# Patient Record
Sex: Female | Born: 1950
Health system: Southern US, Community
[De-identification: ages and names within clinical notes are randomized; demographics above are authoritative.]

## PROBLEM LIST (undated history)

## (undated) DIAGNOSIS — M1990 Unspecified osteoarthritis, unspecified site: Secondary | ICD-10-CM

## (undated) DIAGNOSIS — L97921 Non-pressure chronic ulcer of unspecified part of left lower leg limited to breakdown of skin: Secondary | ICD-10-CM

## (undated) DIAGNOSIS — C679 Malignant neoplasm of bladder, unspecified: Secondary | ICD-10-CM

## (undated) DIAGNOSIS — L089 Local infection of the skin and subcutaneous tissue, unspecified: Secondary | ICD-10-CM

## (undated) DIAGNOSIS — Z86718 Personal history of other venous thrombosis and embolism: Secondary | ICD-10-CM

## (undated) HISTORY — PX: ABDOMINAL HYSTERECTOMY: SHX81

## (undated) HISTORY — DX: Non-pressure chronic ulcer of unspecified part of left lower leg limited to breakdown of skin: L97.921

## (undated) HISTORY — DX: Local infection of the skin and subcutaneous tissue, unspecified: L08.9

## (undated) HISTORY — DX: Personal history of other venous thrombosis and embolism: Z86.718

---

## 1986-05-14 DIAGNOSIS — Z9079 Acquired absence of other genital organ(s): Secondary | ICD-10-CM | POA: Insufficient documentation

## 1987-05-15 DIAGNOSIS — Z86718 Personal history of other venous thrombosis and embolism: Secondary | ICD-10-CM

## 1999-03-03 ENCOUNTER — Encounter: Payer: Self-pay | Admitting: Emergency Medicine

## 1999-03-03 ENCOUNTER — Emergency Department (HOSPITAL_COMMUNITY): Admission: EM | Admit: 1999-03-03 | Discharge: 1999-03-03 | Payer: Self-pay | Admitting: Emergency Medicine

## 1999-09-17 ENCOUNTER — Emergency Department (HOSPITAL_COMMUNITY): Admission: EM | Admit: 1999-09-17 | Discharge: 1999-09-17 | Payer: Self-pay | Admitting: Emergency Medicine

## 2001-05-20 DIAGNOSIS — S62109A Fracture of unspecified carpal bone, unspecified wrist, initial encounter for closed fracture: Secondary | ICD-10-CM | POA: Insufficient documentation

## 2001-11-23 ENCOUNTER — Emergency Department (HOSPITAL_COMMUNITY): Admission: EM | Admit: 2001-11-23 | Discharge: 2001-11-23 | Payer: Self-pay | Admitting: Emergency Medicine

## 2001-11-23 ENCOUNTER — Encounter: Payer: Self-pay | Admitting: Emergency Medicine

## 2003-10-25 ENCOUNTER — Ambulatory Visit (HOSPITAL_COMMUNITY): Admission: RE | Admit: 2003-10-25 | Discharge: 2003-10-25 | Payer: Self-pay | Admitting: Family Medicine

## 2003-11-08 ENCOUNTER — Emergency Department (HOSPITAL_COMMUNITY): Admission: EM | Admit: 2003-11-08 | Discharge: 2003-11-08 | Payer: Self-pay | Admitting: Family Medicine

## 2004-03-06 ENCOUNTER — Ambulatory Visit: Payer: Self-pay | Admitting: Family Medicine

## 2004-03-06 ENCOUNTER — Ambulatory Visit: Payer: Self-pay | Admitting: *Deleted

## 2004-04-03 ENCOUNTER — Ambulatory Visit: Payer: Self-pay | Admitting: Family Medicine

## 2004-05-09 ENCOUNTER — Ambulatory Visit: Payer: Self-pay | Admitting: Family Medicine

## 2004-06-05 ENCOUNTER — Ambulatory Visit: Payer: Self-pay | Admitting: Family Medicine

## 2004-07-17 ENCOUNTER — Ambulatory Visit: Payer: Self-pay | Admitting: Family Medicine

## 2004-07-24 ENCOUNTER — Ambulatory Visit (HOSPITAL_COMMUNITY): Admission: RE | Admit: 2004-07-24 | Discharge: 2004-07-24 | Payer: Self-pay | Admitting: Family Medicine

## 2004-07-24 ENCOUNTER — Emergency Department (HOSPITAL_COMMUNITY): Admission: EM | Admit: 2004-07-24 | Discharge: 2004-07-24 | Payer: Self-pay | Admitting: Family Medicine

## 2004-07-25 ENCOUNTER — Ambulatory Visit (HOSPITAL_COMMUNITY): Admission: RE | Admit: 2004-07-25 | Discharge: 2004-07-25 | Payer: Self-pay | Admitting: Family Medicine

## 2004-08-07 ENCOUNTER — Emergency Department (HOSPITAL_COMMUNITY): Admission: EM | Admit: 2004-08-07 | Discharge: 2004-08-07 | Payer: Self-pay | Admitting: Family Medicine

## 2004-08-14 ENCOUNTER — Ambulatory Visit: Payer: Self-pay | Admitting: Family Medicine

## 2004-08-17 ENCOUNTER — Ambulatory Visit (HOSPITAL_COMMUNITY): Admission: RE | Admit: 2004-08-17 | Discharge: 2004-08-17 | Payer: Self-pay | Admitting: Family Medicine

## 2004-08-31 ENCOUNTER — Ambulatory Visit: Payer: Self-pay | Admitting: Nurse Practitioner

## 2004-09-11 ENCOUNTER — Ambulatory Visit: Payer: Self-pay | Admitting: Family Medicine

## 2004-10-16 ENCOUNTER — Ambulatory Visit: Payer: Self-pay | Admitting: Family Medicine

## 2004-11-13 ENCOUNTER — Ambulatory Visit: Payer: Self-pay | Admitting: Family Medicine

## 2004-12-18 ENCOUNTER — Ambulatory Visit: Payer: Self-pay | Admitting: Family Medicine

## 2005-01-22 ENCOUNTER — Ambulatory Visit: Payer: Self-pay | Admitting: Family Medicine

## 2005-02-12 ENCOUNTER — Ambulatory Visit: Payer: Self-pay | Admitting: Nurse Practitioner

## 2005-02-26 ENCOUNTER — Ambulatory Visit: Payer: Self-pay | Admitting: Family Medicine

## 2005-03-12 ENCOUNTER — Ambulatory Visit: Payer: Self-pay | Admitting: Family Medicine

## 2005-04-03 ENCOUNTER — Ambulatory Visit: Payer: Self-pay | Admitting: Family Medicine

## 2005-04-09 ENCOUNTER — Ambulatory Visit: Payer: Self-pay | Admitting: Family Medicine

## 2005-04-23 ENCOUNTER — Ambulatory Visit: Payer: Self-pay | Admitting: Internal Medicine

## 2005-05-21 ENCOUNTER — Ambulatory Visit: Payer: Self-pay | Admitting: Family Medicine

## 2005-05-28 ENCOUNTER — Ambulatory Visit: Payer: Self-pay | Admitting: Family Medicine

## 2005-06-11 ENCOUNTER — Ambulatory Visit: Payer: Self-pay | Admitting: Family Medicine

## 2005-07-17 ENCOUNTER — Ambulatory Visit: Payer: Self-pay | Admitting: Family Medicine

## 2005-08-20 ENCOUNTER — Ambulatory Visit: Payer: Self-pay | Admitting: Family Medicine

## 2005-08-20 ENCOUNTER — Encounter (INDEPENDENT_AMBULATORY_CARE_PROVIDER_SITE_OTHER): Payer: Self-pay | Admitting: Family Medicine

## 2005-08-27 ENCOUNTER — Ambulatory Visit: Payer: Self-pay | Admitting: Family Medicine

## 2005-09-10 ENCOUNTER — Ambulatory Visit (HOSPITAL_COMMUNITY): Admission: RE | Admit: 2005-09-10 | Discharge: 2005-09-10 | Payer: Self-pay | Admitting: Family Medicine

## 2005-09-10 ENCOUNTER — Ambulatory Visit: Payer: Self-pay | Admitting: Internal Medicine

## 2005-10-01 ENCOUNTER — Ambulatory Visit: Payer: Self-pay | Admitting: Internal Medicine

## 2005-10-29 ENCOUNTER — Ambulatory Visit: Payer: Self-pay | Admitting: Internal Medicine

## 2005-11-11 ENCOUNTER — Encounter (INDEPENDENT_AMBULATORY_CARE_PROVIDER_SITE_OTHER): Payer: Self-pay | Admitting: Family Medicine

## 2005-11-11 LAB — CONVERTED CEMR LAB: Pap Smear: NORMAL

## 2005-11-26 ENCOUNTER — Ambulatory Visit: Payer: Self-pay | Admitting: Family Medicine

## 2005-12-24 ENCOUNTER — Ambulatory Visit: Payer: Self-pay | Admitting: Internal Medicine

## 2006-02-18 ENCOUNTER — Ambulatory Visit: Payer: Self-pay | Admitting: Family Medicine

## 2006-03-04 ENCOUNTER — Ambulatory Visit: Payer: Self-pay | Admitting: Family Medicine

## 2006-03-18 ENCOUNTER — Ambulatory Visit: Payer: Self-pay | Admitting: Family Medicine

## 2006-04-15 ENCOUNTER — Ambulatory Visit: Payer: Self-pay | Admitting: Family Medicine

## 2006-05-27 ENCOUNTER — Ambulatory Visit: Payer: Self-pay | Admitting: Family Medicine

## 2006-06-24 ENCOUNTER — Ambulatory Visit: Payer: Self-pay | Admitting: Family Medicine

## 2006-07-22 ENCOUNTER — Ambulatory Visit: Payer: Self-pay | Admitting: Family Medicine

## 2006-08-19 ENCOUNTER — Ambulatory Visit: Payer: Self-pay | Admitting: Family Medicine

## 2006-09-16 ENCOUNTER — Ambulatory Visit: Payer: Self-pay | Admitting: Family Medicine

## 2006-10-03 ENCOUNTER — Encounter (INDEPENDENT_AMBULATORY_CARE_PROVIDER_SITE_OTHER): Payer: Self-pay | Admitting: Family Medicine

## 2006-10-03 DIAGNOSIS — M1990 Unspecified osteoarthritis, unspecified site: Secondary | ICD-10-CM

## 2007-01-29 ENCOUNTER — Encounter (INDEPENDENT_AMBULATORY_CARE_PROVIDER_SITE_OTHER): Payer: Self-pay | Admitting: *Deleted

## 2007-02-06 ENCOUNTER — Emergency Department (HOSPITAL_COMMUNITY): Admission: EM | Admit: 2007-02-06 | Discharge: 2007-02-06 | Payer: Self-pay | Admitting: Emergency Medicine

## 2007-12-30 ENCOUNTER — Ambulatory Visit (HOSPITAL_COMMUNITY): Admission: RE | Admit: 2007-12-30 | Discharge: 2007-12-30 | Payer: Self-pay | Admitting: Urology

## 2008-02-21 ENCOUNTER — Emergency Department (HOSPITAL_COMMUNITY): Admission: EM | Admit: 2008-02-21 | Discharge: 2008-02-21 | Payer: Self-pay | Admitting: Emergency Medicine

## 2008-04-27 ENCOUNTER — Ambulatory Visit (HOSPITAL_COMMUNITY): Admission: RE | Admit: 2008-04-27 | Discharge: 2008-04-27 | Payer: Self-pay | Admitting: Sports Medicine

## 2008-05-05 ENCOUNTER — Ambulatory Visit (HOSPITAL_COMMUNITY): Admission: RE | Admit: 2008-05-05 | Discharge: 2008-05-05 | Payer: Self-pay | Admitting: Sports Medicine

## 2008-05-28 ENCOUNTER — Inpatient Hospital Stay (HOSPITAL_COMMUNITY): Admission: AD | Admit: 2008-05-28 | Discharge: 2008-05-28 | Payer: Self-pay | Admitting: Obstetrics & Gynecology

## 2008-05-28 ENCOUNTER — Ambulatory Visit: Payer: Self-pay | Admitting: Physician Assistant

## 2008-05-30 ENCOUNTER — Emergency Department (HOSPITAL_COMMUNITY): Admission: EM | Admit: 2008-05-30 | Discharge: 2008-05-30 | Payer: Self-pay | Admitting: Emergency Medicine

## 2008-06-28 ENCOUNTER — Encounter (INDEPENDENT_AMBULATORY_CARE_PROVIDER_SITE_OTHER): Payer: Self-pay | Admitting: Urology

## 2008-06-28 ENCOUNTER — Ambulatory Visit (HOSPITAL_COMMUNITY): Admission: RE | Admit: 2008-06-28 | Discharge: 2008-06-28 | Payer: Self-pay | Admitting: Urology

## 2009-05-21 ENCOUNTER — Emergency Department (HOSPITAL_COMMUNITY): Admission: EM | Admit: 2009-05-21 | Discharge: 2009-05-21 | Payer: Self-pay | Admitting: Family Medicine

## 2009-06-06 ENCOUNTER — Emergency Department (HOSPITAL_COMMUNITY): Admission: EM | Admit: 2009-06-06 | Discharge: 2009-06-06 | Payer: Self-pay | Admitting: Emergency Medicine

## 2009-06-11 ENCOUNTER — Emergency Department (HOSPITAL_COMMUNITY): Admission: EM | Admit: 2009-06-11 | Discharge: 2009-06-11 | Payer: Self-pay | Admitting: Family Medicine

## 2010-06-04 ENCOUNTER — Encounter: Payer: Self-pay | Admitting: Family Medicine

## 2010-06-04 ENCOUNTER — Encounter: Payer: Self-pay | Admitting: Oncology

## 2010-06-30 IMAGING — NM NM BONE 3 PHASE
2 series · 2 of 2 positions shown · non-contrast
Comparison: None

CLINICAL DATA: mid systemic arthropathy.  Bilateral foot pain.
Assess for stress fracture.

NUCLEAR MEDICINE THREE PHASE BONE SCAN
TECHNIQUE: After intravenous administration of radiopharmeceutical,
immediate flow images were performed by immediate / early static
images and approximate three hour delayed static images.
Radiopharmaceutical: 25.3 mCi technetium MDP IV

[3 phase bone · 2.33mm/px · 1 of 1 slices shown (1 of 2)]
[im 1/1]
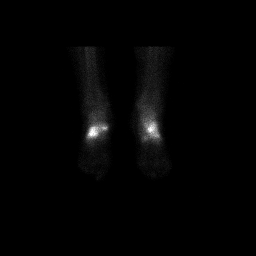

[3 phase bone · 2.33mm/px · 1 of 1 slices shown (2 of 2)]
[im 1/1]
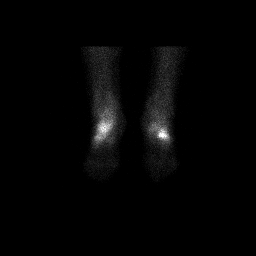

[2 of 2 positions shown; findings below may reference images not displayed]

FINDINGS: Increased tracer activity involving the mid feet on the
dynamic flow, early blood pool, and delayed static images.  The
tracer activity involves the tarsal regions and may involve the
anterior aspect of the left calcaneus as well.  No significant
asymmetrical tracer activity to suggest a stress fracture.  I do
not have plain radiographs for correlation at this time.
IMPRESSION: Bilateral rather symmetrical increased tracer accumulation
involving the tarsal regions of the feet.  No findings to strongly
suggest presence of a stress fracture.

## 2010-08-28 LAB — URINALYSIS, ROUTINE W REFLEX MICROSCOPIC
Ketones, ur: NEGATIVE mg/dL
Nitrite: NEGATIVE
Nitrite: POSITIVE — AB
Protein, ur: 100 mg/dL — AB
Specific Gravity, Urine: <1.005 — ABNORMAL LOW (ref 1.005–1.030)
Specific Gravity, Urine: 1.017 (ref 1.005–1.030)
Urobilinogen, UA: 1 mg/dL (ref 0.0–1.0)
pH: 8 (ref 5.0–8.0)

## 2010-08-28 LAB — POCT I-STAT, CHEM 8
BUN: 37 mg/dL — ABNORMAL HIGH (ref 6–23)
Chloride: 106 mEq/L (ref 96–112)
Glucose, Bld: 98 mg/dL (ref 70–99)
HCT: 39 % (ref 36.0–46.0)
Potassium: 4 mEq/L (ref 3.5–5.1)

## 2010-08-28 LAB — PROTIME-INR: INR: 1.1 (ref 0.00–1.49)

## 2010-08-28 LAB — CBC
Hemoglobin: 12.6 g/dL (ref 12.0–15.0)
MCHC: 32.5 g/dL (ref 30.0–36.0)
MCV: 93.6 fL (ref 78.0–100.0)
RBC: 4.15 MIL/uL (ref 3.87–5.11)
WBC: 8.2 10*3/uL (ref 4.0–10.5)

## 2010-08-28 LAB — URINE MICROSCOPIC-ADD ON

## 2010-08-28 LAB — DIFFERENTIAL
Eosinophils Absolute: 0.1 10*3/uL (ref 0.0–0.7)
Lymphs Abs: 2.3 10*3/uL (ref 0.7–4.0)
Monocytes Absolute: 0.6 10*3/uL (ref 0.1–1.0)
Monocytes Relative: 7 % (ref 3–12)
Neutrophils Relative %: 63 % (ref 43–77)

## 2010-08-28 LAB — URINE CULTURE: Colony Count: >=100000

## 2010-08-28 LAB — GC/CHLAMYDIA PROBE AMP, GENITAL
Chlamydia, DNA Probe: NEGATIVE
GC Probe Amp, Genital: NEGATIVE

## 2010-08-28 LAB — APTT: aPTT: 30 seconds (ref 24–37)

## 2010-08-28 LAB — WET PREP, GENITAL: Yeast Wet Prep HPF POC: NONE SEEN

## 2010-08-29 LAB — HEMOGLOBIN AND HEMATOCRIT, BLOOD: Hemoglobin: 13.1 g/dL (ref 12.0–15.0)

## 2010-09-26 NOTE — Op Note (Signed)
NAMESUETTA, Shelia Berg NO.:  [id number]   MEDICAL RECORD NO.:  [id number]          PATIENT TYPE:  AMB   LOCATION:  DAY                          FACILITY:  [facility]   PHYSICIAN:  Heloise Purpura, MD      DATE OF BIRTH:  02/19/1951   DATE OF PROCEDURE:  06/28/2008  DATE OF DISCHARGE:                               OPERATIVE REPORT   PREOPERATIVE DIAGNOSES:  1. Bladder tumor.  2. Hematuria.   POSTOPERATIVE DIAGNOSES:  1. Bladder tumor.  2. Hematuria.   PROCEDURES:  1. Cystoscopy.  2. Pelvic exam under anesthesia.  3. Saline bladder washing for cytology.  4. Bilateral retrograde pyelography.  5. Transurethral resection of bladder tumor (3 cm), bladder biopsies      from left lateral bladder wall and posterior bladder wall.   SURGEON:  Dr. Heloise Purpura.   ANESTHESIA:  General.   COMPLICATIONS:  None.   ESTIMATED BLOOD LOSS:  25 mL.   SPECIMENS:  1. Right lateral bladder tumor.  2. Biopsies deep margin of right lateral bladder tumor.  3. Left lateral bladder biopsy.  4. Posterior bladder biopsy.  5. Bladder washing for cytology.   DISPOSITION OF SPECIMENS:  To pathology.   DRAIN:  16-French Foley catheter.   INDICATION:  Shelia Berg is a 60 year old female who presented with gross  hematuria.  She underwent cystoscopy by Dr. Alfredo Martinez and was  noted to have a right lateral bladder tumor.  She was not able to go and  undergo a contrasted upper tract imaging study due to her history of an  IV contrast allergy.  After discussion regarding the potential risks,  complications, and alternative treatment options associated with the  above procedures, she consented to proceed for therapeutic and further  staging procedures as discussed above.  Informed consent was obtained.   DESCRIPTION OF PROCEDURE:  The patient was taken to the operating room  and a general anesthetic was administered.  She was paralyzed.  She was  given preoperative antibiotics, placed  in the dorsal lithotomy position,  prepped and draped in the usual sterile fashion.  Next preoperative time-  out was performed.  Cystourethroscopy was then performed with both the  12 and 70 degrees lenses.  This demonstrated a partially necrotic  papillary bladder tumor off the right lateral wall of the bladder.  In  addition, there was noted to be an area of the flat and slightly raised  erythematous tissue off the left lateral bladder wall as well as  posterior bladder wall.  No other obvious tumors were identified.  The  ureteral orifices were in normal anatomic position and the effluxing  clear urine.  A saline bladder washing was then obtained and sent for  cytology.  The right ureteral orifice was then intubated with a 6-French  ureteral catheter and Omnipaque contrast was injected with care not to  use excessive pressure considering her IV contrast allergy.  This  demonstrated a mildly dilated ureter without evidence of any filling  defects within the ureter or renal collecting system.  Attention then  turned to the  left ureteral orifice which was also intubated with a 6-  French ureteral catheter.  Again Omnipaque was injected and this  demonstrated a normal caliber ureter without evidence of hydronephrosis.  There were no filling defects within the left renal collecting system or  ureter.  Attention then returned to the bladder and the cystoscope was  removed.  A 26-French resectoscope was then placed into the bladder with  ease.  With water irrigation was utilized and transurethral resection of  the patient's right lateral bladder tumor was performed after it was  confirmed that she was indeed paralyzed.  The bladder tumor was removed  and sent for analysis.  A cold cup biopsy forceps was then used for  biopsies of the tumor base.  This area was then fulgurated with the  electrocautery loop.  The previously mentioned abnormal areas on the  left lateral bladder wall as well as  posterior bladder wall were also  biopsied multiple times with the cold cup biopsy forceps and these areas  were then again fulgurated with electrocautery loop.  The bladder was  emptied and reinspected ensuring hemostasis.  At this point the bladder  was emptied and a separate gloved finger was placed into the vagina and  pelvic exam under anesthesia was performed.  There was noted be some  thickening of the right lateral bladder wall though no definite mass was  noted and the bladder was freely mobile.  A 16-French Foley catheter was  then placed into the bladder.  40 mg of mitomycin in 40 mL of sterile  water was then instilled into the patient's bladder and a catheter plug  was placed.  She tolerated the procedure well without complications.  She was able to be awakened and transferred to recovery unit in  satisfactory condition.      Heloise Purpura, MD  Electronically Signed     LB/MEDQ  D:  06/28/2008  T:  06/28/2008  Job:  [id number]

## 2011-02-12 LAB — URINALYSIS, ROUTINE W REFLEX MICROSCOPIC
Leukocytes, UA: NEGATIVE
Protein, ur: 30 — AB
Specific Gravity, Urine: 1.023
Urobilinogen, UA: 0.2

## 2011-02-12 LAB — PROTIME-INR
INR: 2.1 — ABNORMAL HIGH
Prothrombin Time: 24.6 — ABNORMAL HIGH

## 2011-02-12 LAB — DIFFERENTIAL
Lymphs Abs: 1.9
Monocytes Relative: 6
Neutro Abs: 3
Neutrophils Relative %: 56

## 2011-02-12 LAB — URINE MICROSCOPIC-ADD ON

## 2011-02-12 LAB — COMPREHENSIVE METABOLIC PANEL
Albumin: 3.3 — ABNORMAL LOW
BUN: 20
Calcium: 8.6
Creatinine, Ser: 0.82
Total Bilirubin: 0.6
Total Protein: 6.2

## 2011-02-12 LAB — CBC
HCT: 36.7
MCHC: 33.3
MCV: 93.1
Platelets: 215
RDW: 13.3

## 2011-02-22 LAB — PROTIME-INR
INR: 2.2 — ABNORMAL HIGH
Prothrombin Time: 25.2 — ABNORMAL HIGH

## 2011-02-22 LAB — APTT: aPTT: 46 — ABNORMAL HIGH

## 2011-06-07 ENCOUNTER — Other Ambulatory Visit (HOSPITAL_COMMUNITY): Payer: Self-pay | Admitting: Urology

## 2011-06-07 DIAGNOSIS — C689 Malignant neoplasm of urinary organ, unspecified: Secondary | ICD-10-CM

## 2011-06-11 ENCOUNTER — Inpatient Hospital Stay (HOSPITAL_COMMUNITY): Admission: RE | Admit: 2011-06-11 | Payer: Self-pay | Source: Ambulatory Visit

## 2011-06-12 ENCOUNTER — Inpatient Hospital Stay (HOSPITAL_COMMUNITY): Admission: RE | Admit: 2011-06-12 | Payer: Self-pay | Source: Ambulatory Visit

## 2011-06-13 ENCOUNTER — Ambulatory Visit (HOSPITAL_COMMUNITY)
Admission: RE | Admit: 2011-06-13 | Discharge: 2011-06-13 | Disposition: A | Discharge status: Home / Self Care | Payer: Self-pay | Source: Ambulatory Visit | Attending: Urology | Admitting: Urology

## 2011-06-13 ENCOUNTER — Other Ambulatory Visit (HOSPITAL_COMMUNITY): Payer: Self-pay | Admitting: Urology

## 2011-06-13 DIAGNOSIS — C689 Malignant neoplasm of urinary organ, unspecified: Secondary | ICD-10-CM

## 2011-06-13 DIAGNOSIS — C679 Malignant neoplasm of bladder, unspecified: Secondary | ICD-10-CM | POA: Insufficient documentation

## 2011-06-13 DIAGNOSIS — Z90710 Acquired absence of both cervix and uterus: Secondary | ICD-10-CM | POA: Insufficient documentation

## 2011-06-13 DIAGNOSIS — N323 Diverticulum of bladder: Secondary | ICD-10-CM | POA: Insufficient documentation

## 2011-06-13 MED ORDER — FUROSEMIDE 10 MG/ML IJ SOLN
5.0000 mg | Freq: Once | INTRAMUSCULAR | Status: DC
  Filled 2011-06-13: qty 0.5

## 2013-03-25 ENCOUNTER — Ambulatory Visit (INDEPENDENT_AMBULATORY_CARE_PROVIDER_SITE_OTHER): Payer: BC Managed Care – PPO | Admitting: Physician Assistant

## 2013-03-25 VITALS — BP 124/72 | HR 78 | Temp 97.9°F | Resp 18 | Ht 62.25 in | Wt 143.2 lb

## 2013-03-25 DIAGNOSIS — J329 Chronic sinusitis, unspecified: Secondary | ICD-10-CM

## 2013-03-25 MED ORDER — GUAIFENESIN ER 1200 MG PO TB12: 1.0000 | ORAL_TABLET | Freq: Two times a day (BID) | ORAL | Status: AC

## 2013-03-25 MED ORDER — AMOXICILLIN 875 MG PO TABS: 1750.0000 mg | ORAL_TABLET | Freq: Two times a day (BID) | ORAL | Status: DC

## 2013-03-25 NOTE — Progress Notes (Signed)
    Subjective:    Patient ID: Shelia Berg, female    DOB: 10/19/1950, 62 y.o.   MRN: [id number]  HPI Pt presents to clinic with face pain, teeth pain and facial swelling that started 2 days ago.  2 weeks ago she had a cold that she used Mucinex for and her symptoms almost completely went away and then she started to develop facial pain and pressure - she wears dentures and is unable to wear them because of pain.    Review of Systems  Constitutional: Negative for fever and chills.  HENT: Positive for facial swelling and sinus pressure. Negative for congestion and rhinorrhea.   Neurological: Positive for headaches.       Objective:   Physical Exam  Vitals reviewed. Constitutional: She is oriented to person, place, and time. She appears well-developed and well-nourished.  HENT:  Head: Normocephalic and atraumatic.  Right Ear: Hearing, tympanic membrane, external ear and ear canal normal.  Left Ear: Hearing, tympanic membrane, external ear and ear canal normal.  Nose: Right sinus exhibits maxillary sinus tenderness and frontal sinus tenderness. Left sinus exhibits maxillary sinus tenderness and frontal sinus tenderness.  Eyes: Conjunctivae are normal.  Neck: Normal range of motion.  Cardiovascular: Normal rate, regular rhythm and normal heart sounds.   No murmur heard. Pulmonary/Chest: Effort normal and breath sounds normal. She has no wheezes.  Lymphadenopathy:    She has cervical adenopathy (Ac enalgred and TTP).  Neurological: She is alert and oriented to person, place, and time.  Skin: Skin is warm and dry.    Assessment & Plan:  Sinusitis - Plan: amoxicillin (AMOXIL) 875 MG tablet, Guaifenesin (MUCINEX MAXIMUM STRENGTH) 1200 MG TB12  Push fluids.  Benny Lennert PA-C 03/25/2013 9:20 PM

## 2013-08-01 ENCOUNTER — Ambulatory Visit (INDEPENDENT_AMBULATORY_CARE_PROVIDER_SITE_OTHER): Payer: BC Managed Care – PPO | Admitting: Family Medicine

## 2013-08-01 ENCOUNTER — Ambulatory Visit: Payer: BC Managed Care – PPO

## 2013-08-01 VITALS — BP 126/68 | HR 88 | Temp 98.2°F | Resp 16 | Wt 147.0 lb

## 2013-08-01 DIAGNOSIS — M79671 Pain in right foot: Secondary | ICD-10-CM

## 2013-08-01 DIAGNOSIS — M779 Enthesopathy, unspecified: Secondary | ICD-10-CM

## 2013-08-01 DIAGNOSIS — M79609 Pain in unspecified limb: Secondary | ICD-10-CM

## 2013-08-01 MED ORDER — PREDNISONE 20 MG PO TABS: 40.0000 mg | ORAL_TABLET | Freq: Every day | ORAL | Status: DC

## 2013-08-01 NOTE — Patient Instructions (Signed)
Tendinitis °Tendinitis is swelling and inflammation of the tendons. Tendons are band-like tissues that connect muscle to bone. Tendinitis commonly occurs in the:  °· Shoulders (rotator cuff). °· Heels (Achilles tendon). °· Elbows (triceps tendon). °CAUSES °Tendinitis is usually caused by overusing the tendon, muscles, and joints involved. When the tissue surrounding a tendon (synovium) becomes inflamed, it is called tenosynovitis. Tendinitis commonly develops in people whose jobs require repetitive motions. °SYMPTOMS °· Pain. °· Tenderness. °· Mild swelling. °DIAGNOSIS °Tendinitis is usually diagnosed by physical exam. Your caregiver may also order X-rays or other imaging tests. °TREATMENT °Your caregiver may recommend certain medicines or exercises for your treatment. °HOME CARE INSTRUCTIONS  °· Use a sling or splint for as long as directed by your caregiver until the pain decreases. °· Put ice on the injured area. °· Put ice in a plastic bag. °· Place a towel between your skin and the bag. °· Leave the ice on for 15-20 minutes, 03-04 times a day. °· Avoid using the limb while the tendon is painful. Perform gentle range of motion exercises only as directed by your caregiver. Stop exercises if pain or discomfort increase, unless directed otherwise by your caregiver. °· Only take over-the-counter or prescription medicines for pain, discomfort, or fever as directed by your caregiver. °SEEK MEDICAL CARE IF:  °· Your pain and swelling increase. °· You develop new, unexplained symptoms, especially increased numbness in the hands. °MAKE SURE YOU:  °· Understand these instructions. °· Will watch your condition. °· Will get help right away if you are not doing well or get worse. °Document Released: 04/27/2000 Document Revised: 07/23/2011 Document Reviewed: 07/17/2010 °Exit[employer]® Patient Information ©2014 [employer]. ° °

## 2013-08-01 NOTE — Progress Notes (Signed)
This chart was scribed for Robyn Haber, MD by Vernell Barrier, Medical Scribe. This patient's care was started at 5:56 PM.  Patient ID: Shelia Berg MRN: [id number], DOB: 21-Oct-1950, 63 y.o. Date of Encounter: 08/01/2013, 5:56 PM  Primary Physician: [name] Patient  Chief Complaint: right foot pain  HPI: 63 y.o. year old female with history below presents with right foot pain and associated swelling, onset 5 days ago. Pain worse with walking. No pain with palpation. States this pain has occurred before and went away with ibuprofen. this time pain did not subside even with ibuprofen but reports swelling did go down slightly. Pt also reports she had dental surgery 2 weeks ago. Pt works as a Librarian, academic in a hotel. Pt is from [place].   No past medical history on file.   Home Meds: Prior to Admission medications   Medication Sig Start Date End Date Taking? Authorizing Provider  Ascorbic Acid (VITAMIN C) 100 MG tablet Take 100 mg by mouth daily.   Yes Historical Provider, MD  aspirin 81 MG tablet Take 81 mg by mouth daily.   Yes Historical Provider, MD    Allergies: No Known Allergies  History   Social History   Marital Status: Married    Spouse Name: N/A    Number of Children: N/A   Years of Education: N/A   Occupational History   Not on file.   Social History Main Topics   Smoking status: Current Every Day Smoker   Smokeless tobacco: Not on file   Alcohol Use: No   Drug Use: No   Sexual Activity: Yes   Other Topics Concern   Not on file   Social History Narrative   No narrative on file     Review of Systems: Constitutional: negative for chills, fever, night sweats, weight changes, or fatigue  HEENT: negative for vision changes, hearing loss, congestion, rhinorrhea, ST, epistaxis, or sinus pressure Cardiovascular: negative for chest pain or palpitations Respiratory: negative for hemoptysis, wheezing, shortness of breath, or cough Abdominal: negative  for abdominal pain, nausea, vomiting, diarrhea, or constipation Dermatological: negative for rash Neurologic: negative for headache, dizziness, or syncope Musc: right foot pain and swelling All other systems reviewed and are otherwise negative with the exception to those above and in the HPI.   Physical Exam: Blood pressure 126/68, pulse 88, temperature 98.2 F (36.8 C), temperature source Oral, resp. rate 16, weight 147 lb (66.679 kg), SpO2 97.00%., Body mass index is 26.68 kg/(m^2). General: Well developed, well nourished, in no acute distress. Head: Normocephalic, atraumatic, eyes without discharge, sclera non-icteric, nares are without discharge. Bilateral auditory canals clear, TM's are without perforation, pearly grey and translucent with reflective cone of light bilaterally. Oral cavity moist, posterior pharynx without exudate, erythema, peritonsillar abscess, or post nasal drip.  Neck: Supple. No thyromegaly. Full ROM. No lymphadenopathy. Lungs: Clear bilaterally to auscultation without wheezes, rales, or rhonchi. Breathing is unlabored. Heart: RRR with S1 S2. No murmurs, rubs, or gallops appreciated. Abdomen: Soft, non-tender, non-distended with normoactive bowel sounds. No hepatomegaly. No rebound/guarding. No obvious abdominal masses. Msk:  Strength and tone normal for age. Extremities/Skin: Warm and dry. No clubbing or cyanosis. No edema. No rashes or suspicious lesions. Neuro: Alert and oriented X 3. Moves all extremities spontaneously. Gait is normal. CNII-XII grossly in tact. Psych:  Responds to questions appropriately with a normal affect.  [facility] reading (PRIMARY) by  Dr. Joseph Art:  Negative right foot.    ASSESSMENT AND PLAN:  63 y.o. year  old female with Foot pain, right - Plan: DG Foot Complete Right, predniSONE (DELTASONE) 20 MG tablet  Tendinitis - Plan: predniSONE (DELTASONE) 20 MG tablet     Signed, Robyn Haber, MD 08/01/2013 5:56 PM

## 2013-08-18 ENCOUNTER — Telehealth: Payer: Self-pay | Admitting: *Deleted

## 2013-08-18 ENCOUNTER — Other Ambulatory Visit: Payer: Self-pay | Admitting: Family Medicine

## 2013-08-18 DIAGNOSIS — M79671 Pain in right foot: Secondary | ICD-10-CM

## 2013-08-18 DIAGNOSIS — M779 Enthesopathy, unspecified: Secondary | ICD-10-CM

## 2013-08-18 MED ORDER — PREDNISONE 20 MG PO TABS: 40.0000 mg | ORAL_TABLET | Freq: Every day | ORAL | Status: DC

## 2013-08-18 NOTE — Telephone Encounter (Signed)
Pt stated she would like a refill on prednisone.  She is still in pain.  Would you like for pt to follow up or can we refill

## 2013-08-18 NOTE — Telephone Encounter (Signed)
Phone number out of service, but RF was sent to pharm

## 2013-08-18 NOTE — Telephone Encounter (Signed)
I ordered a refill on the prednisone

## 2014-06-06 ENCOUNTER — Ambulatory Visit: Payer: 59

## 2014-06-07 ENCOUNTER — Encounter (HOSPITAL_COMMUNITY): Payer: Self-pay | Admitting: Emergency Medicine

## 2014-06-07 ENCOUNTER — Inpatient Hospital Stay (HOSPITAL_COMMUNITY)
Admission: EM | Admit: 2014-06-07 | Discharge: 2014-06-10 | DRG: 190 | Disposition: A | Discharge status: Home / Self Care | Payer: 59 | Attending: Internal Medicine | Admitting: Internal Medicine

## 2014-06-07 ENCOUNTER — Emergency Department (HOSPITAL_COMMUNITY): Payer: Self-pay

## 2014-06-07 DIAGNOSIS — J189 Pneumonia, unspecified organism: Secondary | ICD-10-CM

## 2014-06-07 DIAGNOSIS — J154 Pneumonia due to other streptococci: Secondary | ICD-10-CM | POA: Diagnosis present

## 2014-06-07 DIAGNOSIS — F17210 Nicotine dependence, cigarettes, uncomplicated: Secondary | ICD-10-CM | POA: Diagnosis present

## 2014-06-07 DIAGNOSIS — E876 Hypokalemia: Secondary | ICD-10-CM

## 2014-06-07 DIAGNOSIS — R079 Chest pain, unspecified: Secondary | ICD-10-CM

## 2014-06-07 DIAGNOSIS — Z8551 Personal history of malignant neoplasm of bladder: Secondary | ICD-10-CM

## 2014-06-07 DIAGNOSIS — J441 Chronic obstructive pulmonary disease with (acute) exacerbation: Principal | ICD-10-CM | POA: Diagnosis present

## 2014-06-07 DIAGNOSIS — J9801 Acute bronchospasm: Secondary | ICD-10-CM | POA: Diagnosis present

## 2014-06-07 DIAGNOSIS — Z7982 Long term (current) use of aspirin: Secondary | ICD-10-CM

## 2014-06-07 HISTORY — DX: Malignant neoplasm of bladder, unspecified: C67.9

## 2014-06-07 LAB — CBC
HCT: 35.5 % — ABNORMAL LOW (ref 36.0–46.0)
HEMOGLOBIN: 11.6 g/dL — AB (ref 12.0–15.0)
MCH: 30.2 pg (ref 26.0–34.0)
MCHC: 32.7 g/dL (ref 30.0–36.0)
MCV: 92.4 fL (ref 78.0–100.0)
Platelets: 184 10*3/uL (ref 150–400)
RBC: 3.84 MIL/uL — ABNORMAL LOW (ref 3.87–5.11)
RDW: 12.2 % (ref 11.5–15.5)
WBC: 8.3 10*3/uL (ref 4.0–10.5)

## 2014-06-07 LAB — BRAIN NATRIURETIC PEPTIDE: B Natriuretic Peptide: 65.5 pg/mL (ref 0.0–100.0)

## 2014-06-07 LAB — BASIC METABOLIC PANEL
Anion gap: 10 (ref 5–15)
BUN: 10 mg/dL (ref 6–23)
CO2: 25 mmol/L (ref 19–32)
CREATININE: 1.04 mg/dL (ref 0.50–1.10)
Calcium: 8.3 mg/dL — ABNORMAL LOW (ref 8.4–10.5)
Chloride: 102 mmol/L (ref 96–112)
GFR calc Af Amer: 65 mL/min — ABNORMAL LOW (ref >90)
GFR calc non Af Amer: 56 mL/min — ABNORMAL LOW (ref >90)
GLUCOSE: 171 mg/dL — AB (ref 70–99)
POTASSIUM: 3.3 mmol/L — AB (ref 3.5–5.1)
Sodium: 137 mmol/L (ref 135–145)

## 2014-06-07 MED ORDER — POTASSIUM CHLORIDE CRYS ER 20 MEQ PO TBCR
40.0000 meq | EXTENDED_RELEASE_TABLET | Freq: Once | ORAL | Status: AC
  Administered 2014-06-07: 40 meq via ORAL
  Filled 2014-06-07: qty 2

## 2014-06-07 MED ORDER — IPRATROPIUM-ALBUTEROL 0.5-2.5 (3) MG/3ML IN SOLN
3.0000 mL | Freq: Four times a day (QID) | RESPIRATORY_TRACT | Status: DC
  Administered 2014-06-08: 3 mL via RESPIRATORY_TRACT
  Filled 2014-06-07: qty 3

## 2014-06-07 MED ORDER — IPRATROPIUM-ALBUTEROL 0.5-2.5 (3) MG/3ML IN SOLN
3.0000 mL | Freq: Once | RESPIRATORY_TRACT | Status: AC
  Administered 2014-06-07: 3 mL via RESPIRATORY_TRACT
  Filled 2014-06-07: qty 3

## 2014-06-07 MED ORDER — METHYLPREDNISOLONE SODIUM SUCC 125 MG IJ SOLR
125.0000 mg | Freq: Once | INTRAMUSCULAR | Status: AC
  Administered 2014-06-07: 125 mg via INTRAVENOUS
  Filled 2014-06-07: qty 2

## 2014-06-07 MED ORDER — CEFTRIAXONE SODIUM IN DEXTROSE 20 MG/ML IV SOLN
1.0000 g | Freq: Once | INTRAVENOUS | Status: AC
  Administered 2014-06-07: 1 g via INTRAVENOUS
  Filled 2014-06-07: qty 50

## 2014-06-07 MED ORDER — ASPIRIN EC 81 MG PO TBEC
81.0000 mg | DELAYED_RELEASE_TABLET | Freq: Every day | ORAL | Status: DC
  Administered 2014-06-08 – 2014-06-10 (×3): 81 mg via ORAL
  Filled 2014-06-07 (×3): qty 1

## 2014-06-07 MED ORDER — SODIUM CHLORIDE 0.9 % IV SOLN
INTRAVENOUS | Status: DC
  Administered 2014-06-07 – 2014-06-09 (×3): via INTRAVENOUS
  Administered 2014-06-10: 1000 mL via INTRAVENOUS

## 2014-06-07 MED ORDER — IPRATROPIUM-ALBUTEROL 0.5-2.5 (3) MG/3ML IN SOLN: 3.0000 mL | Freq: Four times a day (QID) | RESPIRATORY_TRACT | Status: DC | PRN

## 2014-06-07 MED ORDER — AZITHROMYCIN 500 MG PO TABS
500.0000 mg | ORAL_TABLET | ORAL | Status: DC
  Administered 2014-06-08 – 2014-06-09 (×2): 500 mg via ORAL
  Filled 2014-06-07 (×4): qty 1

## 2014-06-07 MED ORDER — ENOXAPARIN SODIUM 40 MG/0.4ML ~~LOC~~ SOLN
40.0000 mg | Freq: Every day | SUBCUTANEOUS | Status: DC
  Administered 2014-06-07 – 2014-06-09 (×3): 40 mg via SUBCUTANEOUS
  Filled 2014-06-07 (×4): qty 0.4

## 2014-06-07 MED ORDER — ALBUTEROL SULFATE (2.5 MG/3ML) 0.083% IN NEBU
2.5000 mg | INHALATION_SOLUTION | Freq: Once | RESPIRATORY_TRACT | Status: AC
  Administered 2014-06-07: 2.5 mg via RESPIRATORY_TRACT
  Filled 2014-06-07: qty 3

## 2014-06-07 MED ORDER — AZITHROMYCIN 250 MG PO TABS
500.0000 mg | ORAL_TABLET | Freq: Once | ORAL | Status: AC
  Administered 2014-06-07: 500 mg via ORAL
  Filled 2014-06-07: qty 2

## 2014-06-07 MED ORDER — DEXTROSE 5 % IV SOLN
1.0000 g | INTRAVENOUS | Status: DC
  Administered 2014-06-08 – 2014-06-09 (×2): 1 g via INTRAVENOUS
  Filled 2014-06-07 (×3): qty 10

## 2014-06-07 MED ORDER — SODIUM CHLORIDE 0.9 % IV BOLUS (SEPSIS)
500.0000 mL | Freq: Once | INTRAVENOUS | Status: AC
  Administered 2014-06-07: 500 mL via INTRAVENOUS

## 2014-06-07 NOTE — ED Notes (Signed)
Pt was sent from urgent care for PNA in Right middle lobe and for further evaluation due to poor response to 2 neb treatments.  Pt has been feeling bad x 3 days.

## 2014-06-07 NOTE — ED Provider Notes (Signed)
CSN: [id number]     Arrival date & time 06/07/14  1713 History   First MD Initiated Contact with Patient 06/07/14 1731     Chief Complaint  Patient presents with  . PNA sent by urgent care      (Consider location/radiation/quality/duration/timing/severity/associated sxs/prior Treatment) HPI  Pt presenting with co shortness of breath, cough, and low grade fever over the past 2 days.  She was seen at urgent care and found to have wheezing and diagnosed with RML pneumonia.  She was given rocephin IM just prior to arrival.  She was also given 2 neb treatments and had continued wheezing which prompted referral to the ED.  Pt has no hx of wheezing in the past.  No leg swelling.  No chest pain.  No sick contacts.  There are no other associated systemic symptoms, there are no other alleviating or modifying factors.   Past Medical History  Diagnosis Date  . Bladder cancer 6 years ago   Past Surgical History  Procedure Laterality Date  . Abdominal hysterectomy     No family history on file. History  Substance Use Topics  . Smoking status: Current Every Day Smoker -- 0.50 packs/day    Types: Cigarettes  . Smokeless tobacco: Not on file     Comment: Reports she stopped 1 week ago.   . Alcohol Use: No   OB History    No data available     Review of Systems  ROS reviewed and all otherwise negative except for mentioned in HPI    Allergies  Review of patient's allergies indicates no known allergies.  Home Medications   Prior to Admission medications   Medication Sig Start Date End Date Taking? Authorizing Provider  Ascorbic Acid (VITAMIN C) 100 MG tablet Take 100 mg by mouth daily.   Yes Historical Provider, MD  aspirin 81 MG tablet Take 81 mg by mouth daily.   Yes Historical Provider, MD  Omega-3 Fatty Acids (FISH OIL) 1000 MG CAPS Take 1 capsule by mouth daily.   Yes Historical Provider, MD  predniSONE (DELTASONE) 20 MG tablet Take 2 tablets (40 mg total) by mouth daily. Patient  not taking: Reported on 06/07/2014 08/18/13   Robyn Haber, MD   BP 111/50 mmHg  Pulse 81  Temp(Src) 98.1 F (36.7 C) (Oral)  Resp 20  Ht 5\' 4"  (1.626 m)  Wt 148 lb (67.132 kg)  BMI 25.39 kg/m2  SpO2 95%  Vitals reviewed Physical Exam  Physical Examination: General appearance - alert, well appearing, and in no distress Mental status - alert, oriented to person, place, and time Eyes - no conjunctival injection, no scleral icterus Mouth - mucous membranes moist, pharynx normal without lesions Chest - BSS, bilateral wheezing, coarse breath sounds Heart - normal rate, regular rhythm, normal S1, S2, no murmurs, rubs, clicks or gallops Abdomen - soft, nontender, nondistended, no masses or organomegaly Extremities - peripheral pulses normal, no pedal edema, no clubbing or cyanosis Skin - normal coloration and turgor, no rashes  ED Course  Procedures (including critical care time)  10:12 PM d/w Dr. Daryll Drown, triad for admission, she will see patient in the ED.  Labs Review Labs Reviewed  BASIC METABOLIC PANEL - Abnormal; Notable for the following:    Potassium 3.3 (*)    Glucose, Bld 171 (*)    Calcium 8.3 (*)    GFR calc non Af Amer 56 (*)    GFR calc Af Amer 65 (*)    All other components  within normal limits  CBC - Abnormal; Notable for the following:    RBC 3.84 (*)    Hemoglobin 11.6 (*)    HCT 35.5 (*)    All other components within normal limits  BASIC METABOLIC PANEL - Abnormal; Notable for the following:    Glucose, Bld 180 (*)    All other components within normal limits  CBC - Abnormal; Notable for the following:    RBC 3.81 (*)    Hemoglobin 11.7 (*)    HCT 35.3 (*)    All other components within normal limits  CG4 I-STAT (LACTIC ACID) - Abnormal; Notable for the following:    Lactic Acid, Venous 2.46 (*)    All other components within normal limits  CULTURE, BLOOD (ROUTINE X 2)  CULTURE, BLOOD (ROUTINE X 2)  CULTURE, EXPECTORATED SPUTUM-ASSESSMENT  GRAM STAIN   INFLUENZA ANTIGEN  BRAIN NATRIURETIC PEPTIDE  INFLUENZA PANEL BY PCR (TYPE A & B, H1N1)  STREP PNEUMONIAE URINARY ANTIGEN  HIV ANTIBODY (ROUTINE TESTING)  LEGIONELLA ANTIGEN, URINE  I-STAT CG4 LACTIC ACID, ED    Imaging Review Dg Chest 2 View  06/07/2014   CLINICAL DATA:  64 year old female with a history of chest pain and chest tightness. Shortness of breath. Coughing.  EXAM: CHEST - 2 VIEW  COMPARISON:  06/11/2009, 06/06/2009  FINDINGS: Cardiomediastinal silhouette unchanged in size and contour. No evidence of pulmonary vascular congestion.  Atherosclerotic calcifications of the aortic arch.  No visualized pneumothorax or pleural effusion.  New airspace and interstitial disease in the right middle lobe, most prominent at the costophrenic angle on the anterior view, and within the retrosternal region on the lateral view.  Coarse interstitial markings throughout the bilateral lungs.  No displaced fracture.  Unchanged appearance of the visualized spine.  IMPRESSION: Evidence of right middle lobe lobar pneumonia. Recommend repeat plain film once the patient has cleared this acute process to assure resolution.  Atherosclerosis.  Signed,  Dulcy Fanny. Earleen Newport, DO  [facility]  [facility]   Electronically Signed   By: Corrie Mckusick D.O.   On: 06/07/2014 19:05     EKG Interpretation   Date/Time:  Monday June 07 2014 17:26:39 EST Ventricular Rate:  140 PR Interval:  121 QRS Duration: 81 QT Interval:  301 QTC Calculation: 459 R Axis:   85 Text Interpretation:  Sinus tachycardia Ventricular tachycardia,  unsustained Aberrant conduction of SV complex(es) Borderline right axis  deviation Borderline repolarization abnormality since prior tracing rate  has increased Confirmed by Canary Brim  MD, [place] [phone]) on 06/07/2014 5:44:22  PM      MDM   Final diagnoses:  None  community acquired pneumonia Bronchospasm hypokalemia  Pt with RML pneumonia on  CXR with associated wheezing- no hx of same.  Pt treated with albuterol x 2 prior to arrival with minimal improvement.  2 duonebs and solumedrol given in the ED with continued wheezing and shrotness of breath.  Pt was given rocephin IM in the urgent care- zithromax added here for coverage of CAP.  Pt admitted to hospitalist service for further management.     Threasa Beards, MD 06/08/14 [phone]

## 2014-06-07 NOTE — H&P (Signed)
[facility] History and Physical  Shelia Berg MCN:[id number] DOB: 11/05/1950 DOA: 06/07/2014  Referring physician: ED physician PCP: [name] Patient   Chief Complaint: SOB  HPI:  Shelia Berg is a 64yo woman with PMH of bladder cancer 6 years ago who presented to UC today for SOB, cough, fever at home.  CXR done at that time showed RML pneumonia and she was sent to the ED.  Shelia Berg notes that she has been feeling poorly for about 3 days.  She thought she had the flu with muscle aches and low grade fever. She took OTC medications without any relief.  She continued to have dry cough progressing to productive cough of white sputum and sweating.  She was noted to be wheezing significantly in the UC and she was given 2 nebulizer treatments without improvement.  She has a sick contact in her daughter.  She is a smoker, but she stated that she quit 1 week ago.  She has smoked 1/2 ppd for "a while."  She received 2L boluses in the ED and IM rocephin at [facility].  She also received PO azithromycin in the ED.    Assessment and Plan: Community acquired pneumonia - Rocephin/Zithromycin - Urinary antigens for strep pneumonia and legionella sent - Sputum culture, blood cultures are pending - Flu PCR ordered - IVF with NS at 75cc/hr overnight - Duonebs q 6 hours given bronchospasm, also prn - She may have underlying COPD, will need outpatient PFTs - She received a dose of steroids in the ED and her wheezing is much improved on my exam.    Hypokalemia - Repleted with PO potassium - Recheck BMET in the AM.   Sinus tach with aberrant condution - No chest pain or signs of ACS on exam - Repeat EKG in the AM  Diet: Regular  DVT PPx: Lovenox  Radiological Exams on Admission: Dg Chest 2 View  06/07/2014   CLINICAL DATA:  64 year old female with a history of chest pain and chest tightness. Shortness of breath. Coughing.  EXAM: CHEST - 2 VIEW  COMPARISON:  06/11/2009, 06/06/2009  FINDINGS:  Cardiomediastinal silhouette unchanged in size and contour. No evidence of pulmonary vascular congestion.  Atherosclerotic calcifications of the aortic arch.  No visualized pneumothorax or pleural effusion.  New airspace and interstitial disease in the right middle lobe, most prominent at the costophrenic angle on the anterior view, and within the retrosternal region on the lateral view.  Coarse interstitial markings throughout the bilateral lungs.  No displaced fracture.  Unchanged appearance of the visualized spine.  IMPRESSION: Evidence of right middle lobe lobar pneumonia. Recommend repeat plain film once the patient has cleared this acute process to assure resolution.  Atherosclerosis.  Signed,  Dulcy Fanny. Earleen Newport, DO  [facility]  [facility]   Electronically Signed   By: Corrie Mckusick D.O.   On: 06/07/2014 19:05   Code Status: Full Family Communication: Pt at bedside Disposition Plan: Admit for further evaluation, she will need to be set up with a PCP as she does not appear to have one.  She only follows with someone for yearly follow up s/p bladder cancer 6 years ago.     Review of Systems:  Constitutional: + for low grade fever, diaphoresis. Negative for chills and malaise/fatigue.   HENT: Negative for hearing loss, ear pain, nosebleeds, congestion, sore throat, neck pain, tinnitus and ear discharge.   Eyes: Negative for blurred vision, double vision Respiratory: + for dry cough,  white sputum, SOB, wheezing. Negative for hemoptysis and stridor.   Cardiovascular: Negative for chest pain, palpitations, orthopnea, claudication and leg swelling.  Gastrointestinal: Negative for nausea, vomiting and abdominal pain.  Genitourinary: Negative for dysuria, urgency, frequency, hematuria and flank pain.  Musculoskeletal: + for myalgias Negative for back pain, joint pain and falls.  Skin: Negative for itching and rash.  Neurological: Negative for dizziness and  weakness. Negative for tingling, tremors, sensory change, speech change, focal weakness, loss of consciousness and headaches.  Psychiatric/Behavioral: Negative for suicidal ideas. The patient is not nervous/anxious.      Past Medical History  Diagnosis Date  . Bladder cancer 6 years ago    Past Surgical History  Procedure Laterality Date  . Abdominal hysterectomy      Social History:  reports that she has been smoking Cigarettes.  She has been smoking about 0.50 packs per day. She does not have any smokeless tobacco history on file. She reports that she does not drink alcohol or use illicit drugs.  No Known Allergies  No family history on file.  Prior to Admission medications   Medication Sig Start Date End Date Taking? Authorizing Provider  Ascorbic Acid (VITAMIN C) 100 MG tablet Take 100 mg by mouth daily.   Yes Historical Provider, MD  aspirin 81 MG tablet Take 81 mg by mouth daily.   Yes Historical Provider, MD  Omega-3 Fatty Acids (FISH OIL) 1000 MG CAPS Take 1 capsule by mouth daily.   Yes Historical Provider, MD           Physical Exam: Filed Vitals:   06/07/14 2030 06/07/14 2118 06/07/14 2200 06/07/14 2230  BP: 106/56 98/56 111/55 106/63  Pulse: 117 112 113 110  Temp:      TempSrc:      Resp: 24 22 27 26   SpO2: 92% 91% 91% 94%    Physical Exam  Constitutional: Appears well-developed and well-nourished. No distress. She is somewhat diaphoretic on exam.   HENT: Normocephalic. Oropharynx is clear and moist.  Eyes: Conjunctivae and EOM are normal.  no scleral icterus. + crusting discharge from right eye, but eye is without injection.   CVS: tachycardia, NR, S1/S2 +, no murmurs, no gallops Pulmonary: + crackles at bases, rales in RLL and RML. Effort  normal, no stridor Abdominal: Soft. BS +,  no distension, tenderness Musculoskeletal:  No edema and no tenderness.  Neuro: Alert. No acute deficit Skin: Skin is warm and diaphoretic, + SK's on back.. No rash noted. No  erythema. No pallor.  Psychiatric: Normal mood and affect. Behavior, judgment, thought content normal.   Labs on Admission:  Basic Metabolic Panel:  Recent Labs Lab 06/07/14 1742  NA 137  K 3.3*  CL 102  CO2 25  GLUCOSE 171*  BUN 10  CREATININE 1.04  CALCIUM 8.3*   CBC:  Recent Labs Lab 06/07/14 1918  WBC 8.3  HGB 11.6*  HCT 35.5*  MCV 92.4  PLT 184    EKG: Sinus tachycardia with aberrant conduction  Gilles Chiquito, MD [phone]  If 7PM-7AM, please contact night-coverage [email or web address] Password [email or web address] 06/07/2014, 11:13 PM

## 2014-06-08 DIAGNOSIS — J189 Pneumonia, unspecified organism: Secondary | ICD-10-CM | POA: Diagnosis not present

## 2014-06-08 LAB — CBC
HCT: 35.3 % — ABNORMAL LOW (ref 36.0–46.0)
Hemoglobin: 11.7 g/dL — ABNORMAL LOW (ref 12.0–15.0)
MCH: 30.7 pg (ref 26.0–34.0)
MCHC: 33.1 g/dL (ref 30.0–36.0)
MCV: 92.7 fL (ref 78.0–100.0)
Platelets: 200 10*3/uL (ref 150–400)
RBC: 3.81 MIL/uL — ABNORMAL LOW (ref 3.87–5.11)
RDW: 12.4 % (ref 11.5–15.5)
WBC: 8.2 10*3/uL (ref 4.0–10.5)

## 2014-06-08 LAB — BASIC METABOLIC PANEL
Anion gap: 7 (ref 5–15)
BUN: 13 mg/dL (ref 6–23)
CALCIUM: 8.4 mg/dL (ref 8.4–10.5)
CO2: 26 mmol/L (ref 19–32)
CREATININE: 0.61 mg/dL (ref 0.50–1.10)
Chloride: 109 mmol/L (ref 96–112)
GFR calc non Af Amer: >90 mL/min (ref >90)
GLUCOSE: 180 mg/dL — AB (ref 70–99)
POTASSIUM: 3.8 mmol/L (ref 3.5–5.1)
Sodium: 142 mmol/L (ref 135–145)

## 2014-06-08 LAB — CG4 I-STAT (LACTIC ACID): Lactic Acid, Venous: 2.46 mmol/L (ref 0.5–2.0)

## 2014-06-08 LAB — INFLUENZA PANEL BY PCR (TYPE A & B)
H1N1 flu by pcr: NOT DETECTED
Influenza A By PCR: NEGATIVE
Influenza B By PCR: NEGATIVE

## 2014-06-08 LAB — STREP PNEUMONIAE URINARY ANTIGEN: Strep Pneumo Urinary Antigen: NEGATIVE

## 2014-06-08 MED ORDER — ACETAMINOPHEN 325 MG PO TABS
650.0000 mg | ORAL_TABLET | Freq: Four times a day (QID) | ORAL | Status: DC | PRN
  Administered 2014-06-08 – 2014-06-09 (×4): 650 mg via ORAL
  Filled 2014-06-08 (×4): qty 2

## 2014-06-08 MED ORDER — IPRATROPIUM-ALBUTEROL 0.5-2.5 (3) MG/3ML IN SOLN
3.0000 mL | Freq: Three times a day (TID) | RESPIRATORY_TRACT | Status: DC
  Administered 2014-06-08 (×3): 3 mL via RESPIRATORY_TRACT
  Filled 2014-06-08 (×3): qty 3

## 2014-06-08 MED ORDER — GUAIFENESIN-DM 100-10 MG/5ML PO SYRP
5.0000 mL | ORAL_SOLUTION | ORAL | Status: DC | PRN
  Administered 2014-06-08 – 2014-06-10 (×2): 5 mL via ORAL
  Filled 2014-06-08 (×2): qty 10

## 2014-06-08 NOTE — Progress Notes (Signed)
[facility] PROGRESS NOTE  Shelia Berg [id number]:[id number] DOB: 09-10-1950 DOA: 06/07/2014 PCP: [name] Patient  Assessment/Plan: Principal Problem:   Community acquired pneumonia - Continue with supportive therapy - Continue azithromycin and Rocephin  Active Problems:   Hypokalemia  - resolved  Code Status: full Family Communication: None at bedside Disposition Plan: Most likely discharge within the next one to 2 days with continued improvement  Consultants:  None  Procedures:  None  Antibiotics:  As listed above  HPI/Subjective: Patient has no new complaints. Breathing is slightly better she reports  Objective: Filed Vitals:   06/08/14 1313  BP: 111/55  Pulse: 79  Temp: 97.5 F (36.4 C)  Resp: 20    Intake/Output Summary (Last 24 hours) at 06/08/14 1555 Last data filed at 06/08/14 1313  Gross per 24 hour  Intake 2027.5 ml  Output    250 ml  Net 1777.5 ml   Filed Weights   06/08/14 0003  Weight: 67.132 kg (148 lb)    Exam:   General:  Patient in no acute distress, alert and awake  Cardiovascular: Regular rate and rhythm, no murmurs or rubs  Respiratory: Clear to auscultation bilaterally, no wheezes  Abdomen: Soft, nondistended, nontender  Musculoskeletal: No cyanosis or clubbing   Data Reviewed: Basic Metabolic Panel:  Recent Labs Lab 06/07/14 1742 06/08/14 0532  NA 137 142  K 3.3* 3.8  CL 102 109  CO2 25 26  GLUCOSE 171* 180*  BUN 10 13  CREATININE 1.04 0.61  CALCIUM 8.3* 8.4   Liver Function Tests: No results for input(s): AST, ALT, ALKPHOS, BILITOT, PROT, ALBUMIN in the last 168 hours. No results for input(s): LIPASE, AMYLASE in the last 168 hours. No results for input(s): AMMONIA in the last 168 hours. CBC:  Recent Labs Lab 06/07/14 1918 06/08/14 0532  WBC 8.3 8.2  HGB 11.6* 11.7*  HCT 35.5* 35.3*  MCV 92.4 92.7  PLT 184 200   Cardiac Enzymes: No results for input(s): CKTOTAL, CKMB, CKMBINDEX, TROPONINI in  the last 168 hours. BNP (last 3 results) No results for input(s): PROBNP in the last 8760 hours. CBG: No results for input(s): GLUCAP in the last 168 hours.  Recent Results (from the past 240 hour(s))  Blood culture (routine x 2)     Status: None (Preliminary result)   Collection Time: 06/07/14  5:47 PM  Result Value Ref Range Status   Specimen Description BLOOD LEFT ARM  Final   Special Requests BOTTLES DRAWN AEROBIC AND ANAEROBIC 5CC  Final   Culture   Final           BLOOD CULTURE RECEIVED NO GROWTH TO DATE CULTURE WILL BE HELD FOR 5 DAYS BEFORE ISSUING A FINAL NEGATIVE REPORT Performed at [facility]    Report Status PENDING  Incomplete  Blood culture (routine x 2)     Status: None (Preliminary result)   Collection Time: 06/07/14  6:11 PM  Result Value Ref Range Status   Specimen Description BLOOD RIGHT ANTECUBITAL  Final   Special Requests BOTTLES DRAWN AEROBIC AND ANAEROBIC 5CC EACH  Final   Culture   Final           BLOOD CULTURE RECEIVED NO GROWTH TO DATE CULTURE WILL BE HELD FOR 5 DAYS BEFORE ISSUING A FINAL NEGATIVE REPORT Performed at [facility]    Report Status PENDING  Incomplete     Studies: Dg Chest 2 View  06/07/2014   CLINICAL DATA:  64 year old female with a  history of chest pain and chest tightness. Shortness of breath. Coughing.  EXAM: CHEST - 2 VIEW  COMPARISON:  06/11/2009, 06/06/2009  FINDINGS: Cardiomediastinal silhouette unchanged in size and contour. No evidence of pulmonary vascular congestion.  Atherosclerotic calcifications of the aortic arch.  No visualized pneumothorax or pleural effusion.  New airspace and interstitial disease in the right middle lobe, most prominent at the costophrenic angle on the anterior view, and within the retrosternal region on the lateral view.  Coarse interstitial markings throughout the bilateral lungs.  No displaced fracture.  Unchanged appearance of the visualized spine.  IMPRESSION: Evidence of right  middle lobe lobar pneumonia. Recommend repeat plain film once the patient has cleared this acute process to assure resolution.  Atherosclerosis.  Signed,  Dulcy Fanny. Earleen Newport, DO  [facility]  [facility]   Electronically Signed   By: Corrie Mckusick D.O.   On: 06/07/2014 19:05    Scheduled Meds: . aspirin EC  81 mg Oral Daily  . azithromycin  500 mg Oral Q24H  . cefTRIAXone (ROCEPHIN)  IV  1 g Intravenous Q24H  . enoxaparin (LOVENOX) injection  40 mg Subcutaneous QHS  . ipratropium-albuterol  3 mL Nebulization TID   Continuous Infusions: . sodium chloride 75 mL/hr at 06/07/14 2354     Time spent: > 15 minutes    Velvet Bathe  [facility] Pager [phone]. If 7PM-7AM, please contact night-coverage at [email or web address], password [facility] 06/08/2014, 3:55 PM  LOS: 1 day

## 2014-06-08 NOTE — Progress Notes (Signed)
UR completed 

## 2014-06-08 NOTE — Care Management Note (Signed)
    Page 1 of 1   06/08/2014     1:44:09 PM CARE MANAGEMENT NOTE 06/08/2014  Patient:  Shelia Berg, Shelia Berg   Account Number:  [id number]  Date Initiated:  06/08/2014  Documentation initiated by:  Sunday Spillers  Subjective/Objective Assessment:   64 yo female admitted with PNA. PTA lived at home with family.     Action/Plan:   Home when stable   Anticipated DC Date:  06/08/2014   Anticipated DC Plan:  [place]  CM consult  PCP issues      Choice offered to / List presented to:             Status of service:  Completed, signed off Medicare Important Message given?   (If response is "NO", the following Medicare IM given date fields will be blank) Date Medicare IM given:   Medicare IM given by:   Date Additional Medicare IM given:   Additional Medicare IM given by:    Discharge Disposition:  HOME/SELF CARE  Per UR Regulation:  Reviewed for med. necessity/level of care/duration of stay  If discussed at [place] of Stay Meetings, dates discussed:    Comments:  06-08-14 Sunday Spillers RN CM 4 Spoke with patient at bedside regarding PCP needs. Patient states she has gone to Urgent Care at [facility] for over 10 yrs and wishes to continue to use them for primary care. She states she likes no having to wait for an appt. She currently lives and home with family, is anxious to d/c so she can care for her grandson. No further d/c needs identified.

## 2014-06-09 ENCOUNTER — Inpatient Hospital Stay (HOSPITAL_COMMUNITY): Payer: Self-pay

## 2014-06-09 DIAGNOSIS — R911 Solitary pulmonary nodule: Secondary | ICD-10-CM | POA: Diagnosis not present

## 2014-06-09 DIAGNOSIS — J189 Pneumonia, unspecified organism: Secondary | ICD-10-CM | POA: Diagnosis not present

## 2014-06-09 DIAGNOSIS — R0602 Shortness of breath: Secondary | ICD-10-CM | POA: Diagnosis not present

## 2014-06-09 DIAGNOSIS — J441 Chronic obstructive pulmonary disease with (acute) exacerbation: Secondary | ICD-10-CM | POA: Diagnosis not present

## 2014-06-09 DIAGNOSIS — E876 Hypokalemia: Secondary | ICD-10-CM | POA: Diagnosis not present

## 2014-06-09 LAB — HIV ANTIBODY (ROUTINE TESTING W REFLEX): HIV-1/HIV-2 Ab: NONREACTIVE

## 2014-06-09 LAB — LEGIONELLA ANTIGEN, URINE

## 2014-06-09 MED ORDER — METHYLPREDNISOLONE SODIUM SUCC 40 MG IJ SOLR
40.0000 mg | Freq: Two times a day (BID) | INTRAMUSCULAR | Status: DC
  Administered 2014-06-09 – 2014-06-10 (×3): 40 mg via INTRAVENOUS
  Filled 2014-06-09 (×4): qty 1

## 2014-06-09 MED ORDER — IPRATROPIUM-ALBUTEROL 0.5-2.5 (3) MG/3ML IN SOLN
3.0000 mL | RESPIRATORY_TRACT | Status: DC
  Administered 2014-06-09 – 2014-06-10 (×6): 3 mL via RESPIRATORY_TRACT
  Filled 2014-06-09 (×6): qty 3

## 2014-06-09 MED ORDER — OXYCODONE-ACETAMINOPHEN 5-325 MG PO TABS
1.0000 | ORAL_TABLET | ORAL | Status: DC | PRN
  Administered 2014-06-09 – 2014-06-10 (×4): 1 via ORAL
  Filled 2014-06-09 (×4): qty 1

## 2014-06-09 MED ORDER — MORPHINE SULFATE 2 MG/ML IJ SOLN: 1.0000 mg | INTRAMUSCULAR | Status: DC | PRN

## 2014-06-09 NOTE — Progress Notes (Signed)
Patient ID: Shelia Berg, female   DOB: 08/04/1950, 64 y.o.   MRN: [id number] [facility] PROGRESS NOTE  Shelia Berg GXQ:[id number] DOB: 1951/04/20 DOA: 06/07/2014 PCP: [name] Patient  Brief narrative:    64 year old female with past medical history of bladder cancer about 6 years ago, smoker who presented to urgent care for shortness of breath, cough, fevers and chills at home. Chest x-ray showed right middle lobe pneumonia and patient was sent to ED for further evaluation. Patient was given nebulizer treatment but continued to have wheezing.patient was started on azithromycin and Rocephin on admission for treatment of pneumonia.   Assessment/Plan:    Principal Problem:   Community acquired pneumonia - patient with shortness of breath, cough, fevers and evidence of pneumonia on chest x-ray. Chest x-ray on admission showed right middle lobe pneumonia. - patient was started on azithromycin and Rocephin on admission - Blood cultures to date are negative - Continue nebulizer treatments with duoneb every 4 hours scheduled and every 6 hours as needed for shortness of breath or wheezing - patient has some chest pain with coughing. Analgesia with oxycodone every 4 hours as needed for moderate pain and morphine 1 mg IV every 2 hours as needed for severe pain. Obtain CT scan for further evaluation because patient complained of pain with breathing.  Active Problems: Acute COPD exacerbation - Adjusted nebulizer treatments so now she is on DuoNeb every 4 hours scheduled and every 6 hours as needed for shortness of breath or wheezing - start Solu-Medrol 40 mg IV every 12 hours - Oxygen support via nasal cannula to keep oxygen saturation above 90% - Patient is on antibiotics for treatment of pneumonia - COPD called alert order set in place  Hypokalemia - Unclear etiology but possibly from nebulizer treatments - Supplemented   DVT Prophylaxis  - Lovenox subcutaneous ordered   Code  Status: Full.  Family Communication:  plan of care discussed with the patient Disposition Plan: Home when stable.   IV access:  Peripheral IV  Procedures and diagnostic studies:    Dg Chest 2 View 06/07/2014   Evidence of right middle lobe lobar pneumonia. Recommend repeat plain film once the patient has cleared this acute process to assure resolution.  Atherosclerosis.   Medical Consultants:  None  Other Consultants:  None  IAnti-Infectives:   Azithromycin 06/08/2014 --> Rocephin 06/08/2014 -->   Leisa Lenz, MD  [facility] Pager [phone]  If 7PM-7AM, please contact night-coverage [email or web address] Password [facility] 06/09/2014, 10:59 AM   LOS: 2 days    HPI/Subjective: No acute overnight events.  Objective: Filed Vitals:   06/08/14 1536 06/08/14 2127 06/08/14 2218 06/09/14 0614  BP:   111/50 125/60  Pulse:   81 77  Temp:   98.1 F (36.7 C) 97.7 F (36.5 C)  TempSrc:   Oral Oral  Resp:   20 20  Height:      Weight:      SpO2: 96% 94% 95% 97%    Intake/Output Summary (Last 24 hours) at 06/09/14 1059 Last data filed at 06/09/14 6314  Gross per 24 hour  Intake 3496.55 ml  Output   1050 ml  Net 2446.55 ml    Exam:   General:  Pt is alert, follows commands appropriately, not in acute distress  Cardiovascular: Regular rate and rhythm, S1/S2 (+)  Respiratory: diminished breath sounds bilaterally   Abdomen: Soft, non tender, non distended, bowel sounds present  Extremities: No edema, pulses DP and PT palpable bilaterally  Neuro:  Grossly nonfocal  Data Reviewed: Basic Metabolic Panel:  Recent Labs Lab 06/07/14 1742 06/08/14 0532  NA 137 142  K 3.3* 3.8  CL 102 109  CO2 25 26  GLUCOSE 171* 180*  BUN 10 13  CREATININE 1.04 0.61  CALCIUM 8.3* 8.4   Liver Function Tests: No results for input(s): AST, ALT, ALKPHOS, BILITOT, PROT, ALBUMIN in the last 168 hours. No results for input(s): LIPASE, AMYLASE in the last 168 hours. No results for  input(s): AMMONIA in the last 168 hours. CBC:  Recent Labs Lab 06/07/14 1918 06/08/14 0532  WBC 8.3 8.2  HGB 11.6* 11.7*  HCT 35.5* 35.3*  MCV 92.4 92.7  PLT 184 200   Cardiac Enzymes: No results for input(s): CKTOTAL, CKMB, CKMBINDEX, TROPONINI in the last 168 hours. BNP: Invalid input(s): POCBNP CBG: No results for input(s): GLUCAP in the last 168 hours.  Recent Results (from the past 240 hour(s))  Blood culture (routine x 2)     Status: None (Preliminary result)   Collection Time: 06/07/14  5:47 PM  Result Value Ref Range Status   Specimen Description BLOOD LEFT ARM  Final   Special Requests BOTTLES DRAWN AEROBIC AND ANAEROBIC 5CC  Final   Culture   Final           BLOOD CULTURE RECEIVED NO GROWTH TO DATE CULTURE WILL BE HELD FOR 5 DAYS BEFORE ISSUING A FINAL NEGATIVE REPORT Performed at [facility]    Report Status PENDING  Incomplete  Blood culture (routine x 2)     Status: None (Preliminary result)   Collection Time: 06/07/14  6:11 PM  Result Value Ref Range Status   Specimen Description BLOOD RIGHT ANTECUBITAL  Final   Special Requests BOTTLES DRAWN AEROBIC AND ANAEROBIC 5CC EACH  Final   Culture   Final           BLOOD CULTURE RECEIVED NO GROWTH TO DATE CULTURE WILL BE HELD FOR 5 DAYS BEFORE ISSUING A FINAL NEGATIVE REPORT Performed at [facility]    Report Status PENDING  Incomplete     Scheduled Meds: . aspirin EC  81 mg Oral Daily  . azithromycin  500 mg Oral Q24H  . cefTRIAXone (ROCEPHIN)  IV  1 g Intravenous Q24H  . enoxaparin (LOVENOX) injection  40 mg Subcutaneous QHS  . ipratropium-albuterol  3 mL Nebulization TID   Continuous Infusions: . sodium chloride 75 mL/hr at 06/09/14 [phone]

## 2014-06-10 DIAGNOSIS — J189 Pneumonia, unspecified organism: Secondary | ICD-10-CM | POA: Diagnosis not present

## 2014-06-10 DIAGNOSIS — R911 Solitary pulmonary nodule: Secondary | ICD-10-CM

## 2014-06-10 DIAGNOSIS — E876 Hypokalemia: Secondary | ICD-10-CM | POA: Diagnosis not present

## 2014-06-10 DIAGNOSIS — J441 Chronic obstructive pulmonary disease with (acute) exacerbation: Secondary | ICD-10-CM | POA: Diagnosis not present

## 2014-06-10 MED ORDER — OXYCODONE-ACETAMINOPHEN 5-325 MG PO TABS: 1.0000 | ORAL_TABLET | ORAL | Status: DC | PRN

## 2014-06-10 MED ORDER — GUAIFENESIN-DM 100-10 MG/5ML PO SYRP: 5.0000 mL | ORAL_SOLUTION | ORAL | Status: DC | PRN

## 2014-06-10 MED ORDER — LEVOFLOXACIN 750 MG PO TABS: 750.0000 mg | ORAL_TABLET | Freq: Every day | ORAL | Status: DC

## 2014-06-10 NOTE — Progress Notes (Signed)
Pt d/c home prior to being seen by CSW. RNCM met with pt prior to d/c to offer assistance with d/c planning.  Werner Lean LCSW [phone]

## 2014-06-10 NOTE — Discharge Instructions (Signed)

## 2014-06-10 NOTE — Discharge Summary (Signed)
Physician Discharge Summary  Shelia Berg XVQ:[id number] DOB: 01/30/1951 DOA: 06/07/2014  PCP: [name] Patient  Admit date: 06/07/2014 Discharge date: 06/10/2014  Recommendations for Outpatient Follow-up:  1. CAT scan of the lungs showed 6 mm right upper lobe pulmonary nodule. It is an imperative that you follow with primary care physician and have repeat CT chest in 6-12 months from this discharge. 2. Continue Levaquin for 10 days on discharge for treatment of pneumonia.  Discharge Diagnoses:  Principal Problem:   Community acquired pneumonia Active Problems:   Hypokalemia   CAP (community acquired pneumonia)    Discharge Condition: stable   Diet recommendation: as tolerated   History of present illness:  64 year old female with past medical history of bladder cancer about 6 years ago, smoker who presented to urgent care for shortness of breath, cough, fevers and chills at home. Chest x-ray showed right middle lobe pneumonia and patient was sent to ED for further evaluation. Patient was given nebulizer treatment but continued to have wheezing.patient was started on azithromycin and Rocephin on admission for treatment of pneumonia. CT chest was obtained and findings concerning for a 6 mm right upper lobe pulmonary nodule which requires repeat CT scan in 6-12 months. Patient is aware of this and reports understanding.   Assessment/Plan:    Principal Problem:  Community acquired pneumonia - Patient was started on azithromycin and Rocephin for treatment of pneumonia. Chest x-ray on the admission showed right middle lobe pneumonia. - She had some complaints of chest tightness with cough on 06/09/2014. We'll obtain CT chest which showed 6 mm right upper lobe pulmonary nodule. I spoke extensively with the patient about this and need to repeat CT scan in 6-12 months. She verbalized the understanding. - Blood cultures to date are negative. Patient feels better this morning. - Patient  stable for discharge. She will continue Levaquin for 10 days on discharge.   Active Problems: Acute COPD exacerbation - Patient received nebulizer treatment throughout the hospital stay. She feels better. She feels that she does not need nebulizer treatments at the time of discharge. - She will continue Levaquin for pneumonia for 10 days on discharge.  Hypokalemia - Unclear etiology but possibly from nebulizer treatments - Supplemented   DVT Prophylaxis  - Lovenox subcutaneous ordered while patient is in hospital   Code Status: Full.  Family Communication: plan of care discussed with the patient   IV access:  Peripheral IV  Procedures and diagnostic studies:   Dg Chest 2 View 06/07/2014 Evidence of right middle lobe lobar pneumonia. Recommend repeat plain film once the patient has cleared this acute process to assure resolution. Atherosclerosis.   Medical Consultants:  None  Other Consultants:  None  IAnti-Infectives:   Azithromycin 06/08/2014 --> Rocephin 06/08/2014 -->    Signed:  Leisa Lenz, MD  [facility] 06/10/2014, 10:21 AM  Pager #: [phone]    Discharge Exam: Filed Vitals:   06/10/14 0522  BP: 131/80  Pulse: 82  Temp: 97.4 F (36.3 C)  Resp: 18   Filed Vitals:   06/09/14 2344 06/10/14 0320 06/10/14 0522 06/10/14 0725  BP:   131/80   Pulse:   82   Temp:   97.4 F (36.3 C)   TempSrc:   Oral   Resp:   18   Height:      Weight:      SpO2: 98% 98% 94% 95%    General: Pt is alert, follows commands appropriately, not in acute distress Cardiovascular: Regular rate  and rhythm, S1/S2 +, no murmurs Respiratory: Clear to auscultation bilaterally, no wheezing, no crackles, no rhonchi Abdominal: Soft, non tender, non distended, bowel sounds +, no guarding Extremities: no edema, no cyanosis, pulses palpable bilaterally DP and PT Neuro: Grossly nonfocal  Discharge Instructions  Discharge Instructions    Call MD  for:  difficulty breathing, headache or visual disturbances    Complete by:  As directed      Call MD for:  persistant nausea and vomiting    Complete by:  As directed      Call MD for:  severe uncontrolled pain    Complete by:  As directed      Diet - low sodium heart healthy    Complete by:  As directed      Discharge instructions    Complete by:  As directed   1. CAT scan of the lungs showed 6 mm right upper lobe pulmonary nodule. It is an imperative that you follow with primary care physician and have repeat CT chest in 6-12 months from this discharge. 2. Continue Levaquin for 10 days on discharge for treatment of pneumonia.     Increase activity slowly    Complete by:  As directed             Medication List    STOP taking these medications        predniSONE 20 MG tablet  Commonly known as:  DELTASONE      TAKE these medications        aspirin 81 MG tablet  Take 81 mg by mouth daily.     Fish Oil 1000 MG Caps  Take 1 capsule by mouth daily.     guaiFENesin-dextromethorphan 100-10 MG/5ML syrup  Commonly known as:  ROBITUSSIN DM  Take 5 mLs by mouth every 4 (four) hours as needed for cough.     levofloxacin 750 MG tablet  Commonly known as:  LEVAQUIN  Take 1 tablet (750 mg total) by mouth daily.     oxyCODONE-acetaminophen 5-325 MG per tablet  Commonly known as:  PERCOCET/ROXICET  Take 1 tablet by mouth every 4 (four) hours as needed for moderate pain.     vitamin C 100 MG tablet  Take 100 mg by mouth daily.          The results of significant diagnostics from this hospitalization (including imaging, microbiology, ancillary and laboratory) are listed below for reference.    Significant Diagnostic Studies: Dg Chest 2 View  06/07/2014   CLINICAL DATA:  64 year old female with a history of chest pain and chest tightness. Shortness of breath. Coughing.  EXAM: CHEST - 2 VIEW  COMPARISON:  06/11/2009, 06/06/2009  FINDINGS: Cardiomediastinal silhouette unchanged  in size and contour. No evidence of pulmonary vascular congestion.  Atherosclerotic calcifications of the aortic arch.  No visualized pneumothorax or pleural effusion.  New airspace and interstitial disease in the right middle lobe, most prominent at the costophrenic angle on the anterior view, and within the retrosternal region on the lateral view.  Coarse interstitial markings throughout the bilateral lungs.  No displaced fracture.  Unchanged appearance of the visualized spine.  IMPRESSION: Evidence of right middle lobe lobar pneumonia. Recommend repeat plain film once the patient has cleared this acute process to assure resolution.  Atherosclerosis.  Signed,  Dulcy Fanny. Earleen Newport, DO  [facility]  [facility]   Electronically Signed   By: Corrie Mckusick D.O.   On: 06/07/2014 19:05   Ct  Chest Wo Contrast  06/09/2014   CLINICAL DATA:  Right-sided chest pain.  Pneumonia 5 days.  EXAM: CT CHEST WITHOUT CONTRAST  TECHNIQUE: Multidetector CT imaging of the chest was performed following the standard protocol without IV contrast.  COMPARISON:  Radiograph 06/07/2014, 06/11/2009  FINDINGS: Mediastinum/Nodes: No axillary or supraclavicular lymphadenopathy. No mediastinal hilar lymphadenopathy. No pericardial fluid. The esophagus wall is diffusely thickened.  Lungs/Pleura: There is a bibasilar pleural thickening and linear interstitial markings at the inferior right middle lobe and right lower lobe. This may represent resolving pneumonia seen on comparison radiograph. No airspace disease suggests active pneumonia. T  Here is mild branching airspace disease in the mid right upper lobe on image \\25 , series 5 which could represent mild acute infection. In the anterior right upper lobe there is a noncalcified pulmonary nodule measuring 6 mm (image 25, series 5). Mild scarring in the inferior lingula. Left lower lobe is relatively clear.  Upper abdomen: Limited view of the liver,  kidneys, pancreas are unremarkable. Normal adrenal glands.  Musculoskeletal: No aggressive osseous lesion.  IMPRESSION: 1. Pleural parenchymal thickening and linear nodular interstitial markings at the right lung base may represent resolving pneumonia. No organized pneumonia currently. 2. Mild airspace disease in the mid right upper lobe could represent a focus of mild acute infection. 3. A 6 mm right upper lobe pulmonary nodule. If the patient is at high risk for bronchogenic carcinoma, follow-up chest CT at 6-12 months is recommended. If the patient is at low risk for bronchogenic carcinoma, follow-up chest CT at 12 months is recommended. This recommendation follows the consensus statement: Guidelines for Management of Small Pulmonary Nodules Detected on CT Scans: A Statement from the [place] as published in Radiology 2005;237:395-400. Appear   Electronically Signed   By: Suzy Bouchard M.D.   On: 06/09/2014 13:07    Microbiology: Recent Results (from the past 240 hour(s))  Blood culture (routine x 2)     Status: None (Preliminary result)   Collection Time: 06/07/14  5:47 PM  Result Value Ref Range Status   Specimen Description BLOOD LEFT ARM  Final   Special Requests BOTTLES DRAWN AEROBIC AND ANAEROBIC 5CC  Final   Culture   Final           BLOOD CULTURE RECEIVED NO GROWTH TO DATE CULTURE WILL BE HELD FOR 5 DAYS BEFORE ISSUING A FINAL NEGATIVE REPORT Performed at [facility]    Report Status PENDING  Incomplete  Blood culture (routine x 2)     Status: None (Preliminary result)   Collection Time: 06/07/14  6:11 PM  Result Value Ref Range Status   Specimen Description BLOOD RIGHT ANTECUBITAL  Final   Special Requests BOTTLES DRAWN AEROBIC AND ANAEROBIC 5CC EACH  Final   Culture   Final           BLOOD CULTURE RECEIVED NO GROWTH TO DATE CULTURE WILL BE HELD FOR 5 DAYS BEFORE ISSUING A FINAL NEGATIVE REPORT Performed at [facility]    Report Status PENDING   Incomplete     Labs: Basic Metabolic Panel:  Recent Labs Lab 06/07/14 1742 06/08/14 0532  NA 137 142  K 3.3* 3.8  CL 102 109  CO2 25 26  GLUCOSE 171* 180*  BUN 10 13  CREATININE 1.04 0.61  CALCIUM 8.3* 8.4   Liver Function Tests: No results for input(s): AST, ALT, ALKPHOS, BILITOT, PROT, ALBUMIN in the last 168 hours. No results for input(s): LIPASE, AMYLASE in the last  168 hours. No results for input(s): AMMONIA in the last 168 hours. CBC:  Recent Labs Lab 06/07/14 1918 06/08/14 0532  WBC 8.3 8.2  HGB 11.6* 11.7*  HCT 35.5* 35.3*  MCV 92.4 92.7  PLT 184 200   Cardiac Enzymes: No results for input(s): CKTOTAL, CKMB, CKMBINDEX, TROPONINI in the last 168 hours. BNP: BNP (last 3 results) No results for input(s): PROBNP in the last 8760 hours. CBG: No results for input(s): GLUCAP in the last 168 hours.  Time coordinating discharge: Over 30 minutes

## 2014-06-10 NOTE — Progress Notes (Signed)
Pt alert and oriented. Pt very pleasant. Pt very eager to get discharged today. Pt informed on process of discharge if this were to happen today as I do not have any orders for discharge. Pt understanding. Pt ambulated to bathroom with no difficulty, pain, or shortness of breath. Pt noted to have expiratory wheezes in the right upper, and left/right lower lobes.

## 2014-06-13 LAB — CULTURE, BLOOD (ROUTINE X 2)
CULTURE: NO GROWTH
Culture: NO GROWTH

## 2014-06-18 ENCOUNTER — Ambulatory Visit (INDEPENDENT_AMBULATORY_CARE_PROVIDER_SITE_OTHER): Payer: 59 | Admitting: Family Medicine

## 2014-06-18 ENCOUNTER — Ambulatory Visit (INDEPENDENT_AMBULATORY_CARE_PROVIDER_SITE_OTHER): Payer: 59

## 2014-06-18 VITALS — BP 104/62 | HR 87 | Temp 98.1°F | Resp 18 | Ht 63.0 in | Wt 138.0 lb

## 2014-06-18 DIAGNOSIS — J189 Pneumonia, unspecified organism: Secondary | ICD-10-CM

## 2014-06-18 DIAGNOSIS — R5383 Other fatigue: Secondary | ICD-10-CM

## 2014-06-18 DIAGNOSIS — R911 Solitary pulmonary nodule: Secondary | ICD-10-CM

## 2014-06-18 LAB — COMPREHENSIVE METABOLIC PANEL
ALT: 11 U/L (ref 0–35)
AST: 15 U/L (ref 0–37)
Albumin: 3.4 g/dL — ABNORMAL LOW (ref 3.5–5.2)
Alkaline Phosphatase: 73 U/L (ref 39–117)
BUN: 16 mg/dL (ref 6–23)
CO2: 27 mEq/L (ref 19–32)
Calcium: 9 mg/dL (ref 8.4–10.5)
Chloride: 105 mEq/L (ref 96–112)
Creat: 0.75 mg/dL (ref 0.50–1.10)
Glucose, Bld: 82 mg/dL (ref 70–99)
Potassium: 4.7 mEq/L (ref 3.5–5.3)
Sodium: 138 mEq/L (ref 135–145)
Total Bilirubin: 0.3 mg/dL (ref 0.2–1.2)
Total Protein: 6.8 g/dL (ref 6.0–8.3)

## 2014-06-18 LAB — POCT CBC
Granulocyte percent: 71.7 %G (ref 37–80)
HCT, POC: 43.5 % (ref 37.7–47.9)
Hemoglobin: 14.2 g/dL (ref 12.2–16.2)
Lymph, poc: 2.1 (ref 0.6–3.4)
MCH, POC: 30.3 pg (ref 27–31.2)
MCHC: 32.7 g/dL (ref 31.8–35.4)
MCV: 92.6 fL (ref 80–97)
MID (cbc): 0.5 (ref 0–0.9)
MPV: 7.6 fL (ref 0–99.8)
POC Granulocyte: 6.5 (ref 2–6.9)
POC LYMPH PERCENT: 23.3 %L (ref 10–50)
POC MID %: 5 %M (ref 0–12)
Platelet Count, POC: 332 10*3/uL (ref 142–424)
RBC: 4.69 M/uL (ref 4.04–5.48)
RDW, POC: 12.9 %
WBC: 9.1 10*3/uL (ref 4.6–10.2)

## 2014-06-18 MED ORDER — PREDNISONE 20 MG PO TABS: 40.0000 mg | ORAL_TABLET | Freq: Every day | ORAL | Status: DC

## 2014-06-18 NOTE — Progress Notes (Signed)
° °  Subjective:    Patient ID: Shelia Berg, female    DOB: 11/28/1950, 64 y.o.   MRN: [id number] This chart was scribed for Robyn Haber, MD by Zola Button, Medical Scribe. This patient was seen in Room 3 and the patient's care was started at 11:38 AM.   HPI  HPI Comments: Shelia Berg is a 64 y.o. female who presents to the [facility] for a hospitalization follow-up. She was admitted to the hospital 06/07/14 - 06/11/14 for pneumonia. Patient states she has been feeling fatigued ("no energy"). She states she had to sit down 3 times when washing her hair this morning. She denies cough and fever at this time. Patient also denies smoking, she recently quit smoking a few weeks ago.  Patient is from [place]. She works at [employer] in [place]  Constitutional: Positive for fatigue. Negative for fever.  Respiratory: Negative for cough.        Objective:   Physical Exam CONSTITUTIONAL: Well developed/well nourished HEAD: Normocephalic/atraumatic EYES: EOM/PERRL ENMT: Mucous membranes moist NECK: supple no meningeal signs SPINE: entire spine nontender CV: S1/S2 noted, no murmurs/rubs/gallops noted LUNGS: Rales predominately in the right side. ABDOMEN: soft, nontender, no rebound or guarding GU: no cva tenderness NEURO: Pt is awake/alert, moves all extremitiesx4 EXTREMITIES: pulses normal, full ROM SKIN: warm, color normal PSYCH: no abnormalities of mood noted  Results for orders placed or performed in visit on 06/18/14  POCT CBC  Result Value Ref Range   WBC 9.1 4.6 - 10.2 K/uL   Lymph, poc 2.1 0.6 - 3.4   POC LYMPH PERCENT 23.3 10 - 50 %L   MID (cbc) 0.5 0 - 0.9   POC MID % 5.0 0 - 12 %M   POC Granulocyte 6.5 2 - 6.9   Granulocyte percent 71.7 37 - 80 %G   RBC 4.69 4.04 - 5.48 M/uL   Hemoglobin 14.2 12.2 - 16.2 g/dL   HCT, POC 43.5 37.7 - 47.9 %   MCV 92.6 80 - 97 fL   MCH, POC 30.3 27 - 31.2 pg   MCHC 32.7 31.8 - 35.4 g/dL   RDW,  POC 12.9 %   Platelet Count, POC 332 142 - 424 K/uL   MPV 7.6 0 - 99.8 fL   [facility] reading (PRIMARY) by  Dr. Joseph Art:  CXR shows right upper lobe density in the hilar area. She also has right costophrenic blunting with some chronic scarring    Assessment & Plan:   This chart was scribed in my presence and reviewed by me personally.    ICD-9-CM ICD-10-CM   1. CAP (community acquired pneumonia) 62 J18.9 POCT CBC     Comprehensive metabolic panel     TSH     DG Chest 2 View     predniSONE (DELTASONE) 20 MG tablet  2. Solitary pulmonary nodule 793.11 R91.1 predniSONE (DELTASONE) 20 MG tablet     Ambulatory referral to Pulmonology  3. Other fatigue 780.79 R53.83 predniSONE (DELTASONE) 20 MG tablet     Signed, Robyn Haber, MD

## 2014-06-19 LAB — TSH: TSH: 0.443 u[IU]/mL (ref 0.350–4.500)

## 2014-11-14 ENCOUNTER — Telehealth: Payer: Self-pay | Admitting: Radiology

## 2014-11-14 ENCOUNTER — Ambulatory Visit (INDEPENDENT_AMBULATORY_CARE_PROVIDER_SITE_OTHER): Payer: 59

## 2014-11-14 ENCOUNTER — Ambulatory Visit (INDEPENDENT_AMBULATORY_CARE_PROVIDER_SITE_OTHER): Payer: 59 | Admitting: Internal Medicine

## 2014-11-14 VITALS — BP 130/60 | HR 88 | Temp 98.5°F | Resp 18 | Ht 62.75 in | Wt 156.1 lb

## 2014-11-14 DIAGNOSIS — M25561 Pain in right knee: Secondary | ICD-10-CM

## 2014-11-14 LAB — COMPREHENSIVE METABOLIC PANEL
ALK PHOS: 85 U/L (ref 39–117)
ALT: 16 U/L (ref 0–35)
AST: 21 U/L (ref 0–37)
Albumin: 3.7 g/dL (ref 3.5–5.2)
BUN: 28 mg/dL — ABNORMAL HIGH (ref 6–23)
CO2: 26 meq/L (ref 19–32)
CREATININE: 0.94 mg/dL (ref 0.50–1.10)
Calcium: 8.6 mg/dL (ref 8.4–10.5)
Chloride: 105 mEq/L (ref 96–112)
Glucose, Bld: 76 mg/dL (ref 70–99)
Potassium: 4.5 mEq/L (ref 3.5–5.3)
Sodium: 140 mEq/L (ref 135–145)
Total Bilirubin: 0.3 mg/dL (ref 0.2–1.2)
Total Protein: 6.7 g/dL (ref 6.0–8.3)

## 2014-11-14 LAB — POCT CBC
Granulocyte percent: 57.7 %G (ref 37–80)
HCT, POC: 41.1 % (ref 37.7–47.9)
Hemoglobin: 13.6 g/dL (ref 12.2–16.2)
Lymph, poc: 1.9 (ref 0.6–3.4)
MCH, POC: 29 pg (ref 27–31.2)
MCHC: 33 g/dL (ref 31.8–35.4)
MCV: 87.8 fL (ref 80–97)
MID (cbc): 0.4 (ref 0–0.9)
MPV: 7.8 fL (ref 0–99.8)
PLATELET COUNT, POC: 241 10*3/uL (ref 142–424)
POC Granulocyte: 3.1 (ref 2–6.9)
POC LYMPH PERCENT: 35.1 %L (ref 10–50)
POC MID %: 7.2 %M (ref 0–12)
RBC: 4.68 M/uL (ref 4.04–5.48)
RDW, POC: 12.9 %
WBC: 5.4 10*3/uL (ref 4.6–10.2)

## 2014-11-14 LAB — URIC ACID: URIC ACID, SERUM: 5.1 mg/dL (ref 2.4–7.0)

## 2014-11-14 LAB — POCT SEDIMENTATION RATE: POCT SED RATE: 45 mm/h — AB (ref 0–22)

## 2014-11-14 MED ORDER — TRAMADOL HCL 50 MG PO TABS: 50.0000 mg | ORAL_TABLET | Freq: Four times a day (QID) | ORAL | Status: DC | PRN

## 2014-11-14 MED ORDER — MELOXICAM 15 MG PO TABS: 15.0000 mg | ORAL_TABLET | Freq: Every day | ORAL | Status: DC

## 2014-11-14 NOTE — Patient Instructions (Signed)
Once your able to rest her knee would be helpful to use ice for 20 minutes 2 or 3 times a day and also to apply heat at bedtime for 30 minutes.

## 2014-11-14 NOTE — Telephone Encounter (Signed)
Called patient to advise her Tramadol was called in to [facility], unfortunately her number is disconnected.

## 2014-11-14 NOTE — Progress Notes (Addendum)
Subjective:  This chart was scribed for Tami Lin, MD by Dr. [facility], medical scribe at [facility].The patient was seen in exam room 13 and the patient's care was started at 9:02 AM.   Patient ID: Theodosia Quay, female    DOB: 04/12/1951, 64 y.o.   MRN: [id number] Chief Complaint  Patient presents with  . Knee Pain    C/O right knee pain. Taking Ibuprofen, only helps for about an hour    HPI HPI Comments: Janiesha Diehl is a 64 y.o. female who presents to the Emergency Department complaining of right knee pain with associated swelling, onset week ago. She took ibuprofen for relief. No known injury or unusual activity. She is on her feet and works frequently. The knee does bother her at night. No fever or chills. No recent history of arthritis. She does take a fish oil daily.  Patient Active Problem List   Diagnosis Date Noted  . Community acquired pneumonia 06/07/2014  . Hypokalemia 06/07/2014  . CAP (community acquired pneumonia) 06/07/2014  . DEGENERATIVE JOINT DISEASE 10/03/2006  . FRACTURE, WRIST, LEFT 05/20/2001  . HX, PERSONAL, VENOUS THROMBOSIS/EMBOLISM 05/15/1987  . TOTAL ABDOMINAL HYSTERECTOMY, HX OF 05/14/1986   Past Medical History  Diagnosis Date  . Bladder cancer 6 years ago   Past Surgical History  Procedure Laterality Date  . Abdominal hysterectomy     No Known Allergies Prior to Admission medications   Medication Sig Start Date End Date Taking? Authorizing Provider  Ascorbic Acid (VITAMIN C) 100 MG tablet Take 100 mg by mouth daily.   Yes Historical Provider, MD  aspirin 81 MG tablet Take 81 mg by mouth daily.   Yes Historical Provider, MD  ibuprofen (ADVIL,MOTRIN) 200 MG tablet Take 200 mg by mouth every 6 (six) hours as needed.   Yes Historical Provider, MD  Omega-3 Fatty Acids (FISH OIL) 1000 MG CAPS Take 1 capsule by mouth daily.   Yes Historical Provider, MD   Review of Systems  Constitutional: Negative for fever and chills.    Musculoskeletal: Positive for joint swelling, arthralgias and gait problem.      Objective:  BP 130/60 mmHg  Pulse 88  Temp(Src) 98.5 F (36.9 C) (Oral)  Resp 18  Ht 5' 2.75" (1.594 m)  Wt 156 lb 2 oz (70.818 kg)  BMI 27.87 kg/m2  SpO2 98% Physical Exam  Constitutional: She is oriented to person, place, and time. She appears well-developed and well-nourished. No distress.  HENT:  Head: Normocephalic and atraumatic.  Eyes: Pupils are equal, round, and reactive to light.  Neck: Normal range of motion.  Cardiovascular: Normal rate and regular rhythm.   Pulmonary/Chest: Effort normal. No respiratory distress.  Musculoskeletal: Normal range of motion.  Right knee is swollen but without effusion. Warm but no redness pain and extension and flexion felt medially. TTP along the medial joint line and pain with stressing the joint line but no laxity.   Neurological: She is alert and oriented to person, place, and time.  Skin: Skin is warm and dry.  Psychiatric: She has a normal mood and affect. Her behavior is normal.  Nursing note and vitals reviewed. BP 130/60 mmHg  Pulse 88  Temp(Src) 98.5 F (36.9 C) (Oral)  Resp 18  Ht 5' 2.75" (1.594 m)  Wt 156 lb 2 oz (70.818 kg)  BMI 27.87 kg/m2  SpO2 98% [facility] reading (PRIMARY) by  Dr. Laney Pastor no acute bony changes. Mild degenerative changes without narrowing of any compartment.  Assessment & Plan:  I have completed the patient encounter in its entirety as documented by the scribe, with editing by me where necessary. Emmitt Matthews P. Laney Pastor, M.D.  Pain in knee joint, right - Plan:  POCT SEDIMENTATION RATE, Comprehensive metabolic panel, Uric Acid,  Inflammatory etiology? Degenerative joint  Ice/heat/rest Meds ordered this encounter  Medications  . ibuprofen (ADVIL,MOTRIN) 200 MG tablet    Sig: Take 200 mg by mouth every 6 (six) hours as needed.  . meloxicam (MOBIC) 15 MG tablet    Sig: Take 1 tablet (15 mg total) by mouth daily.     Dispense:  30 tablet    Refill:  0  . traMADol (ULTRAM) 50 MG tablet    Sig: Take 1-2 tablets (50-100 mg total) by mouth every 6 (six) hours as needed.    Dispense:  40 tablet    Refill:  1     Addendum 12/05/14 Medication is not working for her and she would like an evaluation by [facility] orth[facility] Note sedimentation rate was 45 and uric acid was not done for some reason Radiologist saw patellofemoral arthritis

## 2014-11-15 ENCOUNTER — Encounter: Payer: Self-pay | Admitting: Internal Medicine

## 2014-11-20 ENCOUNTER — Telehealth: Payer: Self-pay | Admitting: *Deleted

## 2014-11-20 NOTE — Telephone Encounter (Signed)
Pt called stating that she did not get her Tramadol medication on 11/14/2014.  Pt was advised that someone tried to call her and let her know that her number was disconnected.  Pt states it was not.  However, she was told that she could go to [facility] on [address] pick up her medication.  Pt understood.

## 2014-12-05 ENCOUNTER — Telehealth: Payer: Self-pay

## 2014-12-05 NOTE — Telephone Encounter (Signed)
Pt stated she was seen on 11/14/14 by Dr.Doolittle. She states that her medication is not working and that she would like a Retail buyer to [employer].

## 2014-12-06 NOTE — Addendum Note (Signed)
Addended by: Leandrew Koyanagi on: 12/06/2014 12:25 PM   Modules accepted: Orders

## 2015-03-13 ENCOUNTER — Ambulatory Visit (INDEPENDENT_AMBULATORY_CARE_PROVIDER_SITE_OTHER): Payer: 59 | Admitting: Family Medicine

## 2015-03-13 ENCOUNTER — Ambulatory Visit (INDEPENDENT_AMBULATORY_CARE_PROVIDER_SITE_OTHER): Payer: 59

## 2015-03-13 VITALS — BP 124/60 | HR 62 | Temp 98.2°F | Resp 16 | Ht 62.0 in | Wt 157.5 lb

## 2015-03-13 DIAGNOSIS — R05 Cough: Secondary | ICD-10-CM

## 2015-03-13 DIAGNOSIS — M25561 Pain in right knee: Secondary | ICD-10-CM | POA: Diagnosis not present

## 2015-03-13 DIAGNOSIS — R059 Cough, unspecified: Secondary | ICD-10-CM

## 2015-03-13 MED ORDER — AZITHROMYCIN 250 MG PO TABS: ORAL_TABLET | ORAL | Status: DC

## 2015-03-13 NOTE — Patient Instructions (Addendum)
We recommend that you schedule a mammogram for breast cancer screening. Typically, you do not need a referral to do this. Please contact a local imaging center to schedule your mammogram.  [facility] - [phone]  *ask for the Radiology Department The [facility] ([place]) - [phone] or [phone]  [facility] - [phone] [place] [phone] [facility] - [phone]  *ask for the [facility] - [phone]  *ask for the Radiology Department [facility] - [phone]  *ask for the [facility] - [phone]  For your knee pain - call your orthopaedist to determine next step.  Can also take tylenol in addition to the ibuprofen.   I do see some congestion on your xray, possible early pneumonia.  Start Zpak (antibitoic), mucinex for cough and rechck in next few days if not improving. Return to the clinic or go to the nearest emergency room if any of your symptoms worsen or new symptoms occur. Cough, Adult Coughing is a reflex that clears your throat and your airways. Coughing helps to heal and protect your lungs. It is normal to cough occasionally, but a cough that happens with other symptoms or lasts a long time may be a sign of a condition that needs treatment. A cough may last only 2-3 weeks (acute), or it may last longer than 8 weeks (chronic). CAUSES Coughing is commonly caused by:  Breathing in substances that irritate your lungs.  A viral or bacterial respiratory infection.  Allergies.  Asthma.  Postnasal drip.  Smoking.  Acid backing up from the stomach into the esophagus (gastroesophageal reflux).  Certain medicines.  Chronic lung problems, including COPD (or rarely, lung cancer).  Other medical conditions such as heart failure. HOME CARE INSTRUCTIONS  Pay attention to any changes in your symptoms. Take these  actions to help with your discomfort:  Take medicines only as told by your health care provider.  If you were prescribed an antibiotic medicine, take it as told by your health care provider. Do not stop taking the antibiotic even if you start to feel better.  Talk with your health care provider before you take a cough suppressant medicine.  Drink enough fluid to keep your urine clear or pale yellow.  If the air is dry, use a cold steam vaporizer or humidifier in your bedroom or your home to help loosen secretions.  Avoid anything that causes you to cough at work or at home.  If your cough is worse at night, try sleeping in a semi-upright position.  Avoid cigarette smoke. If you smoke, quit smoking. If you need help quitting, ask your health care provider.  Avoid caffeine.  Avoid alcohol.  Rest as needed. SEEK MEDICAL CARE IF:   You have new symptoms.  You cough up pus.  Your cough does not get better after 2-3 weeks, or your cough gets worse.  You cannot control your cough with suppressant medicines and you are losing sleep.  You develop pain that is getting worse or pain that is not controlled with pain medicines.  You have a fever.  You have unexplained weight loss.  You have night sweats. SEEK IMMEDIATE MEDICAL CARE IF:  You cough up blood.  You have difficulty breathing.  Your heartbeat is very fast.   This information is not intended to replace advice given to you by your health care provider. Make sure you discuss any questions  you have with your health care provider.   Document Released: 10/27/2010 Document Revised: 01/19/2015 Document Reviewed: 07/07/2014 Elsevier Interactive Patient Education 2016 Elsevier Inc.  Chest Wall Pain Chest wall pain is pain in or around the bones and muscles of your chest. Sometimes, an injury causes this pain. Sometimes, the cause may not be known. This pain may take several weeks or longer to get better. HOME CARE  INSTRUCTIONS  Pay attention to any changes in your symptoms. Take these actions to help with your pain:   Rest as told by your health care provider.   Avoid activities that cause pain. These include any activities that use your chest muscles or your abdominal and side muscles to lift heavy items.   If directed, apply ice to the painful area:  Put ice in a plastic bag.  Place a towel between your skin and the bag.  Leave the ice on for 20 minutes, 2-3 times per day.  Take over-the-counter and prescription medicines only as told by your health care provider.  Do not use tobacco products, including cigarettes, chewing tobacco, and e-cigarettes. If you need help quitting, ask your health care provider.  Keep all follow-up visits as told by your health care provider. This is important. SEEK MEDICAL CARE IF:  You have a fever.  Your chest pain becomes worse.  You have new symptoms. SEEK IMMEDIATE MEDICAL CARE IF:  You have nausea or vomiting.  You feel sweaty or light-headed.  You have a cough with phlegm (sputum) or you cough up blood.  You develop shortness of breath.   This information is not intended to replace advice given to you by your health care provider. Make sure you discuss any questions you have with your health care provider.   Document Released: 04/30/2005 Document Revised: 01/19/2015 Document Reviewed: 07/26/2014 Elsevier Interactive Patient Education [employer].

## 2015-03-13 NOTE — Progress Notes (Addendum)
Subjective:  This chart was scribed for Shelia Ray, MD by Moises Blood, Medical Scribe. This patient was seen in room 9 and the patient's care was started 1:40 PM.   Patient ID: Shelia Berg, female    DOB: 26-Dec-1950, 64 y.o.   MRN: [id number]  HPI Shelia Berg is a 64 y.o. female Here for right knee and lower leg pain. She was seen by orthopedics and given injections but the pain has come back.   She was here 3 months ago, and has arthritis in right knee. She was given medication, but didn't help. She was then referred to [facility] 3 months ago and was given injections for relief. But she noticed the pain has come back. She's been taking ibuprofen.   She also complains of a nonproductive cough that's been going on for a week now. When she coughs, she feels some soreness in her back. She denies shortness of breath, fever. She feels that it's improving. She denies taking medication for this. She takes vitamins and herbal supplement.   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   Patient Active Problem List   Diagnosis Date Noted  . Community acquired pneumonia 06/07/2014  . Hypokalemia 06/07/2014  . CAP (community acquired pneumonia) 06/07/2014  . DEGENERATIVE JOINT DISEASE 10/03/2006  . FRACTURE, WRIST, LEFT 05/20/2001  . HX, PERSONAL, VENOUS THROMBOSIS/EMBOLISM 05/15/1987  . TOTAL ABDOMINAL HYSTERECTOMY, HX OF 05/14/1986   Past Medical History  Diagnosis Date  . Bladder cancer ([place]) 6 years ago   Past Surgical History  Procedure Laterality Date  . Abdominal hysterectomy     No Known Allergies Prior to Admission medications   Medication Sig Start Date End Date Taking? Authorizing Provider  Ascorbic Acid (VITAMIN C) 100 MG tablet Take 100 mg by mouth daily.   Yes Historical Provider, MD  aspirin 81 MG tablet Take 81 mg by mouth daily.   Yes Historical Provider, MD  ibuprofen (ADVIL,MOTRIN) 200 MG tablet Take 200 mg by mouth every 6 (six) hours as  needed.   Yes Historical Provider, MD  meloxicam (MOBIC) 15 MG tablet Take 1 tablet (15 mg total) by mouth daily. 11/14/14  Yes Leandrew Koyanagi, MD  Omega-3 Fatty Acids (FISH OIL) 1000 MG CAPS Take 1 capsule by mouth daily.   Yes Historical Provider, MD  traMADol (ULTRAM) 50 MG tablet Take 1-2 tablets (50-100 mg total) by mouth every 6 (six) hours as needed. 11/14/14  Yes Leandrew Koyanagi, MD   Social History   Social History  . Marital Status: Married    Spouse Name: N/A  . Number of Children: N/A  . Years of Education: N/A   Occupational History  . Not on file.   Social History Main Topics  . Smoking status: Former Smoker -- 0.50 packs/day    Types: Cigarettes    Quit date: 06/05/2014  . Smokeless tobacco: Not on file     Comment: Reports she stopped 1 week ago.   . Alcohol Use: No  . Drug Use: No  . Sexual Activity: Yes   Other Topics Concern  . Not on file   Social History Narrative   Review of Systems  Constitutional: Negative for fever.  Respiratory: Positive for cough. Negative for shortness of breath and wheezing.   Cardiovascular: Negative for chest pain.  Musculoskeletal: Positive for arthralgias (right knee).       Objective:   Physical Exam  Constitutional: She is oriented to person, place, and time. She appears  well-developed and well-nourished. No distress.  HENT:  Head: Normocephalic and atraumatic.  Eyes: EOM are normal. Pupils are equal, round, and reactive to light.  Neck: Neck supple.  Cardiovascular: Normal rate.   Pulmonary/Chest: Effort normal. No respiratory distress. She exhibits no tenderness.  Few distant course breath sounds in right lower lobe  Musculoskeletal: Normal range of motion.  Right knee: Skin intact, no erythema, describes pain below knee cap and anterior knee, flexion 90 degrees, full extension, trace effusion, jointline nontender, nvi distally  Neurological: She is alert and oriented to person, place, and time.  Skin: Skin  is warm and dry.  Psychiatric: She has a normal mood and affect. Her behavior is normal.  Nursing note and vitals reviewed.   Filed Vitals:   03/13/15 1229  BP: 124/60  Pulse: 62  Temp: 98.2 F (36.8 C)  TempSrc: Oral  Resp: 16  Height: 5\' 2"  (1.575 m)  Weight: 157 lb 8 oz (71.442 kg)  SpO2: 97%   [facility] reading (PRIMARY) by Dr. Carlota Raspberry : CXR: faint infiltrate vs atelectasis RLL.      Assessment & Plan:   [name] is a 64 y.o. female Cough - Plan: DG Chest 2 View, azithromycin (ZITHROMAX) 250 MG tablet  - 1 week hx, probable initial viral cause. Few possible rhonchi RLL, with possible scar vs infiltrate RLL  -Zpak, mucinex or mucinex DM - sample provided.   -rtc precautions.   Right knee pain  - no new injury. S/p injection by ortho - plans on calling ortho for appt to decide next step. [facility] vs. [facility] possible.   Meds ordered this encounter  Medications  . azithromycin (ZITHROMAX) 250 MG tablet    Sig: Take 2 pills by mouth on day 1, then 1 pill by mouth per day on days 2 through 5.    Dispense:  6 tablet    Refill:  0   Patient Instructions  We recommend that you schedule a mammogram for breast cancer screening. Typically, you do not need a referral to do this. Please contact a local imaging center to schedule your mammogram.  [facility] - [phone]  *ask for the Radiology Department The [place] ([place]) - [phone] or [phone]  [facility] - [phone] [place] [phone] [facility] - [phone]  *ask for the [facility] - [phone]  *ask for the Radiology Department [facility] - [phone]  *ask for the [place] - [phone]  For your knee pain - call your orthopaedist to determine next step.  Can also take tylenol in addition to the ibuprofen.   I do see some congestion on your  xray, possible early pneumonia.  Start Zpak (antibitoic), mucinex for cough and rechck in next few days if not improving. Return to the clinic or go to the nearest emergency room if any of your symptoms worsen or new symptoms occur. Cough, Adult Coughing is a reflex that clears your throat and your airways. Coughing helps to heal and protect your lungs. It is normal to cough occasionally, but a cough that happens with other symptoms or lasts a long time may be a sign of a condition that needs treatment. A cough may last only 2-3 weeks (acute), or it may last longer than 8 weeks (chronic). CAUSES Coughing is commonly caused by:  Breathing in substances that irritate your lungs.  A viral or bacterial respiratory  infection.  Allergies.  Asthma.  Postnasal drip.  Smoking.  Acid backing up from the stomach into the esophagus (gastroesophageal reflux).  Certain medicines.  Chronic lung problems, including COPD (or rarely, lung cancer).  Other medical conditions such as heart failure. HOME CARE INSTRUCTIONS  Pay attention to any changes in your symptoms. Take these actions to help with your discomfort:  Take medicines only as told by your health care provider.  If you were prescribed an antibiotic medicine, take it as told by your health care provider. Do not stop taking the antibiotic even if you start to feel better.  Talk with your health care provider before you take a cough suppressant medicine.  Drink enough fluid to keep your urine clear or pale yellow.  If the air is dry, use a cold steam vaporizer or humidifier in your bedroom or your home to help loosen secretions.  Avoid anything that causes you to cough at work or at home.  If your cough is worse at night, try sleeping in a semi-upright position.  Avoid cigarette smoke. If you smoke, quit smoking. If you need help quitting, ask your health care provider.  Avoid caffeine.  Avoid alcohol.  Rest as needed. SEEK  MEDICAL CARE IF:   You have new symptoms.  You cough up pus.  Your cough does not get better after 2-3 weeks, or your cough gets worse.  You cannot control your cough with suppressant medicines and you are losing sleep.  You develop pain that is getting worse or pain that is not controlled with pain medicines.  You have a fever.  You have unexplained weight loss.  You have night sweats. SEEK IMMEDIATE MEDICAL CARE IF:  You cough up blood.  You have difficulty breathing.  Your heartbeat is very fast.   This information is not intended to replace advice given to you by your health care provider. Make sure you discuss any questions you have with your health care provider.   Document Released: 10/27/2010 Document Revised: 01/19/2015 Document Reviewed: 07/07/2014 Elsevier Interactive Patient Education 2016 Elsevier Inc.  Chest Wall Pain Chest wall pain is pain in or around the bones and muscles of your chest. Sometimes, an injury causes this pain. Sometimes, the cause may not be known. This pain may take several weeks or longer to get better. HOME CARE INSTRUCTIONS  Pay attention to any changes in your symptoms. Take these actions to help with your pain:   Rest as told by your health care provider.   Avoid activities that cause pain. These include any activities that use your chest muscles or your abdominal and side muscles to lift heavy items.   If directed, apply ice to the painful area:  Put ice in a plastic bag.  Place a towel between your skin and the bag.  Leave the ice on for 20 minutes, 2-3 times per day.  Take over-the-counter and prescription medicines only as told by your health care provider.  Do not use tobacco products, including cigarettes, chewing tobacco, and e-cigarettes. If you need help quitting, ask your health care provider.  Keep all follow-up visits as told by your health care provider. This is important. SEEK MEDICAL CARE IF:  You have a  fever.  Your chest pain becomes worse.  You have new symptoms. SEEK IMMEDIATE MEDICAL CARE IF:  You have nausea or vomiting.  You feel sweaty or light-headed.  You have a cough with phlegm (sputum) or you cough up blood.  You develop  shortness of breath.   This information is not intended to replace advice given to you by your health care provider. Make sure you discuss any questions you have with your health care provider.   Document Released: 04/30/2005 Document Revised: 01/19/2015 Document Reviewed: 07/26/2014 Elsevier Interactive Patient Education [employer].       By signing my name below, I, Moises Blood, attest that this documentation has been prepared under the direction and in the presence of Shelia Ray, MD. Electronically Signed: Moises Blood, [place]. 03/13/2015 , 1:40 PM .   I personally performed the services described in this documentation, which was scribed in my presence. The recorded information has been reviewed and considered, and addended by me as needed.

## 2015-07-18 ENCOUNTER — Ambulatory Visit: Payer: Self-pay | Admitting: Sports Medicine

## 2015-07-25 ENCOUNTER — Ambulatory Visit (INDEPENDENT_AMBULATORY_CARE_PROVIDER_SITE_OTHER): Payer: BLUE CROSS/BLUE SHIELD | Admitting: Sports Medicine

## 2015-07-25 ENCOUNTER — Encounter: Payer: Self-pay | Admitting: Sports Medicine

## 2015-07-25 VITALS — BP 134/64 | Ht 64.96 in | Wt 154.3 lb

## 2015-07-25 DIAGNOSIS — M170 Bilateral primary osteoarthritis of knee: Secondary | ICD-10-CM | POA: Diagnosis not present

## 2015-07-25 MED ORDER — TRAMADOL HCL 50 MG PO TABS: ORAL_TABLET | ORAL | Status: DC

## 2015-07-25 NOTE — Progress Notes (Signed)
   Subjective:    Patient ID: Shelia Berg, female    DOB: [birth year], 65 y.o.   MRN: [id number]  HPI Patient presents to [facility] with right knee pain.  She notes that pain started last summer.  She saw her PCP, who referred her to [facility].  She saw the orthopedist in Oct/Nov and had standing films done.  She was told that she had "bad knee arthritis".  A knee injection was performed.  Patient reports that she responded well.  She notes that she started developing right knee pain again about 3 weeks ago.  She reports that the pain is located on the lateral, medial and superior aspects of her knee.  Denies swelling, erythema, weakness, numbness, tingling, locking or popping.  She denies previous injury to knee or RLE.  She has been taking Motrin 600-800mg  prn pain.  She reports that she had to take Motrin every day last week until Friday due to pain. She states that her pain is minimal today and her swelling has improved.  Review of Systems Per HPI    Objective:   Physical Exam Gen: awake, alert, NAD  Examination of each knee shows full range of motion. There is 1+ boggy synovitis in the right knee with a trace effusion. Trace patellofemoral crepitus. No tenderness to palpation along the medial or lateral joint lines. Good ligament stability. Neurovascular intact distally. Walking with a slight limp.  X-rays from July 2016 are reviewed. These are non-standing x-rays. Minimal degenerative changes are seen. Please note that the patient did have standing x-rays done at [facility] where she was told that she had "bad arthritis".     Assessment & Plan:   1. Primary osteoarthritis of both knees. Patient has no pain today.  11/2014 Xrays reviewed.  Do not appear to be standing, though reports standing films at [facility] - Patient to fill out ROI to obtain records from [facility].  Suspect she had steroid injection there. - Compression sleeve provided today - Continue Motrin 600-800mg  up to TID  prn pain/ swelling - Add tramadol 50mg  po q12 prn pain.  - Injection offered today.  Patient wishes to proceed with oral therapies.  Consider repeat steroid injection if persistent pain. - Follow up prn  Patient seen and evaluated with the resident. I agree with the above plan of care. I would like to get the records from [place], primarily to review how much arthritis she has in this right knee. Regardless, she is doing well today. She will continue with her Motrin as needed and we will add tramadol to this. She does not feel like her symptoms are significant enough today to warrant a repeat cortisone injection but she does understand that this is an option in the future if needed. She will wear her compression sleeve with activity and will follow-up with me as needed.

## 2015-07-30 ENCOUNTER — Ambulatory Visit (INDEPENDENT_AMBULATORY_CARE_PROVIDER_SITE_OTHER): Payer: BLUE CROSS/BLUE SHIELD | Admitting: Internal Medicine

## 2015-07-30 VITALS — BP 121/79 | HR 101 | Temp 97.4°F | Resp 18 | Ht 64.0 in | Wt 164.8 lb

## 2015-07-30 DIAGNOSIS — J111 Influenza due to unidentified influenza virus with other respiratory manifestations: Secondary | ICD-10-CM | POA: Diagnosis not present

## 2015-07-30 MED ORDER — OSELTAMIVIR PHOSPHATE 75 MG PO CAPS: 75.0000 mg | ORAL_CAPSULE | Freq: Two times a day (BID) | ORAL | Status: DC

## 2015-07-30 MED ORDER — HYDROCODONE-ACETAMINOPHEN 10-325 MG PO TABS: 1.0000 | ORAL_TABLET | Freq: Three times a day (TID) | ORAL | Status: DC | PRN

## 2015-07-30 NOTE — Patient Instructions (Signed)
     IF you received an x-ray today, you will receive an invoice from [facility]. Please contact [facility] at [phone] with questions or concerns regarding your invoice.   IF you received labwork today, you will receive an invoice from [facility]. Please contact [facility] at [phone] with questions or concerns regarding your invoice.   Our billing staff will not be able to assist you with questions regarding bills from these companies.  You will be contacted with the lab results as soon as they are available. The fastest way to get your results is to activate your My Chart account. Instructions are located on the last page of this paperwork. If you have not heard from us regarding the results in 2 weeks, please contact this office.      

## 2015-07-30 NOTE — Progress Notes (Signed)
Subjective:  By signing my name below, I, Moises Blood, attest that this documentation has been prepared under the direction and in the presence of Tami Lin, MD. Electronically Signed: Moises Blood, [place]. 07/30/2015 , 3:46 PM .  Patient was seen in Room 8 .   Patient ID: Shelia Berg, female    DOB: 1951/03/24, 65 y.o.   MRN: [id number] Chief Complaint  Patient presents with  . Cough    x1 day; possible flu  . Generalized Body Aches  . Sore Throat   HPI Shelia Berg is a 65 y.o. female who presents to [facility] complaining of possible flu that started with dry cough with sore throat and general myalgia noticed yesterday. She denies fever. Her husband has similar symptoms that also started yesterday. She denies any other known sick contacts.   Patient Active Problem List   Diagnosis Date Noted  . Community acquired pneumonia 06/07/2014  . Hypokalemia 06/07/2014  . CAP (community acquired pneumonia) 06/07/2014  . Osteoarthritis 10/03/2006  . FRACTURE, WRIST, LEFT 05/20/2001  . HX, PERSONAL, VENOUS THROMBOSIS/EMBOLISM 05/15/1987  . TOTAL ABDOMINAL HYSTERECTOMY, HX OF 05/14/1986    Current outpatient prescriptions:  .  Ascorbic Acid (VITAMIN C) 100 MG tablet, Take 100 mg by mouth daily., Disp: , Rfl:  .  aspirin 81 MG chewable tablet, Chew by mouth., Disp: , Rfl:  .  aspirin 81 MG tablet, Take 81 mg by mouth daily., Disp: , Rfl:  .  Omega-3 Fatty Acids (FISH OIL) 1000 MG CAPS, Take 1 capsule by mouth daily., Disp: , Rfl:  .  Omega-3 Fatty Acids (FISH OIL) 1000 MG CAPS, Take by mouth., Disp: , Rfl:   Review of Systems  Constitutional: Positive for fatigue. Negative for fever, chills and activity change.  HENT: Positive for sore throat. Negative for congestion.   Respiratory: Positive for cough. Negative for shortness of breath and wheezing.   Gastrointestinal: Negative for nausea, vomiting, abdominal pain and diarrhea.  Musculoskeletal: Positive for myalgias.         Objective:   Physical Exam  Constitutional: She is oriented to person, place, and time. She appears well-developed and well-nourished. No distress.  HENT:  Head: Normocephalic and atraumatic.  Nose: Nose normal.  Mouth/Throat: Posterior oropharyngeal erythema (mildly) present. No oropharyngeal exudate.  Eyes: EOM are normal. Pupils are equal, round, and reactive to light.  Neck: Neck supple.  Cardiovascular: Normal rate, regular rhythm and normal heart sounds.   No murmur heard. Pulmonary/Chest: Effort normal and breath sounds normal. No respiratory distress. She has no wheezes.  Musculoskeletal: Normal range of motion.  Neurological: She is alert and oriented to person, place, and time.  Skin: Skin is warm and dry.  Psychiatric: She has a normal mood and affect. Her behavior is normal.  Nursing note and vitals reviewed.  BP 121/79 mmHg  Pulse 101  Temp(Src) 97.4 F (36.3 C) (Oral)  Resp 18  Ht 5\' 4"  (1.626 m)  Wt 164 lb 12.8 oz (74.753 kg)  BMI 28.27 kg/m2  SpO2 99%    Assessment & Plan:  Flu Meds ordered this encounter  Medications  . oseltamivir (TAMIFLU) 75 MG capsule    Sig: Take 1 capsule (75 mg total) by mouth 2 (two) times daily.    Dispense:  10 capsule    Refill:  0  . HYDROcodone-acetaminophen (NORCO) 10-325 MG tablet    Sig: Take 1 tablet by mouth every 8 (eight) hours as needed.    Dispense:  30 tablet  Refill:  0

## 2015-08-02 ENCOUNTER — Telehealth: Payer: Self-pay | Admitting: Family Medicine

## 2015-08-02 ENCOUNTER — Telehealth: Payer: Self-pay

## 2015-08-02 NOTE — Telephone Encounter (Signed)
Pt asking for cough medication and medication for throat. She states she is not feeling better and been in bed since Saturday. Please advise.

## 2015-08-02 NOTE — Telephone Encounter (Signed)
Pt stated she was not feeling any better and the pain medicine made her sick with vomiting. I advised her to stop the pain med and come in to be rechecked. She refused saying that she wasn't going to wait 5 hours to be seen. I then advised her to go the ER. She stated understanding

## 2015-08-02 NOTE — Telephone Encounter (Signed)
Sounds like her flu case has been complicated by a throat infection, or even possibly a pneumonia We gave her hycodan-is she out? Was it not good enough? We can call in phen DM but not anything with hydrocod in it due to law I'm glad to see her again today or she can come to walkin in the morning

## 2015-08-03 ENCOUNTER — Ambulatory Visit (INDEPENDENT_AMBULATORY_CARE_PROVIDER_SITE_OTHER): Payer: BLUE CROSS/BLUE SHIELD

## 2015-08-03 ENCOUNTER — Ambulatory Visit (INDEPENDENT_AMBULATORY_CARE_PROVIDER_SITE_OTHER): Payer: BLUE CROSS/BLUE SHIELD | Admitting: Family Medicine

## 2015-08-03 VITALS — BP 124/68 | HR 78 | Temp 98.9°F | Resp 16 | Ht 64.0 in | Wt 165.8 lb

## 2015-08-03 DIAGNOSIS — R05 Cough: Secondary | ICD-10-CM

## 2015-08-03 DIAGNOSIS — J111 Influenza due to unidentified influenza virus with other respiratory manifestations: Secondary | ICD-10-CM | POA: Diagnosis not present

## 2015-08-03 DIAGNOSIS — R059 Cough, unspecified: Secondary | ICD-10-CM

## 2015-08-03 DIAGNOSIS — R062 Wheezing: Secondary | ICD-10-CM | POA: Diagnosis not present

## 2015-08-03 LAB — COMPLETE METABOLIC PANEL WITH GFR
ALT: 11 U/L (ref 6–29)
AST: 18 U/L (ref 10–35)
Albumin: 3.9 g/dL (ref 3.6–5.1)
Alkaline Phosphatase: 104 U/L (ref 33–130)
BUN: 13 mg/dL (ref 7–25)
CALCIUM: 9.4 mg/dL (ref 8.6–10.4)
CHLORIDE: 101 mmol/L (ref 98–110)
CO2: 30 mmol/L (ref 20–31)
Creat: 0.77 mg/dL (ref 0.50–0.99)
GFR, Est African American: >89 mL/min (ref >=60)
GFR, Est Non African American: 82 mL/min (ref >=60)
Glucose, Bld: 89 mg/dL (ref 65–99)
POTASSIUM: 5.4 mmol/L — AB (ref 3.5–5.3)
SODIUM: 140 mmol/L (ref 135–146)
Total Bilirubin: 0.3 mg/dL (ref 0.2–1.2)
Total Protein: 7.3 g/dL (ref 6.1–8.1)

## 2015-08-03 LAB — POCT CBC
Granulocyte percent: 61.7 %G (ref 37–80)
HEMATOCRIT: 41.2 % (ref 37.7–47.9)
HEMOGLOBIN: 14.4 g/dL (ref 12.2–16.2)
LYMPH, POC: 2.1 (ref 0.6–3.4)
MCH, POC: 29.7 pg (ref 27–31.2)
MCHC: 35 g/dL (ref 31.8–35.4)
MCV: 85 fL (ref 80–97)
MID (cbc): 0.3 (ref 0–0.9)
MPV: 7.9 fL (ref 0–99.8)
POC GRANULOCYTE: 3.9 (ref 2–6.9)
POC LYMPH %: 33.2 % (ref 10–50)
POC MID %: 5.1 % (ref 0–12)
Platelet Count, POC: 237 10*3/uL (ref 142–424)
RBC: 4.85 M/uL (ref 4.04–5.48)
RDW, POC: 13 %
WBC: 6.3 10*3/uL (ref 4.6–10.2)

## 2015-08-03 NOTE — Progress Notes (Signed)
   HPI  Patient presents today with continued illness.  She explains that for 5 days ago, last Friday, she had sudden onset body aches, cough, and decreased energy. She denies any shortness of breath, chest pain, or difficulty tolerating foods and fluids. She has had decreased appetite. She has a history of severe pneumonia causing hospitalizations that she is very anxious. She requests chest x-ray and blood work today.  She took one dose of Tamiflu and had such severe nausea and vomiting that she could not take it again. As a result her husband did not take it either.   PMH: Smoking status noted ROS: Per HPI  Objective: BP 124/68 mmHg  Pulse 78  Temp(Src) 98.9 F (37.2 C) (Oral)  Resp 16  Ht 5\' 4"  (1.626 m)  Wt 165 lb 12.8 oz (75.206 kg)  BMI 28.45 kg/m2  SpO2 97% Gen: NAD, alert, cooperative with exam HEENT: NCAT, TMs normal bilaterally, nares clear, oropharynx clear CV: RRR, good S1/S2, no murmur Resp: Nonlabored, expiratory wheezes Ext: No edema, warm Neuro: Alert and oriented, No gross deficits  CXR; no infiltrate, no acute process  Assessment and plan:  # Influenza Discussed usual course of illness, supportive care Albuterol inhaler with wheezing Out of work 1 week No CAP Basic labs per request RTC with any concerns    Orders Placed This Encounter  Procedures  . DG Chest 2 View    Standing Status: Future     Number of Occurrences: 1     Standing Expiration Date: 10/02/2016    Order Specific Question:  Reason for Exam (SYMPTOM  OR DIAGNOSIS REQUIRED)    Answer:  eval for CAP    Order Specific Question:  Preferred imaging location?    Answer:  External  . COMPLETE METABOLIC PANEL WITH GFR  . POCT [place], MD [facility] 08/03/2015, 2:44 PM

## 2015-08-03 NOTE — Patient Instructions (Addendum)
Great to meet you and your husband  We will call or send a letter with your results within 1 week or so.  Symptoms usually last 5 to 7 days with the Flu  Influenza, Adult Influenza ("the flu") is a viral infection of the respiratory tract. It occurs more often in winter months because people spend more time in close contact with one another. Influenza can make you feel very sick. Influenza easily spreads from person to person (contagious). CAUSES  Influenza is caused by a virus that infects the respiratory tract. You can catch the virus by breathing in droplets from an infected person's cough or sneeze. You can also catch the virus by touching something that was recently contaminated with the virus and then touching your mouth, nose, or eyes. RISKS AND COMPLICATIONS You may be at risk for a more severe case of influenza if you smoke cigarettes, have diabetes, have chronic heart disease (such as heart failure) or lung disease (such as asthma), or if you have a weakened immune system. Elderly people and pregnant women are also at risk for more serious infections. The most common problem of influenza is a lung infection (pneumonia). Sometimes, this problem can require emergency medical care and may be life threatening. SIGNS AND SYMPTOMS  Symptoms typically last 4 to 10 days and may include:  Fever.  Chills.  Headache, body aches, and muscle aches.  Sore throat.  Chest discomfort and cough.  Poor appetite.  Weakness or feeling tired.  Dizziness.  Nausea or vomiting. DIAGNOSIS  Diagnosis of influenza is often made based on your history and a physical exam. A nose or throat swab test can be done to confirm the diagnosis. TREATMENT  In mild cases, influenza goes away on its own. Treatment is directed at relieving symptoms. For more severe cases, your health care provider may prescribe antiviral medicines to shorten the sickness. Antibiotic medicines are not effective because the infection  is caused by a virus, not by bacteria. HOME CARE INSTRUCTIONS  Take medicines only as directed by your health care provider.  Use a cool mist humidifier to make breathing easier.  Get plenty of rest until your temperature returns to normal. This usually takes 3 to 4 days.  Drink enough fluid to keep your urine clear or pale yellow.  Cover yourmouth and nosewhen coughing or sneezing,and wash your handswellto prevent thevirusfrom spreading.  Stay homefromwork orschool untilthe fever is gonefor at least 62full day. PREVENTION  An annual influenza vaccination (flu shot) is the best way to avoid getting influenza. An annual flu shot is now routinely recommended for all adults in the [place] IF:  You experiencechest pain, yourcough worsens,or you producemore mucus.  Youhave nausea,vomiting, ordiarrhea.  Your fever returns or gets worse. SEEK IMMEDIATE MEDICAL CARE IF:  You havetrouble breathing, you become short of breath,or your skin ornails becomebluish.  You have severe painor stiffnessin the neck.  You develop a sudden headache, or pain in the face or ear.  You have nausea or vomiting that you cannot control. MAKE SURE YOU:   Understand these instructions.  Will watch your condition.  Will get help right away if you are not doing well or get worse.   This information is not intended to replace advice given to you by your health care provider. Make sure you discuss any questions you have with your health care provider.   Document Released: 04/27/2000 Document Revised: 05/21/2014 Document Reviewed: 07/30/2011 Elsevier Interactive Patient Education [employer].  IF you received an x-ray today, you will receive an invoice from [facility]. Please contact [facility] at [phone] with questions or concerns regarding your invoice.   IF you received labwork today, you will receive an invoice from [employer] Please contact [facility] at [phone] with questions or concerns regarding your invoice.   Our billing staff will not be able to assist you with questions regarding bills from these companies.  You will be contacted with the lab results as soon as they are available. The fastest way to get your results is to activate your My Chart account. Instructions are located on the last page of this paperwork. If you have not heard from [place] regarding the results in 2 weeks, please contact this office.

## 2015-08-04 ENCOUNTER — Other Ambulatory Visit: Payer: Self-pay | Admitting: Family Medicine

## 2015-08-04 DIAGNOSIS — E875 Hyperkalemia: Secondary | ICD-10-CM | POA: Insufficient documentation

## 2015-08-11 ENCOUNTER — Telehealth: Payer: Self-pay

## 2015-08-11 NOTE — Telephone Encounter (Signed)
Pt has questions about her lab results   Best number [phone]

## 2015-08-12 NOTE — Telephone Encounter (Signed)
Left message for pt to call back  °

## 2015-10-06 ENCOUNTER — Ambulatory Visit (INDEPENDENT_AMBULATORY_CARE_PROVIDER_SITE_OTHER): Payer: BLUE CROSS/BLUE SHIELD | Admitting: Family Medicine

## 2015-10-06 VITALS — BP 118/60 | HR 101 | Temp 98.3°F | Resp 16 | Ht 63.0 in | Wt 164.0 lb

## 2015-10-06 DIAGNOSIS — J22 Unspecified acute lower respiratory infection: Secondary | ICD-10-CM

## 2015-10-06 DIAGNOSIS — R05 Cough: Secondary | ICD-10-CM | POA: Diagnosis not present

## 2015-10-06 DIAGNOSIS — J988 Other specified respiratory disorders: Secondary | ICD-10-CM | POA: Diagnosis not present

## 2015-10-06 DIAGNOSIS — R062 Wheezing: Secondary | ICD-10-CM

## 2015-10-06 DIAGNOSIS — R059 Cough, unspecified: Secondary | ICD-10-CM

## 2015-10-06 MED ORDER — BENZONATATE 100 MG PO CAPS: 100.0000 mg | ORAL_CAPSULE | Freq: Three times a day (TID) | ORAL | Status: DC | PRN

## 2015-10-06 MED ORDER — AZITHROMYCIN 250 MG PO TABS: ORAL_TABLET | ORAL | Status: DC

## 2015-10-06 MED ORDER — ALBUTEROL SULFATE (2.5 MG/3ML) 0.083% IN NEBU
2.5000 mg | INHALATION_SOLUTION | Freq: Once | RESPIRATORY_TRACT | Status: AC
  Administered 2015-10-06: 2.5 mg via RESPIRATORY_TRACT

## 2015-10-06 MED ORDER — ACETAMINOPHEN ER 650 MG PO TBCR: 650.0000 mg | EXTENDED_RELEASE_TABLET | Freq: Three times a day (TID) | ORAL | Status: DC | PRN

## 2015-10-06 MED ORDER — IPRATROPIUM BROMIDE 0.02 % IN SOLN
0.5000 mg | Freq: Once | RESPIRATORY_TRACT | Status: AC
  Administered 2015-10-06: 0.5 mg via RESPIRATORY_TRACT

## 2015-10-06 MED ORDER — ALBUTEROL SULFATE HFA 108 (90 BASE) MCG/ACT IN AERS: 2.0000 | INHALATION_SPRAY | RESPIRATORY_TRACT | Status: DC | PRN

## 2015-10-06 NOTE — Patient Instructions (Addendum)
Please come back in if you are not better in 3-4 days, come in sooner if you have worsening shortness of breath  Community-Acquired Pneumonia, Adult Pneumonia is an infection of the lungs. There are different types of pneumonia. One type can develop while a person is in a hospital. A different type, called community-acquired pneumonia, develops in people who are not, or have not recently been, in the hospital or other health care facility.  CAUSES Pneumonia may be caused by bacteria, viruses, or funguses. Community-acquired pneumonia is often caused by Streptococcus pneumonia bacteria. These bacteria are often passed from one person to another by breathing in droplets from the cough or sneeze of an infected person. RISK FACTORS The condition is more likely to develop in:  People who havechronic diseases, such as chronic obstructive pulmonary disease (COPD), asthma, congestive heart failure, cystic fibrosis, diabetes, or kidney disease.  People who haveearly-stage or late-stage HIV.  People who havesickle cell disease.  People who havehad their spleen removed (splenectomy).  People who havepoor Human resources officer.  People who havemedical conditions that increase the risk of breathing in (aspirating) secretions their own mouth and nose.   People who havea weakened immune system (immunocompromised).  People who smoke.  People whotravel to areas where pneumonia-causing germs commonly exist.  People whoare around animal habitats or animals that have pneumonia-causing germs, including birds, bats, rabbits, cats, and farm animals. SYMPTOMS Symptoms of this condition include:  Adry cough.  A wet (productive) cough.  Fever.  Sweating.  Chest pain, especially when breathing deeply or coughing.  Rapid breathing or difficulty breathing.  Shortness of breath.  Shaking chills.  Fatigue.  Muscle aches. DIAGNOSIS Your health care provider will take a medical history and  perform a physical exam. You may also have other tests, including:  Imaging studies of your chest, including X-rays.  Tests to check your blood oxygen level and other blood gases.  Other tests on blood, mucus (sputum), fluid around your lungs (pleural fluid), and urine. If your pneumonia is severe, other tests may be done to identify the specific cause of your illness. TREATMENT The type of treatment that you receive depends on many factors, such as the cause of your pneumonia, the medicines you take, and other medical conditions that you have. For most adults, treatment and recovery from pneumonia may occur at home. In some cases, treatment must happen in a hospital. Treatment may include:  Antibiotic medicines, if the pneumonia was caused by bacteria.  Antiviral medicines, if the pneumonia was caused by a virus.  Medicines that are given by mouth or through an IV tube.  Oxygen.  Respiratory therapy. Although rare, treating severe pneumonia may include:  Mechanical ventilation. This is done if you are not breathing well on your own and you cannot maintain a safe blood oxygen level.  Thoracentesis. This procedureremoves fluid around one lung or both lungs to help you breathe better. HOME CARE INSTRUCTIONS  Take over-the-counter and prescription medicines only as told by your health care provider.  Only takecough medicine if you are losing sleep. Understand that cough medicine can prevent your body's natural ability to remove mucus from your lungs.  If you were prescribed an antibiotic medicine, take it as told by your health care provider. Do not stop taking the antibiotic even if you start to feel better.  Sleep in a semi-upright position at night. Try sleeping in a reclining chair, or place a few pillows under your head.  Do not use tobacco products, including  cigarettes, chewing tobacco, and e-cigarettes. If you need help quitting, ask your health care provider.  Drink  enough water to keep your urine clear or pale yellow. This will help to thin out mucus secretions in your lungs. PREVENTION There are ways that you can decrease your risk of developing community-acquired pneumonia. Consider getting a pneumococcal vaccine if:  You are older than 64 years of age.  You are older than 64 years of age and are undergoing cancer treatment, have chronic lung disease, or have other medical conditions that affect your immune system. Ask your health care provider if this applies to you. There are different types and schedules of pneumococcal vaccines. Ask your health care provider which vaccination option is best for you. You may also prevent community-acquired pneumonia if you take these actions:  Get an influenza vaccine every year. Ask your health care provider which type of influenza vaccine is best for you.  Go to the dentist on a regular basis.  Wash your hands often. Use hand sanitizer if soap and water are not available. SEEK MEDICAL CARE IF:  You have a fever.  You are losing sleep because you cannot control your cough with cough medicine. SEEK IMMEDIATE MEDICAL CARE IF:  You have worsening shortness of breath.  You have increased chest pain.  Your sickness becomes worse, especially if you are an older adult or have a weakened immune system.  You cough up blood.   This information is not intended to replace advice given to you by your health care provider. Make sure you discuss any questions you have with your health care provider.   Document Released: 04/30/2005 Document Revised: 01/19/2015 Document Reviewed: 08/25/2014 Elsevier Interactive Patient Education [employer].    We recommend that you schedule a mammogram for breast cancer screening. Typically, you do not need a referral to do this. Please contact a local imaging center to schedule your mammogram.  [facility] - [phone]  *ask for the Radiology Department The  [place] - [phone] or [phone]  [facility] - [phone] [place] [phone] [facility]  - [phone]  *ask for the [facility] - [phone]  *ask for the Radiology Department [facility] - [phone]  *ask for the [place] - [phone]    IF you received an x-ray today, you will receive an invoice from [facility]. Please contact [facility] at [phone] with questions or concerns regarding your invoice.   IF you received labwork today, you will receive an invoice from [employer]. Please contact [facility] at [phone] with questions or concerns regarding your invoice.   Our billing staff will not be able to assist you with questions regarding bills from these companies.  You will be contacted with the lab results as soon as they are available. The fastest way to get your results is to activate your My Chart account. Instructions are located on the last page of this paperwork. If you have not heard from [place] regarding the results in 2 weeks, please contact this office.

## 2015-10-06 NOTE — Progress Notes (Signed)
Subjective:    Patient ID: Shelia Berg, female    DOB: [birth year], 65 y.o.   MRN: [id number]  HPI This is a 65 yo female who presents today with two days of cough and chills. Back and chest are hurting. No SOB. No wheezing. No runny nose, + sore throat, no ear pain. No sick contacts. Coughed all night. Had previous pneumonia.   No medication for symptoms. No history of asthma. Never used an inhaler before.   Past Medical History  Diagnosis Date  . Bladder cancer ([facility]) 6 years ago   Past Surgical History  Procedure Laterality Date  . Abdominal hysterectomy     History reviewed. No pertinent family history. Social History  Substance Use Topics  . Smoking status: Former Smoker -- 0.50 packs/day    Types: Cigarettes    Quit date: 06/05/2014  . Smokeless tobacco: None     Comment: Reports she stopped 1 week ago.   . Alcohol Use: No      Review of Systems Per HPI    Objective:   Physical Exam  Constitutional: She is oriented to person, place, and time. She appears well-developed and well-nourished.  HENT:  Head: Normocephalic and atraumatic.  Right Ear: External ear normal.  Left Ear: External ear normal.  Mouth/Throat: Oropharynx is clear and moist.  Eyes: Conjunctivae are normal.  Neck: Normal range of motion.  Cardiovascular: Regular rhythm and normal heart sounds.  Tachycardia present.   Pulmonary/Chest: Effort normal. She has wheezes (right middle posterior exp wheeze).  Lymphadenopathy:    She has no cervical adenopathy.  Neurological: She is alert and oriented to person, place, and time.  Skin: Skin is warm and dry.  Psychiatric: She has a normal mood and affect. Her behavior is normal. Judgment and thought content normal.  Vitals reviewed.   BP 118/60 mmHg  Pulse 101  Temp(Src) 98.3 F (36.8 C)  Resp 16  Ht 5\' 3"  (1.6 m)  Wt 164 lb (74.39 kg)  BMI 29.06 kg/m2  SpO2 96% Given albuterol/atrovent neb in office with subjective an objective improvement in  symptoms.     Assessment & Plan:  1. Lower respiratory infection - azithromycin (ZITHROMAX) 250 MG tablet; Take 2 today then 1 a day until finished  Dispense: 6 tablet; Refill: 0 - acetaminophen (TYLENOL 8 HOUR) 650 MG CR tablet; Take 1 tablet (650 mg total) by mouth every 8 (eight) hours as needed for pain.  Dispense: 30 tablet; Refill: 1 - albuterol (PROVENTIL) (2.5 MG/3ML) 0.083% nebulizer solution 2.5 mg; Take 3 mLs (2.5 mg total) by nebulization once. - ipratropium (ATROVENT) nebulizer solution 0.5 mg; Take 2.5 mLs (0.5 mg total) by nebulization once.  2. Cough - albuterol (PROVENTIL HFA;VENTOLIN HFA) 108 (90 Base) MCG/ACT inhaler; Inhale 2 puffs into the lungs every 4 (four) hours as needed for wheezing or shortness of breath (cough, shortness of breath or wheezing.).  Dispense: 1 Inhaler; Refill: 1 - albuterol (PROVENTIL) (2.5 MG/3ML) 0.083% nebulizer solution 2.5 mg; Take 3 mLs (2.5 mg total) by nebulization once. - ipratropium (ATROVENT) nebulizer solution 0.5 mg; Take 2.5 mLs (0.5 mg total) by nebulization once. - benzonatate (TESSALON) 100 MG capsule; Take 1-2 capsules (100-200 mg total) by mouth 3 (three) times daily as needed for cough.  Dispense: 40 capsule; Refill: 0  3. Wheeze - albuterol (PROVENTIL HFA;VENTOLIN HFA) 108 (90 Base) MCG/ACT inhaler; Inhale 2 puffs into the lungs every 4 (four) hours as needed for wheezing or shortness of breath (cough, shortness of  breath or wheezing.).  Dispense: 1 Inhaler; Refill: 1 - albuterol (PROVENTIL) (2.5 MG/3ML) 0.083% nebulizer solution 2.5 mg; Take 3 mLs (2.5 mg total) by nebulization once. - ipratropium (ATROVENT) nebulizer solution 0.5 mg; Take 2.5 mLs (0.5 mg total) by nebulization once.  - Provided written and verbal information regarding diagnosis and treatment. - RTC precautions reviewed  Clarene Reamer, FNP-BC  [facility]  10/08/2015 5:24 PM

## 2015-10-11 ENCOUNTER — Ambulatory Visit (INDEPENDENT_AMBULATORY_CARE_PROVIDER_SITE_OTHER): Payer: BLUE CROSS/BLUE SHIELD | Admitting: Family Medicine

## 2015-10-11 VITALS — BP 124/80 | HR 89 | Temp 97.7°F | Resp 16 | Ht 63.0 in | Wt 165.2 lb

## 2015-10-11 DIAGNOSIS — R05 Cough: Secondary | ICD-10-CM | POA: Diagnosis not present

## 2015-10-11 DIAGNOSIS — R059 Cough, unspecified: Secondary | ICD-10-CM

## 2015-10-11 MED ORDER — HYDROCODONE-HOMATROPINE 5-1.5 MG/5ML PO SYRP: ORAL_SOLUTION | ORAL | Status: DC

## 2015-10-11 NOTE — Patient Instructions (Addendum)
IF you received an x-ray today, you will receive an invoice from [facility]. Please contact [facility] at [phone] with questions or concerns regarding your invoice.   IF you received labwork today, you will receive an invoice from [employer]. Please contact [facility] at [phone] with questions or concerns regarding your invoice.   Our billing staff will not be able to assist you with questions regarding bills from these companies.  You will be contacted with the lab results as soon as they are available. The fastest way to get your results is to activate your My Chart account. Instructions are located on the last page of this paperwork. If you have not heard from [place] regarding the results in 2 weeks, please contact this office.    Honey, Cepacol or other lozenge to help with cough, or tessalon perles during the day. Hydrocodone cough syrup at night IF you are not wheezing or short of breath. Use albuterol if needed for cough or wheeze.  If any fever or worsening symptoms - return for recheck.   Cough, Adult Coughing is a reflex that clears your throat and your airways. Coughing helps to heal and protect your lungs. It is normal to cough occasionally, but a cough that happens with other symptoms or lasts a long time may be a sign of a condition that needs treatment. A cough may last only 2-3 weeks (acute), or it may last longer than 8 weeks (chronic). CAUSES Coughing is commonly caused by:  Breathing in substances that irritate your lungs.  A viral or bacterial respiratory infection.  Allergies.  Asthma.  Postnasal drip.  Smoking.  Acid backing up from the stomach into the esophagus (gastroesophageal reflux).  Certain medicines.  Chronic lung problems, including COPD (or rarely, lung cancer).  Other medical conditions such as heart failure. HOME CARE INSTRUCTIONS  Pay attention to any changes in your symptoms. Take  these actions to help with your discomfort:  Take medicines only as told by your health care provider.  If you were prescribed an antibiotic medicine, take it as told by your health care provider. Do not stop taking the antibiotic even if you start to feel better.  Talk with your health care provider before you take a cough suppressant medicine.  Drink enough fluid to keep your urine clear or pale yellow.  If the air is dry, use a cold steam vaporizer or humidifier in your bedroom or your home to help loosen secretions.  Avoid anything that causes you to cough at work or at home.  If your cough is worse at night, try sleeping in a semi-upright position.  Avoid cigarette smoke. If you smoke, quit smoking. If you need help quitting, ask your health care provider.  Avoid caffeine.  Avoid alcohol.  Rest as needed. SEEK MEDICAL CARE IF:   You have new symptoms.  You cough up pus.  Your cough does not get better after 2-3 weeks, or your cough gets worse.  You cannot control your cough with suppressant medicines and you are losing sleep.  You develop pain that is getting worse or pain that is not controlled with pain medicines.  You have a fever.  You have unexplained weight loss.  You have night sweats. SEEK IMMEDIATE MEDICAL CARE IF:  You cough up blood.  You have difficulty breathing.  Your heartbeat is very fast.   This information is not intended to replace advice given to you by your health care provider.  Make sure you discuss any questions you have with your health care provider.   Document Released: 10/27/2010 Document Revised: 01/19/2015 Document Reviewed: 07/07/2014 Elsevier Interactive Patient Education [employer].

## 2015-10-11 NOTE — Progress Notes (Addendum)
By signing my name below I, Tereasa Coop, attest that this documentation has been prepared under the direction and in the presence of Wendie Agreste, MD. Electonically Signed. Tereasa Coop, Scribe 10/11/2015 at 9:25 AM  Subjective:    Patient ID: Shelia Berg, female    DOB: 07/29/1950, 65 y.o.   MRN: [id number]  Chief Complaint  Patient presents with  . Follow-up    Cough. Pt states medication is not working.     HPI Shelia Berg is a 65 y.o. female who presents to the [facility] complaining of cough for the past week. Pt was initially seen at [facility] 5 days ago for cough. Pt was a febrile at that time. Pt has history of pneumonia Jan 2016. Pt was treated with Z-pack and albuterol inhaler. Pt's wheezing in office improved with albuterol and Atrovent neb. Pt also given Tessalon pearls.  Pt states her breathing is improved. Pt states her cough is dry, persistent, and worse at night despite taking the tessalon pearls. Pt denies wheezing currently. Pt denies fever. Pt denies SOB, post nasal drip, or calf tenderness.  Patient Active Problem List   Diagnosis Date Noted  . Hyperkalemia 08/04/2015  . Community acquired pneumonia 06/07/2014  . Hypokalemia 06/07/2014  . CAP (community acquired pneumonia) 06/07/2014  . Osteoarthritis 10/03/2006  . FRACTURE, WRIST, LEFT 05/20/2001  . HX, PERSONAL, VENOUS THROMBOSIS/EMBOLISM 05/15/1987  . TOTAL ABDOMINAL HYSTERECTOMY, HX OF 05/14/1986   Past Medical History  Diagnosis Date  . Bladder cancer ([place]) 6 years ago   Past Surgical History  Procedure Laterality Date  . Abdominal hysterectomy     No Known Allergies Prior to Admission medications   Medication Sig Start Date End Date Taking? Authorizing Provider  acetaminophen (TYLENOL 8 HOUR) 650 MG CR tablet Take 1 tablet (650 mg total) by mouth every 8 (eight) hours as needed for pain. 10/06/15  Yes Elby Beck, FNP  albuterol (PROVENTIL HFA;VENTOLIN HFA) 108 (90 Base)  MCG/ACT inhaler Inhale 2 puffs into the lungs every 4 (four) hours as needed for wheezing or shortness of breath (cough, shortness of breath or wheezing.). 10/06/15  Yes Elby Beck, FNP  Ascorbic Acid (VITAMIN C) 100 MG tablet Take 100 mg by mouth daily.   Yes Historical Provider, MD  aspirin 81 MG tablet Take 81 mg by mouth daily.   Yes Historical Provider, MD  azithromycin (ZITHROMAX) 250 MG tablet Take 2 today then 1 a day until finished 10/06/15  Yes Elby Beck, FNP  benzonatate (TESSALON) 100 MG capsule Take 1-2 capsules (100-200 mg total) by mouth 3 (three) times daily as needed for cough. 10/06/15  Yes Elby Beck, FNP  Omega-3 Fatty Acids (FISH OIL) 1000 MG CAPS Take by mouth.   Yes Historical Provider, MD   Social History   Social History  . Marital Status: Married    Spouse Name: N/A  . Number of Children: N/A  . Years of Education: N/A   Occupational History  . Not on file.   Social History Main Topics  . Smoking status: Former Smoker -- 0.50 packs/day    Types: Cigarettes    Quit date: 06/05/2014  . Smokeless tobacco: Not on file     Comment: Reports she stopped 1 week ago.   . Alcohol Use: No  . Drug Use: No  . Sexual Activity: Yes   Other Topics Concern  . Not on file   Social History Narrative  Review of Systems  Constitutional: Negative for fever.  HENT: Negative for congestion.   Respiratory: Positive for cough. Negative for shortness of breath and wheezing.   Musculoskeletal: Negative for myalgias.       Objective:   Physical Exam  Constitutional: She is oriented to person, place, and time. She appears well-developed and well-nourished. No distress.  HENT:  Head: Normocephalic and atraumatic.  Right Ear: Hearing, tympanic membrane, external ear and ear canal normal.  Left Ear: Hearing, tympanic membrane, external ear and ear canal normal.  Nose: Nose normal.  Mouth/Throat: Oropharynx is clear and moist. No oropharyngeal  exudate.  Eyes: Conjunctivae and EOM are normal. Pupils are equal, round, and reactive to light.  Cardiovascular: Normal rate, regular rhythm, normal heart sounds and intact distal pulses.   No murmur heard. Pulmonary/Chest: Effort normal and breath sounds normal. No accessory muscle usage. No respiratory distress. She has no decreased breath sounds. She has no wheezes. She has no rhonchi. She has no rales.  Musculoskeletal: She exhibits no edema (lower extremity) or tenderness (calf).  Neurological: She is alert and oriented to person, place, and time.  Skin: Skin is warm and dry. No rash noted.  Psychiatric: She has a normal mood and affect. Her behavior is normal.  Vitals reviewed.    Filed Vitals:   10/11/15 0900  BP: 124/80  Pulse: 89  Temp: 97.7 F (36.5 C)  TempSrc: Oral  Resp: 16  Height: 5\' 3"  (1.6 m)  Weight: 165 lb 3.2 oz (74.934 kg)  SpO2: 95%         Assessment & Plan:   Shelia Berg is a 65 y.o. female Cough - Plan: HYDROcodone-homatropine (HYCODAN) 5-1.5 MG/5ML syrup  - LRTI, afebrile, dry cough. No wheeze and reassuring exam.   - tessalon, cepacol, honey otc discussed.   -hycodan at night if needed. Side effects discussed, advised not to use if wheezing/dyspnea (albuterol if needed)  -RTC precautions.   Meds ordered this encounter  Medications  . HYDROcodone-homatropine (HYCODAN) 5-1.5 MG/5ML syrup    Sig: 57m by mouth a bedtime as needed for cough.    Dispense:  120 mL    Refill:  0   Patient Instructions       IF you received an x-ray today, you will receive an invoice from [facility]. Please contact [facility] at [phone] with questions or concerns regarding your invoice.   IF you received labwork today, you will receive an invoice from [employer]. Please contact [facility] at [phone] with questions or concerns regarding your invoice.   Our billing staff will not be able to assist you with  questions regarding bills from these companies.  You will be contacted with the lab results as soon as they are available. The fastest way to get your results is to activate your My Chart account. Instructions are located on the last page of this paperwork. If you have not heard from [place] regarding the results in 2 weeks, please contact this office.    Honey, Cepacol or other lozenge to help with cough, or tessalon perles during the day. Hydrocodone cough syrup at night IF you are not wheezing or short of breath. Use albuterol if needed for cough or wheeze.  If any fever or worsening symptoms - return for recheck.   Cough, Adult Coughing is a reflex that clears your throat and your airways. Coughing helps to heal and protect your lungs. It is normal to cough occasionally, but a cough that  happens with other symptoms or lasts a long time may be a sign of a condition that needs treatment. A cough may last only 2-3 weeks (acute), or it may last longer than 8 weeks (chronic). CAUSES Coughing is commonly caused by:  Breathing in substances that irritate your lungs.  A viral or bacterial respiratory infection.  Allergies.  Asthma.  Postnasal drip.  Smoking.  Acid backing up from the stomach into the esophagus (gastroesophageal reflux).  Certain medicines.  Chronic lung problems, including COPD (or rarely, lung cancer).  Other medical conditions such as heart failure. HOME CARE INSTRUCTIONS  Pay attention to any changes in your symptoms. Take these actions to help with your discomfort:  Take medicines only as told by your health care provider.  If you were prescribed an antibiotic medicine, take it as told by your health care provider. Do not stop taking the antibiotic even if you start to feel better.  Talk with your health care provider before you take a cough suppressant medicine.  Drink enough fluid to keep your urine clear or pale yellow.  If the air is dry, use a cold steam  vaporizer or humidifier in your bedroom or your home to help loosen secretions.  Avoid anything that causes you to cough at work or at home.  If your cough is worse at night, try sleeping in a semi-upright position.  Avoid cigarette smoke. If you smoke, quit smoking. If you need help quitting, ask your health care provider.  Avoid caffeine.  Avoid alcohol.  Rest as needed. SEEK MEDICAL CARE IF:   You have new symptoms.  You cough up pus.  Your cough does not get better after 2-3 weeks, or your cough gets worse.  You cannot control your cough with suppressant medicines and you are losing sleep.  You develop pain that is getting worse or pain that is not controlled with pain medicines.  You have a fever.  You have unexplained weight loss.  You have night sweats. SEEK IMMEDIATE MEDICAL CARE IF:  You cough up blood.  You have difficulty breathing.  Your heartbeat is very fast.   This information is not intended to replace advice given to you by your health care provider. Make sure you discuss any questions you have with your health care provider.   Document Released: 10/27/2010 Document Revised: 01/19/2015 Document Reviewed: 07/07/2014 Elsevier Interactive Patient Education [employer].     I personally performed the services described in this documentation, which was scribed in my presence. The recorded information has been reviewed and considered, and addended by me as needed.   Signed,   [name], MD [facility].  10/11/2015 12:21 PM

## 2015-12-05 ENCOUNTER — Ambulatory Visit (INDEPENDENT_AMBULATORY_CARE_PROVIDER_SITE_OTHER): Payer: BLUE CROSS/BLUE SHIELD | Admitting: Sports Medicine

## 2015-12-05 ENCOUNTER — Encounter: Payer: Self-pay | Admitting: Sports Medicine

## 2015-12-05 VITALS — BP 124/60 | Ht 64.57 in | Wt 150.0 lb

## 2015-12-05 DIAGNOSIS — M170 Bilateral primary osteoarthritis of knee: Secondary | ICD-10-CM

## 2015-12-05 DIAGNOSIS — M79643 Pain in unspecified hand: Secondary | ICD-10-CM | POA: Diagnosis not present

## 2015-12-05 MED ORDER — PREDNISONE 10 MG PO TABS: ORAL_TABLET | ORAL | 0 refills | Status: DC

## 2015-12-05 NOTE — Progress Notes (Signed)
   Subjective:    Patient ID: Shelia Berg, female    DOB: 09/16/1950, 65 y.o.   MRN: [id number]  HPI chief complaint: Bilateral hand, bilateral knee, and bilateral shoulder pain  65 year old female comes in today complaining of pain in multiple areas. She has a history of osteoarthritis. She typically takes Motrin which has begun to lose its effectiveness. Her hand pain is diffuse. She notes difficulty in gripping. Knee pain is diffuse. She gets intermittent swelling. No trauma. She is here today with her daughter.  Review of Systems    as above Objective:   Physical Exam  Well-developed, well-nourished. No acute distress  Examination of each hand shows an inability to make a complete fist. Mild swelling diffusely. No deformity. Neurovascular intact distally Examination of each knee shows range of motion from 0-120. 1+ boggy synovitis. 1+ patellofemoral crepitus. Mild tenderness to palpation diffusely. Neurovascular intact distally.      Assessment & Plan:   Osteoarthritis  Since the patient has pain in multiple joints, I decided to put her on a 6 day Sterapred Dosepak. Follow-up with me in one week for reevaluation. I'll consider starting meloxicam at follow-up.

## 2015-12-05 NOTE — Progress Notes (Deleted)
   [facility] Kerrin Mo, MD Phone: [phone]  Reason For Visit:   # *** -   Past Medical History Reviewed problem list.  Medications- reviewed and updated No additions to family history Social history- patient is a *** smoker  Objective: Ht 5' 4.57" (1.64 m)   Wt 150 lb (68 kg)   BMI 25.30 kg/m  Gen: NAD, alert, cooperative with exam HEENT: NCAT, EOMI, PERRL, TMs nml Neck: FROM, supple CV: RRR, good S1/S2, no murmur, cap refill <3 Resp: CTABL, no wheezes, non-labored Abd: SNTND, BS present, no guarding or organomegaly Ext: No edema, warm, normal tone, moves UE/LE spontaneously Neuro: Alert and oriented, No gross deficits Skin: no rashes no lesions  Assessment/Plan: See problem based a/p

## 2015-12-12 ENCOUNTER — Ambulatory Visit (INDEPENDENT_AMBULATORY_CARE_PROVIDER_SITE_OTHER): Payer: BLUE CROSS/BLUE SHIELD | Admitting: Sports Medicine

## 2015-12-12 ENCOUNTER — Encounter: Payer: Self-pay | Admitting: Sports Medicine

## 2015-12-12 VITALS — BP 129/66 | HR 86 | Ht 63.0 in | Wt 150.0 lb

## 2015-12-12 DIAGNOSIS — M170 Bilateral primary osteoarthritis of knee: Secondary | ICD-10-CM

## 2015-12-12 MED ORDER — MELOXICAM 15 MG PO TABS: ORAL_TABLET | ORAL | 2 refills | Status: DC

## 2015-12-12 NOTE — Progress Notes (Signed)
  Patient comes in today for a quick follow-up on osteoarthritis pain. Pain has resolved after a 6 day Sterapred Dosepak. I've given her a prescription for meloxicam 15 mg to take daily as needed. I've explained to her that I do not want her to take it every single day but only on those days that she feels like she really needs it. She understands. She will follow-up as needed.  Total time spent with the patient was 10 minutes with greater than 50% of the time spent in face-to-face consultation discussing her diagnosis, prognosis, and treatment.

## 2016-01-17 ENCOUNTER — Ambulatory Visit (INDEPENDENT_AMBULATORY_CARE_PROVIDER_SITE_OTHER): Payer: BLUE CROSS/BLUE SHIELD | Admitting: Physician Assistant

## 2016-01-17 ENCOUNTER — Ambulatory Visit (INDEPENDENT_AMBULATORY_CARE_PROVIDER_SITE_OTHER): Payer: BLUE CROSS/BLUE SHIELD

## 2016-01-17 VITALS — BP 132/80 | HR 81 | Temp 98.0°F | Resp 17 | Ht 63.0 in | Wt 166.0 lb

## 2016-01-17 DIAGNOSIS — H6591 Unspecified nonsuppurative otitis media, right ear: Secondary | ICD-10-CM

## 2016-01-17 DIAGNOSIS — R071 Chest pain on breathing: Secondary | ICD-10-CM

## 2016-01-17 DIAGNOSIS — R079 Chest pain, unspecified: Secondary | ICD-10-CM | POA: Diagnosis not present

## 2016-01-17 DIAGNOSIS — R0781 Pleurodynia: Secondary | ICD-10-CM | POA: Diagnosis not present

## 2016-01-17 LAB — POCT CBC
GRANULOCYTE PERCENT: 68.5 % (ref 37–80)
HEMATOCRIT: 40.2 % (ref 37.7–47.9)
HEMOGLOBIN: 13.5 g/dL (ref 12.2–16.2)
LYMPH, POC: 1.9 (ref 0.6–3.4)
MCH, POC: 28 pg (ref 27–31.2)
MCHC: 33.5 g/dL (ref 31.8–35.4)
MCV: 83.7 fL (ref 80–97)
MID (cbc): 0.4 (ref 0–0.9)
MPV: 7.7 fL (ref 0–99.8)
POC GRANULOCYTE: 4.9 (ref 2–6.9)
POC LYMPH PERCENT: 26.6 %L (ref 10–50)
POC MID %: 4.9 % (ref 0–12)
Platelet Count, POC: 255 10*3/uL (ref 142–424)
RBC: 4.81 M/uL (ref 4.04–5.48)
RDW, POC: 14.1 %
WBC: 7.2 10*3/uL (ref 4.6–10.2)

## 2016-01-17 LAB — POCT SEDIMENTATION RATE: POCT SED RATE: 57 mm/hr — AB (ref 0–22)

## 2016-01-17 MED ORDER — FLUTICASONE PROPIONATE 50 MCG/ACT NA SUSP: 2.0000 | Freq: Every day | NASAL | 0 refills | Status: DC

## 2016-01-17 MED ORDER — NAPROXEN 500 MG PO TABS: 500.0000 mg | ORAL_TABLET | Freq: Two times a day (BID) | ORAL | 0 refills | Status: DC

## 2016-01-17 NOTE — Patient Instructions (Addendum)
Take naproxen 500mg  twice a day for the next few days for pain.  Follow up for annual physical exam this week.   We will call you with CT scan appointment for tomorrow.  I have put in a referral for you to see  pulmonology.   If your symptoms worsen or you develop any new concerning symptoms, seek care sooner.  Use flonase daily for congestion and see if this helps with vertigo symptoms.    IF you received an x-ray today, you will receive an invoice from [facility]. Please contact [facility] at [phone] with questions or concerns regarding your invoice.   IF you received labwork today, you will receive an invoice from [employer]. Please contact [facility] at [phone] with questions or concerns regarding your invoice.   Our billing staff will not be able to assist you with questions regarding bills from these companies.  You will be contacted with the lab results as soon as they are available. The fastest way to get your results is to activate your My Chart account. Instructions are located on the last page of this paperwork. If you have not heard from [place] regarding the results in 2 weeks, please contact this office.

## 2016-01-17 NOTE — Progress Notes (Signed)
Shelia Berg  MRN: [id number] DOB: 1950-11-11  Subjective:  Shelia Berg is a 65 y.o. female seen in office today for a chief complaint of right pleuritic chest pain x 4 days. Notes the pain is in the right lower anterior and posterior thoracic region when she takes a deep breath. She cannot reproduce the pain with movement. She has no other associated symptoms.  Denies any acute injury, lower leg swelling, difficulty breathing, recent travel or immobilization, hormonal replacement therapy, chest pain with exercise, nausea, palpitations, SOB, difficulty breathing, hemoptysis, and diaphoresis. She has not tried anything for relief. Pt does have a history of pneumonia. Last episode was in 05/2014. She has a history of bladder cancer in 2011. Followed closely by oncologist. She is a former smoker, quit in 05/2014. She smoked 0.5 pack for 30 years. Pt has no family history of blood clots or heart disease that she is aware of.   Review of Systems  Constitutional: Negative for fatigue and fever.  HENT: Positive for ear pain (intermittent right ear pain). Negative for congestion.   Respiratory: Negative for cough and wheezing.   Cardiovascular: Negative for palpitations.  Gastrointestinal: Negative for abdominal pain, nausea and vomiting.  Genitourinary: Negative for difficulty urinating and dysuria.  Allergic/Immunologic: Negative for environmental allergies.  Neurological: Positive for dizziness (2 episodes at night time over the past two nights felt as if room was spinning, both episodes have resolved within a few minutes, not present today  ). Negative for weakness.    Patient Active Problem List   Diagnosis Date Noted  . Hyperkalemia 08/04/2015  . Community acquired pneumonia 06/07/2014  . Hypokalemia 06/07/2014  . CAP (community acquired pneumonia) 06/07/2014  . Osteoarthritis 10/03/2006  . FRACTURE, WRIST, LEFT 05/20/2001  . HX, PERSONAL, VENOUS THROMBOSIS/EMBOLISM 05/15/1987  . TOTAL  ABDOMINAL HYSTERECTOMY, HX OF 05/14/1986    Current Outpatient Prescriptions on File Prior to Visit  Medication Sig Dispense Refill  . albuterol (PROVENTIL HFA;VENTOLIN HFA) 108 (90 Base) MCG/ACT inhaler Inhale 2 puffs into the lungs every 4 (four) hours as needed for wheezing or shortness of breath (cough, shortness of breath or wheezing.). 1 Inhaler 1  . aspirin 81 MG tablet Take 81 mg by mouth daily.    . benzonatate (TESSALON) 100 MG capsule Take 1-2 capsules (100-200 mg total) by mouth 3 (three) times daily as needed for cough. 40 capsule 0  . HYDROcodone-homatropine (HYCODAN) 5-1.5 MG/5ML syrup 25m by mouth a bedtime as needed for cough. 120 mL 0  . meloxicam (MOBIC) 15 MG tablet Take daily as needed 40 tablet 2  . Omega-3 Fatty Acids (FISH OIL) 1000 MG CAPS Take by mouth.    [name] acetaminophen (TYLENOL 8 HOUR) 650 MG CR tablet Take 1 tablet (650 mg total) by mouth every 8 (eight) hours as needed for pain. (Patient not taking: Reported on 01/17/2016) 30 tablet 1  . Ascorbic Acid (VITAMIN C) 100 MG tablet Take 100 mg by mouth daily.    [name] azithromycin (ZITHROMAX) 250 MG tablet Take 2 today then 1 a day until finished (Patient not taking: Reported on 01/17/2016) 6 tablet 0  . predniSONE (DELTASONE) 10 MG tablet Take as directed (Patient not taking: Reported on 01/17/2016) 21 tablet 0   No current facility-administered medications on file prior to visit.     No Known Allergies  Past Medical History:  Diagnosis Date  . Bladder cancer ([place]) 6 years ago   Social History   Social History  . Marital status: Married  Spouse name: N/A  . Number of children: N/A  . Years of education: N/A   Occupational History  . Not on file.   Social History Main Topics  . Smoking status: Former Smoker    Packs/day: 0.50    Years: 30.00    Types: Cigarettes    Quit date: 06/05/2014  . Smokeless tobacco: Never Used     Comment: Reports she stopped 1 week ago.   . Alcohol use No  . Drug use: No  .  Sexual activity: Yes   Other Topics Concern  . Not on file   Social History Narrative  . No narrative on file    Objective:  BP 132/80 (BP Location: Right Arm, Patient Position: Sitting, Cuff Size: Normal)   Pulse 81   Temp 98 F (36.7 C) (Oral)   Resp 17   Ht 5\' 3"  (1.6 m)   Wt 166 lb (75.3 kg)   SpO2 97%   BMI 29.41 kg/m   Physical Exam  Constitutional: She is oriented to person, place, and time and well-developed, well-nourished, and in no distress.  HENT:  Head: Normocephalic and atraumatic.  Right Ear: External ear and ear canal normal. A middle ear effusion is present.  Left Ear: Tympanic membrane, external ear and ear canal normal.  Nose: Mucosal edema ( more prominent on right side) present.  Eyes: Conjunctivae are normal.  Neck: Normal range of motion.  Cardiovascular: Normal rate, regular rhythm, normal heart sounds and intact distal pulses.   Pulmonary/Chest: Effort normal. She has wheezes ( expiratory wheezes diffusely in posterior lung fileds).    No reproducible pain with palpation.    Neurological: She is alert and oriented to person, place, and time. Gait normal.  Skin: Skin is warm and dry.  Psychiatric: Affect normal.  Vitals reviewed.  Results for orders placed or performed in visit on 01/17/16 (from the past 24 hour(s))  POCT CBC     Status: None   Collection Time: 01/17/16  1:05 PM  Result Value Ref Range   WBC 7.2 4.6 - 10.2 K/uL   Lymph, poc 1.9 0.6 - 3.4   POC LYMPH PERCENT 26.6 10 - 50 %L   MID (cbc) 0.4 0 - 0.9   POC MID % 4.9 0 - 12 %M   POC Granulocyte 4.9 2 - 6.9   Granulocyte percent 68.5 37 - 80 %G   RBC 4.81 4.04 - 5.48 M/uL   Hemoglobin 13.5 12.2 - 16.2 g/dL   HCT, POC 40.2 37.7 - 47.9 %   MCV 83.7 80 - 97 fL   MCH, POC 28.0 27 - 31.2 pg   MCHC 33.5 31.8 - 35.4 g/dL   RDW, POC 14.1 %   Platelet Count, POC 255 142 - 424 K/uL   MPV 7.7 0 - 99.8 fL  POCT SEDIMENTATION RATE     Status: Abnormal   Collection Time: 01/17/16   2:04 PM  Result Value Ref Range   POCT SED RATE 57 (A) 0 - 22 mm/hr   Dg Chest 2 View  Result Date: 01/17/2016 CLINICAL DATA:  Right-sided chest pain for several days, initial encounter EXAM: CHEST  2 VIEW COMPARISON:  08/03/2015 FINDINGS: Cardiac shadow is stable. The lungs are well aerated bilaterally. A small new right pleural effusion is noted. No focal confluent infiltrate is seen. No acute bony abnormality is noted. Chronic compression deformity in the lower thoracic spine is seen. IMPRESSION: New right pleural effusion likely related to the patient's underlying  discomfort. Electronically Signed   By: Inez Catalina M.D.   On: 01/17/2016 12:35   EKG shows NSR  Assessment and Plan :   1. Pleuritic chest pain  - DG Chest 2 View; Future shows new small pleural effusion. Instructed that she needs to have a CT scan of the chest for further evaluation. Pt has refused to go get a CT scan today because she states she has too much to do. She has been informed that without a CT scan of the chest, we cannot rule out life threatening disorders. Pt understands and states she will get the CT scan tomorrow.  - EKG 12-Lead - POCT CBC - POCT SEDIMENTATION RATE - CT Chest W Contrast; Future;  - naproxen (NAPROSYN) 500 MG tablet; Take 1 tablet (500 mg total) by mouth 2 (two) times daily with a meal.  Dispense: 30 tablet; Refill: 0 - Ambulatory referral to Pulmonology -Pt informed that if she experiences any worsening chest pain or any new symptoms such as SOB, difficulty breathing, palpitations, or dizziness to seek care immediately  -Pt will follow up here for repeat CXR in one week if pulmonology appointment has not been scheduled before then.   2. Middle ear effusion, right -Flonase prescribed   Pt to follow up in 2 days for annual physical exam and labs   Tenna Delaine PA-C  [facility] 01/18/2016 11:23 AM

## 2016-01-18 ENCOUNTER — Encounter: Payer: Self-pay | Admitting: Physician Assistant

## 2016-01-19 ENCOUNTER — Ambulatory Visit (INDEPENDENT_AMBULATORY_CARE_PROVIDER_SITE_OTHER): Payer: BLUE CROSS/BLUE SHIELD | Admitting: Physician Assistant

## 2016-01-19 ENCOUNTER — Other Ambulatory Visit: Payer: Self-pay | Admitting: *Deleted

## 2016-01-19 ENCOUNTER — Telehealth: Payer: Self-pay

## 2016-01-19 ENCOUNTER — Ambulatory Visit (HOSPITAL_COMMUNITY)
Admission: RE | Admit: 2016-01-19 | Discharge: 2016-01-19 | Disposition: A | Discharge status: Home / Self Care | Payer: BLUE CROSS/BLUE SHIELD | Source: Ambulatory Visit | Attending: Physician Assistant | Admitting: Physician Assistant

## 2016-01-19 DIAGNOSIS — R911 Solitary pulmonary nodule: Secondary | ICD-10-CM

## 2016-01-19 DIAGNOSIS — R918 Other nonspecific abnormal finding of lung field: Secondary | ICD-10-CM | POA: Diagnosis not present

## 2016-01-19 DIAGNOSIS — J90 Pleural effusion, not elsewhere classified: Secondary | ICD-10-CM | POA: Insufficient documentation

## 2016-01-19 DIAGNOSIS — E875 Hyperkalemia: Secondary | ICD-10-CM

## 2016-01-19 DIAGNOSIS — R0781 Pleurodynia: Secondary | ICD-10-CM

## 2016-01-19 DIAGNOSIS — I700 Atherosclerosis of aorta: Secondary | ICD-10-CM | POA: Diagnosis not present

## 2016-01-19 NOTE — Telephone Encounter (Signed)
Pt's daughter contacted. Allergy to contrast is "stopped breathing on table" so we will do a CT chest without contrast. Also, put in a order for a d-dimer. Pt is to come in for lab draw today or tomorrow.   Tenna Delaine, PA-C  [facility] 01/19/2016 2:07 PM

## 2016-01-19 NOTE — Progress Notes (Signed)
Future CT chest without contrast ordered for one year follow up on nodule found on 01/19/16 CT scan.   Tenna Delaine, PA-C  [facility] 01/19/2016 6:13 PM

## 2016-01-19 NOTE — Telephone Encounter (Signed)
Spoke to the patient's daughter, Gaspar Bidding, regarding the patient's scheduled CT scan.  The patient does not speak [place], so the information will be relayed to her through [place].  The patient is scheduled for a CT scan of the chest at [facility] on Tuesday, 9/121 at 11:00am.  Arrival time is 10:45am.  Only liquids four hours prior to the appointment.

## 2016-01-19 NOTE — Telephone Encounter (Signed)
The patient's daughter is calling to request the lab results from the patient's office visit on 01/17/16 with Tenna Delaine, PA-C.  Due to the patient's history of cancer, she is anxious about the results. The patient does not speak English, so it has been requested that you please contact the patient's daughter, Gaspar Bidding, at [phone].

## 2016-01-20 ENCOUNTER — Other Ambulatory Visit: Payer: Self-pay | Admitting: *Deleted

## 2016-01-20 ENCOUNTER — Telehealth: Payer: Self-pay | Admitting: Physician Assistant

## 2016-01-20 ENCOUNTER — Encounter: Payer: Self-pay | Admitting: Physician Assistant

## 2016-01-20 ENCOUNTER — Ambulatory Visit: Payer: BLUE CROSS/BLUE SHIELD

## 2016-01-20 ENCOUNTER — Encounter (HOSPITAL_COMMUNITY)
Admission: RE | Admit: 2016-01-20 | Discharge: 2016-01-20 | Disposition: A | Discharge status: Home / Self Care | Payer: BLUE CROSS/BLUE SHIELD | Source: Ambulatory Visit | Attending: Physician Assistant | Admitting: Physician Assistant

## 2016-01-20 ENCOUNTER — Emergency Department (HOSPITAL_COMMUNITY)
Admission: RE | Admit: 2016-01-20 | Discharge: 2016-01-20 | Disposition: A | Discharge status: Home / Self Care | Payer: BLUE CROSS/BLUE SHIELD | Source: Ambulatory Visit | Attending: Physician Assistant | Admitting: Physician Assistant

## 2016-01-20 ENCOUNTER — Emergency Department (HOSPITAL_COMMUNITY)
Admission: EM | Admit: 2016-01-20 | Discharge: 2016-01-21 | Disposition: A | Discharge status: Home / Self Care | Payer: BLUE CROSS/BLUE SHIELD | Attending: Emergency Medicine | Admitting: Emergency Medicine

## 2016-01-20 ENCOUNTER — Encounter (HOSPITAL_COMMUNITY): Payer: Self-pay | Admitting: *Deleted

## 2016-01-20 DIAGNOSIS — R079 Chest pain, unspecified: Secondary | ICD-10-CM | POA: Diagnosis not present

## 2016-01-20 DIAGNOSIS — M542 Cervicalgia: Secondary | ICD-10-CM | POA: Insufficient documentation

## 2016-01-20 DIAGNOSIS — Z87891 Personal history of nicotine dependence: Secondary | ICD-10-CM | POA: Diagnosis not present

## 2016-01-20 DIAGNOSIS — Z5321 Procedure and treatment not carried out due to patient leaving prior to being seen by health care provider: Secondary | ICD-10-CM | POA: Diagnosis not present

## 2016-01-20 DIAGNOSIS — R0781 Pleurodynia: Secondary | ICD-10-CM

## 2016-01-20 LAB — BASIC METABOLIC PANEL
Anion gap: 8 (ref 5–15)
BUN: 11 mg/dL (ref 6–20)
CALCIUM: 8.7 mg/dL — AB (ref 8.9–10.3)
CO2: 25 mmol/L (ref 22–32)
CREATININE: 0.78 mg/dL (ref 0.44–1.00)
Chloride: 104 mmol/L (ref 101–111)
GFR calc non Af Amer: >60 mL/min (ref >60)
Glucose, Bld: 97 mg/dL (ref 65–99)
Potassium: 4.3 mmol/L (ref 3.5–5.1)
SODIUM: 137 mmol/L (ref 135–145)

## 2016-01-20 LAB — CBC
HEMATOCRIT: 39.7 % (ref 36.0–46.0)
Hemoglobin: 12.5 g/dL (ref 12.0–15.0)
MCH: 27.6 pg (ref 26.0–34.0)
MCHC: 31.5 g/dL (ref 30.0–36.0)
MCV: 87.6 fL (ref 78.0–100.0)
Platelets: 227 10*3/uL (ref 150–400)
RBC: 4.53 MIL/uL (ref 3.87–5.11)
RDW: 13.7 % (ref 11.5–15.5)
WBC: 6.8 10*3/uL (ref 4.0–10.5)

## 2016-01-20 LAB — TROPONIN I: Troponin I: <0.03 ng/mL (ref <0.03)

## 2016-01-20 LAB — D-DIMER, QUANTITATIVE (NOT AT ARMC): D DIMER QUANT: 1.78 ug{FEU}/mL — AB (ref <0.50)

## 2016-01-20 MED ORDER — TECHNETIUM TO 99M ALBUMIN AGGREGATED
4.3300 | Freq: Once | INTRAVENOUS | Status: AC | PRN
  Administered 2016-01-20: 4 via INTRAVENOUS

## 2016-01-20 MED ORDER — AMOXICILLIN-POT CLAVULANATE 875-125 MG PO TABS: 1.0000 | ORAL_TABLET | Freq: Two times a day (BID) | ORAL | 0 refills | Status: DC

## 2016-01-20 MED ORDER — TECHNETIUM TC 99M DIETHYLENETRIAME-PENTAACETIC ACID: 33.0000 | Freq: Once | INTRAVENOUS | Status: DC | PRN

## 2016-01-20 NOTE — Progress Notes (Signed)
Discussed with daughter that due to elevated d-dimer and pt's symptoms of right pleuritic chest pain, will order stat VQ scan. Pt's daughter understands and agrees to this plan. Also, discussed CT chest findings of nodules, have ordered a repeat CT chest without contrast in 12 months for follow up.

## 2016-01-20 NOTE — ED Notes (Signed)
Pt called for reassessment, no response 

## 2016-01-20 NOTE — ED Notes (Signed)
Radiology called and states that pt was suppose to be registered as an Outpatient.  She has just completed her VQ scan.  Radiology called PCP that sent pt for outpatient test.  Per radiology, the PCP only wants pt brought back to ED if positive for PE.  Test were negative so they are sending pt home.

## 2016-01-20 NOTE — ED Notes (Signed)
Per charge, pt is in nuclear medicine.

## 2016-01-20 NOTE — Telephone Encounter (Signed)
Her VQ scan was negative - pt was told at radiology.  She is not feeling better and the xray they did tonight showed a possible PNA so I will treat with Augmentin for 10d then she will f/u in 2 weeks for repeat xray to look for resolution - she should focu on deep breathing to reduce chances of atelectasis.  Spoke with daughter regarding these instructions and findings.

## 2016-01-20 NOTE — ED Triage Notes (Signed)
The pt was seen at [facility] yesterday because she was having some neck pain and rt chest pain.  She had a c-t scan there and  Was told to come to this ed today due to the results of last nights c-t scan results   Poss mass

## 2016-01-21 LAB — SPECIMEN STATUS

## 2016-01-23 LAB — BASIC METABOLIC PANEL
BUN / CREAT RATIO: 22 (ref 12–28)
BUN: 18 mg/dL (ref 8–27)
CHLORIDE: 104 mmol/L (ref 96–106)
CO2: 25 mmol/L (ref 18–29)
Calcium: 8.7 mg/dL (ref 8.7–10.3)
Creatinine, Ser: 0.83 mg/dL (ref 0.57–1.00)
GFR calc Af Amer: 86 mL/min/{1.73_m2} (ref >59)
GFR calc non Af Amer: 75 mL/min/{1.73_m2} (ref >59)
GLUCOSE: 95 mg/dL (ref 65–99)
POTASSIUM: 4.8 mmol/L (ref 3.5–5.2)
Sodium: 142 mmol/L (ref 134–144)

## 2016-01-24 ENCOUNTER — Ambulatory Visit (HOSPITAL_COMMUNITY): Payer: BLUE CROSS/BLUE SHIELD

## 2016-02-01 ENCOUNTER — Ambulatory Visit (INDEPENDENT_AMBULATORY_CARE_PROVIDER_SITE_OTHER): Payer: BLUE CROSS/BLUE SHIELD

## 2016-02-01 ENCOUNTER — Encounter: Payer: Self-pay | Admitting: Emergency Medicine

## 2016-02-01 ENCOUNTER — Ambulatory Visit (INDEPENDENT_AMBULATORY_CARE_PROVIDER_SITE_OTHER): Payer: BLUE CROSS/BLUE SHIELD | Admitting: Emergency Medicine

## 2016-02-01 VITALS — BP 122/80 | HR 88 | Temp 98.2°F | Resp 16 | Ht 62.0 in | Wt 167.0 lb

## 2016-02-01 DIAGNOSIS — R0781 Pleurodynia: Secondary | ICD-10-CM | POA: Diagnosis not present

## 2016-02-01 DIAGNOSIS — R059 Cough, unspecified: Secondary | ICD-10-CM

## 2016-02-01 DIAGNOSIS — M159 Polyosteoarthritis, unspecified: Secondary | ICD-10-CM

## 2016-02-01 DIAGNOSIS — J988 Other specified respiratory disorders: Secondary | ICD-10-CM

## 2016-02-01 DIAGNOSIS — M150 Primary generalized (osteo)arthritis: Secondary | ICD-10-CM

## 2016-02-01 DIAGNOSIS — R05 Cough: Secondary | ICD-10-CM | POA: Diagnosis not present

## 2016-02-01 DIAGNOSIS — J22 Unspecified acute lower respiratory infection: Secondary | ICD-10-CM

## 2016-02-01 MED ORDER — AMOXICILLIN-POT CLAVULANATE 875-125 MG PO TABS: 1.0000 | ORAL_TABLET | Freq: Two times a day (BID) | ORAL | 0 refills | Status: DC

## 2016-02-01 NOTE — Patient Instructions (Addendum)
Please return to clinic in 2 weeks for repeat chest x-ray. Your x-rays improved but still shows some abnormalities.    IF you received an x-ray today, you will receive an invoice from [facility]. Please contact [facility] at [phone] with questions or concerns regarding your invoice.   IF you received labwork today, you will receive an invoice from [employer]. Please contact [facility] at [phone] with questions or concerns regarding your invoice.   Our billing staff will not be able to assist you with questions regarding bills from these companies.  You will be contacted with the lab results as soon as they are available. The fastest way to get your results is to activate your My Chart account. Instructions are located on the last page of this paperwork. If you have not heard from [place] regarding the results in 2 weeks, please contact this office.

## 2016-02-01 NOTE — Progress Notes (Addendum)
By signing my name below, I, Shelia Berg, attest that this documentation has been prepared under the direction and in the presence of Shelia Jordan, MD. Electronically Signed: [name], ER Scribe. 02/01/2016. 12:03 PM.  Chief Complaint:  Chief Complaint  Patient presents with  . Follow-up    pneumonia   HPI: Shelia Berg is a 65 y.o. female who reports to [facility] today reporting for a hospital follow up. She had a CXR on 01/20/16 which showed increasing opacification of the right lung base possibly representing worsening associated atelectasis or pneumonia. She had a chest CT on 01/19/16 which showed small pulmonary nodules and atelectasis vs scarring in the right lower lobe which was stable. She also had a VQ scan completed on 01/20/16 which showed low likelihood of a PE. She has a 35 year smoking hx, and quit two years ago.    Past Medical History:  Diagnosis Date  . Bladder cancer ([place]) 6 years ago   Past Surgical History:  Procedure Laterality Date  . ABDOMINAL HYSTERECTOMY     Social History   Social History  . Marital status: Married    Spouse name: N/A  . Number of children: N/A  . Years of education: N/A   Social History Main Topics  . Smoking status: Former Smoker    Packs/day: 0.50    Years: 30.00    Types: Cigarettes    Quit date: 06/05/2014  . Smokeless tobacco: Never Used  . Alcohol use No  . Drug use: No  . Sexual activity: Yes   Other Topics Concern  . None   Social History Narrative  . None   History reviewed. No pertinent family history. Allergies  Allergen Reactions  . Contrast Media [Iodinated Diagnostic Agents] Anaphylaxis    Patient stopped breathing   Prior to Admission medications   Medication Sig Start Date End Date Taking? Authorizing Provider  Ascorbic Acid (VITAMIN C) 100 MG tablet Take 100 mg by mouth daily.   Yes Historical Provider, MD  aspirin 81 MG tablet Take 81 mg by mouth daily.   Yes Historical Provider, MD    Omega-3 Fatty Acids (FISH OIL) 1000 MG CAPS Take by mouth.   Yes Historical Provider, MD  benzonatate (TESSALON) 100 MG capsule Take 1-2 capsules (100-200 mg total) by mouth 3 (three) times daily as needed for cough. 10/06/15   Elby Beck, FNP     ROS: The patient denies fevers, chills, night sweats, unintentional weight loss, palpitations, wheezing, dyspnea on exertion, nausea, vomiting, abdominal pain, dysuria, hematuria, melena, numbness, weakness, or tingling.   All other systems have been reviewed and were otherwise negative with the exception of those mentioned in the HPI and as above.    PHYSICAL EXAM: Vitals:   02/01/16 1144  BP: 122/80  Pulse: 88  Resp: 16  Temp: 98.2 F (36.8 C)   Body mass index is 30.54 kg/m.   General: Alert, no acute distress HEENT:  Normocephalic, atraumatic, oropharynx patent. Eye: [name] Cardiovascular:  Regular rate and rhythm, no rubs murmurs or gallops.  No Carotid bruits, radial pulse intact. No pedal edema.  Respiratory: Clear to auscultation bilaterally.  No wheezes, rales, or rhonchi.  No cyanosis, no use of accessory musculature Abdominal: No organomegaly, abdomen is soft and non-tender, positive bowel sounds.  No masses. Musculoskeletal: Gait intact. Osteoarthritic changes in bilateral hands. Skin: No rashes. Neurologic: Facial musculature symmetric. Psychiatric: Patient acts appropriately throughout our interaction. Lymphatic: No cervical or submandibular lymphadenopathy  LABS: Dg Chest 2 View  Result Date: 02/01/2016 CLINICAL DATA:  Lower respiratory infection. EXAM: CHEST  2 VIEW COMPARISON:  01/20/2016 and 01/17/2016 and 08/03/2015 as well as CT scan dated 01/19/2016. FINDINGS: There is a small residual right pleural effusion, diminished since the prior study. There is peribronchial thickening but there are no consolidative infiltrates. Heart size and pulmonary vascularity are normal. No acute bone abnormality.  Calcification in the arch of the aorta. IMPRESSION: 1. Decreased small right pleural effusion. 2. No acute infiltrates. 3. Bronchitic changes. 4. Aortic atherosclerosis. Electronically Signed   By: [name] M.D.   On: 02/01/2016 12:25   Dg Chest 2 View  Result Date: 01/20/2016 CLINICAL DATA:  65 y/o F; pain in the center of the chest for 2 days. EXAM: CHEST  2 VIEW COMPARISON:  01/17/2016 chest radiograph. FINDINGS: Stable cardiomediastinal silhouette. Stable small right pleural effusion. Increasing right basilar opacification may represent worsening atelectasis or pneumonia. Fluid tracks along the major fissure. No new focal consolidation. No pneumothorax. No acute osseous abnormality is evident. IMPRESSION: Stable small right pleural effusion. Increasing opacification of the right lung base may represent worsening associated atelectasis or pneumonia. Electronically Signed   By: Kristine Garbe M.D.   On: 01/20/2016 19:22   Dg Chest 2 View  Result Date: 01/17/2016 CLINICAL DATA:  Right-sided chest pain for several days, initial encounter EXAM: CHEST  2 VIEW COMPARISON:  08/03/2015 FINDINGS: Cardiac shadow is stable. The lungs are well aerated bilaterally. A small new right pleural effusion is noted. No focal confluent infiltrate is seen. No acute bony abnormality is noted. Chronic compression deformity in the lower thoracic spine is seen. IMPRESSION: New right pleural effusion likely related to the patient's underlying discomfort. Electronically Signed   By: Inez Catalina M.D.   On: 01/17/2016 12:35   Ct Chest Wo Contrast  Result Date: 01/19/2016 CLINICAL DATA:  Pleuritic chest pain. EXAM: CT CHEST WITHOUT CONTRAST TECHNIQUE: Multidetector CT imaging of the chest was performed following the standard protocol without IV contrast. COMPARISON:  CT scan of June 09, 2014. FINDINGS: Cardiovascular: Atherosclerosis of thoracic aorta is noted without aneurysm formation. Mediastinum/Nodes: No  mediastinal mass or adenopathy is noted. Lungs/Pleura: No pneumothorax is noted. Minimal right posterior pleural effusion is noted. Stable 4 mm nodule is noted in left lung apex best seen on image number 14 of series 5. Stable 4 mm nodule is noted laterally in the right upper lobe best seen on image number 54 of series 5. Stable atelectasis or scarring is noted inferiorly in right middle lobe. 5 mm subpleural nodule is noted along right lateral chest wall in right lung base best seen on image number 96 of series 5. This is not clearly visualized on prior exam most likely due to overlying inflammation seen at that time. Upper Abdomen: No definite abnormality seen in visualized portion of upper abdomen. Musculoskeletal: No significant osseous abnormality is noted. IMPRESSION: Aortic atherosclerosis. Minimal right pleural effusion is noted. Stable 4 mm nodules are noted in the right upper lobe and left lung apex compared to prior exam. These can be considered benign at this point with no further follow-up required. Stable atelectasis or scarring is noted in inferior portion of the right middle lobe. 5 mm subpleural nodule seen along right lateral chest wall in right lower lobe. No follow-up needed if patient is low-risk. Non-contrast chest CT can be considered in 12 months if patient is high-risk. This recommendation follows the consensus statement: Guidelines for Management of Incidental  Pulmonary Nodules Detected on CT Images:From the Fleischner Society 2017; published online before print (10.1148/radiol.SG:[id number]). Electronically Signed   By: Marijo Conception, M.D.   On: 01/19/2016 16:19   Nm Pulmonary Perf And Vent  Result Date: 01/20/2016 CLINICAL DATA:  Pleuritic chest pain and elevated D-dimer. EXAM: NUCLEAR MEDICINE VENTILATION - PERFUSION LUNG SCAN TECHNIQUE: Ventilation images were obtained in multiple projections using inhaled aerosol Tc-67m DTPA. Perfusion images were obtained in multiple projections  after intravenous injection of Tc-52m MAA. RADIOPHARMACEUTICALS:  4.1 mCi Technetium-29m DTPA aerosol inhalation and 33.0 mCi Technetium-61m MAA IV COMPARISON:  01/20/2016 chest radiograph FINDINGS: Ventilation: Scattered irregular intersecting peripheral ventilation defects are present especially in the upper lobes but also notable along the lower portions of the lower lobes. Bilateral stripe site appearance on both ventilation and perfusion. Perfusion: Stripe sign noted bilaterally with faintly peripherally reduced profusion along portions of the right upper lobe, but less striking than the defects on the ventilation sequences. IMPRESSION: 1. Very low probability of pulmonary embolus by PIOPED II criteria (0 to 9% risk). Scattered ventilation defects are more striking than the small upper lobe perfusion defects and suggest the possibility of air trapping or airway thickening causing ventilation defects. Stripe sign on both ventilation and perfusion images. These results will be called to the ordering clinician or representative by the Radiologist Assistant, and communication documented in the PACS or zVision Dashboard. Electronically Signed   By: Van Clines M.D.   On: 01/20/2016 20:09     EKG/XRAY:   Primary read interpreted by Dr. Everlene Farrier at [facility].   ASSESSMENT/PLAN: Medication extended for 5 more days. She does have a residual abnormal area right middle lobe. She will return to clinic in 2 weeks for repeat chest x-ray.I personally performed the services described in this documentation, which was scribed in my presence. The recorded information has been reviewed and is accurate.   Gross sideeffects, risk and benefits, and alternatives of medications d/w patient. Patient is aware that all medications have potential sideeffects and we are unable to predict every sideeffect or drug-drug interaction that may occur.  Arlyss Queen MD 02/01/2016 12:03 PM

## 2016-02-13 ENCOUNTER — Ambulatory Visit (INDEPENDENT_AMBULATORY_CARE_PROVIDER_SITE_OTHER): Payer: BLUE CROSS/BLUE SHIELD

## 2016-02-13 ENCOUNTER — Ambulatory Visit (INDEPENDENT_AMBULATORY_CARE_PROVIDER_SITE_OTHER): Payer: BLUE CROSS/BLUE SHIELD | Admitting: Family Medicine

## 2016-02-13 VITALS — HR 85 | Temp 98.0°F | Resp 17 | Ht 62.0 in | Wt 169.0 lb

## 2016-02-13 DIAGNOSIS — Z23 Encounter for immunization: Secondary | ICD-10-CM

## 2016-02-13 DIAGNOSIS — J90 Pleural effusion, not elsewhere classified: Secondary | ICD-10-CM

## 2016-02-13 DIAGNOSIS — H02846 Edema of left eye, unspecified eyelid: Secondary | ICD-10-CM | POA: Diagnosis not present

## 2016-02-13 MED ORDER — ERYTHROMYCIN 5 MG/GM OP OINT: 1.0000 "application " | TOPICAL_OINTMENT | Freq: Three times a day (TID) | OPHTHALMIC | 0 refills | Status: DC

## 2016-02-13 NOTE — Progress Notes (Signed)
By signing my name below, I, Shelia Berg, attest that this documentation has been prepared under the direction and in the presence of Shelia Ray, MD.  Electronically Signed: Verlee Berg, Medical Scribe. 02/13/16. 6:33 PM.  Subjective:    Patient ID: Shelia Berg, female    DOB: April 20, 1951, 65 y.o.   MRN: [id number]  HPI Chief Complaint  Patient presents with  . Follow-up    Respiratory infectoin  . Eye Problem    Left. Noticed this morning.     HPI Comments: Shelia Berg is a 65 y.o. female who presents to the [facility] for PNA follow-up. Seen by Dr. Everlene Farrier 02/01/16, was a PNA follow-up at that time. After CXR in the hospital on 9/8 with rt lung base capacity, chest CT 9/7 small pulmonary nodules with atelectasis vs scaring RLL that was stable. Repeat CXR with Dr. Everlene Farrier on 9/20 showed decreased small rt effusion, no infiltrates. He extended her augmentin for 5 more days. Advised to return for repeat CXR.  Pt reports back pain located between her shoulder. Pt states she feels better. Pt denies coughing, fever, and SOB.  Eye: Pt noticed left eye lid redness this morning and had eyelid itchiness last night. Pt is not wearing a new eye makeup. Pt denies coming in contact with someone with pink eye, and she denies scratching her eye.  Visual Acuity Screening   Right eye Left eye Both eyes  Without correction: 20/25 20/40 20/20   With correction:      Immunizations: Pt would like to get her flu shot.  Patient Active Problem List   Diagnosis Date Noted  . Hyperkalemia 08/04/2015  . Community acquired pneumonia 06/07/2014  . Hypokalemia 06/07/2014  . CAP (community acquired pneumonia) 06/07/2014  . Osteoarthritis 10/03/2006  . FRACTURE, WRIST, LEFT 05/20/2001  . HX, PERSONAL, VENOUS THROMBOSIS/EMBOLISM 05/15/1987  . TOTAL ABDOMINAL HYSTERECTOMY, HX OF 05/14/1986   Past Medical History:  Diagnosis Date  . Bladder cancer ([place]) 6 years ago   Past Surgical  History:  Procedure Laterality Date  . ABDOMINAL HYSTERECTOMY     Allergies  Allergen Reactions  . Contrast Media [Iodinated Diagnostic Agents] Anaphylaxis    Patient stopped breathing   Prior to Admission medications   Medication Sig Start Date End Date Taking? Authorizing Provider  Ascorbic Acid (VITAMIN C) 100 MG tablet Take 100 mg by mouth daily.   Yes Historical Provider, MD  aspirin 81 MG tablet Take 81 mg by mouth daily.   Yes Historical Provider, MD  Omega-3 Fatty Acids (FISH OIL) 1000 MG CAPS Take by mouth.   Yes Historical Provider, MD   Social History   Social History  . Marital status: Married    Spouse name: N/A  . Number of children: N/A  . Years of education: N/A   Occupational History  . Not on file.   Social History Main Topics  . Smoking status: Former Smoker    Packs/day: 0.50    Years: 30.00    Types: Cigarettes    Quit date: 06/05/2014  . Smokeless tobacco: Never Used  . Alcohol use No  . Drug use: No  . Sexual activity: Yes   Other Topics Concern  . Not on file   Social History Narrative  . No narrative on file   Depression screen [facility] 2/9 02/13/2016 02/01/2016 01/17/2016 12/12/2015 10/06/2015  Decreased Interest 0 0 0 0 0  Down, Depressed, Hopeless 0 0 0 0 0  PHQ - 2 Score 0  0 0 0 0   Review of Systems  Constitutional: Negative for fever.  Eyes: Positive for redness (eyelid) and itching (eyelid).  Respiratory: Negative for cough and shortness of breath.   Musculoskeletal: Positive for back pain.   Objective:  Physical Exam  Constitutional: She appears well-developed and well-nourished. No distress.  HENT:  Head: Normocephalic and atraumatic.  Eyes: Conjunctivae are normal.  Minimal erythema of upper lid No scleral injection No discharge at the canthi Small possible internal hordeolum on left upper lid Slight erythema, minimal edema  Neck: Neck supple.  Cardiovascular: Normal rate and regular rhythm.  Exam reveals friction rub. Exam  reveals no gallop.   No murmur heard. Pulmonary/Chest: Effort normal and breath sounds normal. No respiratory distress. She has no wheezes. She has no rales.  Musculoskeletal:  Soreness in the paraspinal muscles on the right side as well as the rt trapezius area Calves non tender  Neurological: She is alert.  Skin: Skin is warm and dry.  Psychiatric: She has a normal mood and affect. Her behavior is normal.  Nursing note and vitals reviewed.  Pulse 85   Temp 98 F (36.7 C) (Oral)   Resp 17   Ht 5\' 2"  (1.575 m)   Wt 169 lb (76.7 kg)   SpO2 98%   BMI 30.91 kg/m  Assessment & Plan:   Shelia Berg is a 65 y.o. female Pleural effusion, right - Plan: DG Chest 2 View  - Persistent pleural effusion, afebrile, no cough, no chest pain. History of pulmonary nodules that appear to be stable on previous CT scan. Will repeat chest x-Berg in one week, if persistent effusion at that time, can discuss with pulmonary  - RTC precautions if any worsening symptoms.  Swelling of eyelid, left - Plan: erythromycin [facility]) ophthalmic ointment  - Early hordeolum versus blepharitis. Minimal symptoms. Actual eye appears to be uninvolved. Warm compresses, avoid eye makeup for the next few days, and if not improving into tomorrow, did prescribe erythromycin ointment. RTC precautions if worse.  Needs flu shot - Plan: Flu Vaccine QUAD 36+ mos IM given.    Meds ordered this encounter  Medications  . erythromycin [facility]) ophthalmic ointment    Sig: Place 1 application into the left eye 3 (three) times daily.    Dispense:  3.5 g    Refill:  0   Patient Instructions    Your left eye appears to be a possible early stye or blepharitis which is inflammation of the eyelid. Avoid eye makeup for the next few days, and if the swelling is not improving into tomorrow, can start antibiotic ointment 3 times per day. If that does not improve your symptoms within the next few days, return for recheck. Sooner if  worse.  Your upper back pain is likely due to muscle pain. Heat, gentle stretches, and recheck if that is not improving within the next week. Your x-Berg did still show a small amount of pleural effusion or fluid in the lungs. This was minimally different from last visit, but still present. Recheck in 1 week to repeat chest x-Berg, and if still present, can look into other workup. Return sooner if any chest pain, shortness of breath, fevers, or any worsening symptoms.   Pleural Effusion A pleural effusion is an abnormal buildup of fluid in the layers of tissue between your lungs and the inside of your chest (pleural space). These two layers of tissue that line both your lungs and the inside of your chest are  called pleura. Usually, there is no air in the space between the pleura, only a thin layer of fluid. If left untreated, a large amount of fluid can build up and cause the lung to collapse. A pleural effusion is usually caused by another disease that requires treatment. The two main types of pleural effusion are:  Transudative pleural effusion. This happens when fluid leaks into the pleural space because of a low protein count in your blood or high blood pressure in your vessels. Heart failure often causes this.  Exudative infusion. This occurs when fluid collects in the pleural space from blocked blood vessels or lymph vessels. Some lung diseases, injuries, and cancers can cause this type of effusion. CAUSES Pleural effusion can be caused by:  Heart failure.  A blood clot in the lung (pulmonary embolism).  Pneumonia.  Cancer.  Liver failure (cirrhosis).  Kidney disease.  Complications from surgery, such as from open heart surgery. SIGNS AND SYMPTOMS In some cases, pleural effusion may cause no symptoms. Symptoms can include:  Shortness of breath, especially when lying down.  Chest pain, often worse when taking a deep breath.  Fever.  Dry cough that is lasting  (chronic).  Hiccups.  Rapid breathing. An underlying condition that is causing the pleural effusion (such as heart failure, pneumonia, blood clots, tuberculosis, or cancer) may also cause additional symptoms. DIAGNOSIS Your health care provider may suspect pleural effusion based on your symptoms and medical history. Your health care provider will also do a physical exam and a chest X-Berg. If the X-Berg shows there is fluid in your chest, you may need to have this fluid removed using a needle (thoracentesis) so it can be tested. You may also have:  Imaging studies of the chest, such as:  Ultrasound.  CT scan.  Blood tests for kidney and liver function. TREATMENT Treatment depends on the cause of the pleural effusion. Treatment may include:  Taking antibiotic medicines to clear up an infection that is causing the pleural effusion.  Placing a tube in the chest to drain the effusion (tube thoracostomy). This procedure is often used when there is an infection in the fluid.  Surgery to remove the fibrous outer layer of tissue from the pleural space (decortication).  Thoracentesis, which can improve cough and shortness of breath.  A procedure to put medicine into the chest cavity to seal the pleural space to prevent fluid buildup (pleurodesis).  Chemotherapy and radiation therapy. These may be required in the case of cancerous (malignant) pleural effusion. HOME CARE INSTRUCTIONS  Take medicines only as directed by your health care provider.  Keep track of how long you can gently exercise before you get short of breath. Try simply walking at first.  Do not use any tobacco products, including cigarettes, chewing tobacco, or electronic cigarettes. If you need help quitting, ask your health care provider.  Keep all follow-up visits as directed by your health care provider. This is important. SEEK MEDICAL CARE IF:  The amount of time that you are able to exercise decreases or does not  improve with time.  You have pain or signs of infection at the puncture site if you had thoracentesis. Watch for:  Drainage.  Redness.  Swelling.  You have a fever. SEEK IMMEDIATE MEDICAL CARE IF:  You are short of breath.  You develop chest pain.  You develop a new cough. MAKE SURE YOU:  Understand these instructions.  Will watch your condition.  Will get help right away if you are  not doing well or get worse.   This information is not intended to replace advice given to you by your health care provider. Make sure you discuss any questions you have with your health care provider.   Document Released: 04/30/2005 Document Revised: 05/21/2014 Document Reviewed: 09/23/2013 Elsevier Interactive Patient Education 2016 [employer].     IF you received an x-Berg today, you will receive an invoice from [facility]. Please contact [facility] at [phone] with questions or concerns regarding your invoice.   IF you received labwork today, you will receive an invoice from [employer]. Please contact [facility] at [phone] with questions or concerns regarding your invoice.   Our billing staff will not be able to assist you with questions regarding bills from these companies.  You will be contacted with the lab results as soon as they are available. The fastest way to get your results is to activate your My Chart account. Instructions are located on the last page of this paperwork. If you have not heard from [place] regarding the results in 2 weeks, please contact this office.        I personally performed the services described in this documentation, which was scribed in my presence. The recorded information has been reviewed and considered, and addended by me as needed.   Signed,   Shelia Ray, MD [facility].  02/13/16 7:21 PM

## 2016-02-13 NOTE — Patient Instructions (Addendum)
Your left eye appears to be a possible early stye or blepharitis which is inflammation of the eyelid. Avoid eye makeup for the next few days, and if the swelling is not improving into tomorrow, can start antibiotic ointment 3 times per day. If that does not improve your symptoms within the next few days, return for recheck. Sooner if worse.  Your upper back pain is likely due to muscle pain. Heat, gentle stretches, and recheck if that is not improving within the next week. Your x-ray did still show a small amount of pleural effusion or fluid in the lungs. This was minimally different from last visit, but still present. Recheck in 1 week to repeat chest x-ray, and if still present, can look into other workup. Return sooner if any chest pain, shortness of breath, fevers, or any worsening symptoms.   Pleural Effusion A pleural effusion is an abnormal buildup of fluid in the layers of tissue between your lungs and the inside of your chest (pleural space). These two layers of tissue that line both your lungs and the inside of your chest are called pleura. Usually, there is no air in the space between the pleura, only a thin layer of fluid. If left untreated, a large amount of fluid can build up and cause the lung to collapse. A pleural effusion is usually caused by another disease that requires treatment. The two main types of pleural effusion are:  Transudative pleural effusion. This happens when fluid leaks into the pleural space because of a low protein count in your blood or high blood pressure in your vessels. Heart failure often causes this.  Exudative infusion. This occurs when fluid collects in the pleural space from blocked blood vessels or lymph vessels. Some lung diseases, injuries, and cancers can cause this type of effusion. CAUSES Pleural effusion can be caused by:  Heart failure.  A blood clot in the lung (pulmonary embolism).  Pneumonia.  Cancer.  Liver failure  (cirrhosis).  Kidney disease.  Complications from surgery, such as from open heart surgery. SIGNS AND SYMPTOMS In some cases, pleural effusion may cause no symptoms. Symptoms can include:  Shortness of breath, especially when lying down.  Chest pain, often worse when taking a deep breath.  Fever.  Dry cough that is lasting (chronic).  Hiccups.  Rapid breathing. An underlying condition that is causing the pleural effusion (such as heart failure, pneumonia, blood clots, tuberculosis, or cancer) may also cause additional symptoms. DIAGNOSIS Your health care provider may suspect pleural effusion based on your symptoms and medical history. Your health care provider will also do a physical exam and a chest X-ray. If the X-ray shows there is fluid in your chest, you may need to have this fluid removed using a needle (thoracentesis) so it can be tested. You may also have:  Imaging studies of the chest, such as:  Ultrasound.  CT scan.  Blood tests for kidney and liver function. TREATMENT Treatment depends on the cause of the pleural effusion. Treatment may include:  Taking antibiotic medicines to clear up an infection that is causing the pleural effusion.  Placing a tube in the chest to drain the effusion (tube thoracostomy). This procedure is often used when there is an infection in the fluid.  Surgery to remove the fibrous outer layer of tissue from the pleural space (decortication).  Thoracentesis, which can improve cough and shortness of breath.  A procedure to put medicine into the chest cavity to seal the pleural space to prevent  fluid buildup (pleurodesis).  Chemotherapy and radiation therapy. These may be required in the case of cancerous (malignant) pleural effusion. HOME CARE INSTRUCTIONS  Take medicines only as directed by your health care provider.  Keep track of how long you can gently exercise before you get short of breath. Try simply walking at first.  Do not  use any tobacco products, including cigarettes, chewing tobacco, or electronic cigarettes. If you need help quitting, ask your health care provider.  Keep all follow-up visits as directed by your health care provider. This is important. SEEK MEDICAL CARE IF:  The amount of time that you are able to exercise decreases or does not improve with time.  You have pain or signs of infection at the puncture site if you had thoracentesis. Watch for:  Drainage.  Redness.  Swelling.  You have a fever. SEEK IMMEDIATE MEDICAL CARE IF:  You are short of breath.  You develop chest pain.  You develop a new cough. MAKE SURE YOU:  Understand these instructions.  Will watch your condition.  Will get help right away if you are not doing well or get worse.   This information is not intended to replace advice given to you by your health care provider. Make sure you discuss any questions you have with your health care provider.   Document Released: 04/30/2005 Document Revised: 05/21/2014 Document Reviewed: 09/23/2013 Elsevier Interactive Patient Education 2016 [employer].     IF you received an x-ray today, you will receive an invoice from [facility]. Please contact [facility] at [phone] with questions or concerns regarding your invoice.   IF you received labwork today, you will receive an invoice from [employer]. Please contact [facility] at [phone] with questions or concerns regarding your invoice.   Our billing staff will not be able to assist you with questions regarding bills from these companies.  You will be contacted with the lab results as soon as they are available. The fastest way to get your results is to activate your My Chart account. Instructions are located on the last page of this paperwork. If you have not heard from [place] regarding the results in 2 weeks, please contact this office.

## 2016-02-20 ENCOUNTER — Ambulatory Visit (INDEPENDENT_AMBULATORY_CARE_PROVIDER_SITE_OTHER): Payer: BLUE CROSS/BLUE SHIELD

## 2016-02-20 ENCOUNTER — Ambulatory Visit (INDEPENDENT_AMBULATORY_CARE_PROVIDER_SITE_OTHER): Payer: BLUE CROSS/BLUE SHIELD | Admitting: Urgent Care

## 2016-02-20 VITALS — BP 124/70 | HR 87 | Temp 98.1°F | Resp 18 | Ht 62.0 in | Wt 164.0 lb

## 2016-02-20 DIAGNOSIS — R918 Other nonspecific abnormal finding of lung field: Secondary | ICD-10-CM

## 2016-02-20 DIAGNOSIS — R911 Solitary pulmonary nodule: Secondary | ICD-10-CM | POA: Diagnosis not present

## 2016-02-20 DIAGNOSIS — J90 Pleural effusion, not elsewhere classified: Secondary | ICD-10-CM | POA: Diagnosis not present

## 2016-02-20 NOTE — Progress Notes (Signed)
    MRN: [id number] DOB: [birth year]  Subjective:   Shelia Berg is a 65 y.o. female presenting for follow up on pleural effusion, pulmonary nodule, lower respiratory infection. Patient's last visit was on 02/13/2016, she was recommended to come in today for repeat chest x-ray. Today, she reports that she feels very good, denies fever, cough, chest pain, shob, back pain. She has completed her antibiotic course. Of note, patient has 35 pack year smoking history, quit smoking 2 years ago. Her last chest x-ray 02/13/2016 showed trace right effusion which was minimally larger compared to last chest x-ray on 02/01/2016. Also, last chest CT 01/19/2016, noted stable 51mm nodule in right and left upper lung fields. There was also 36mm subpleural nodule seen on right lateral chest wall in right lower lobe with recommendation to repeat non-contrast chest CT in 12 months.   Shelia Berg has a current medication list which includes the following prescription(s): vitamin c, aspirin, erythromycin, and fish oil. Also is allergic to contrast media [iodinated diagnostic agents].  Shelia Berg  has a past medical history of Bladder cancer ([place]) (6 years ago). Also  has a past surgical history that includes Abdominal hysterectomy.  Objective:   Vitals: BP 124/70 (BP Location: Right Arm, Patient Position: Sitting, Cuff Size: Small)   Pulse 87   Temp 98.1 F (36.7 C) (Oral)   Resp 18   Ht 5\' 2"  (1.575 m)   Wt 164 lb (74.4 kg)   SpO2 96%   BMI 30.00 kg/m   Physical Exam  Constitutional: She is oriented to person, place, and time. She appears well-developed and well-nourished.  Cardiovascular: Normal rate, regular rhythm and intact distal pulses.  Exam reveals no gallop and no friction rub.   No murmur heard. Pulmonary/Chest: No respiratory distress. She has no wheezes. She has no rales.  Neurological: She is alert and oriented to person, place, and time.  Skin: Skin is warm and dry.   Dg Chest 2 View  Result Date:  02/20/2016 CLINICAL DATA:  Follow up on pleural effusion, pulmonary nodule, lower respiratory infection. Patient's last visit was on 02/13/2016, she was recommended to come in today for repeat chest x-ray. EXAM: CHEST  2 VIEW COMPARISON:  02/13/2016 FINDINGS: Cardiac silhouette is normal in size and configuration. No mediastinal or hilar masses or evidence of adenopathy. Lungs are mildly hyperexpanded. There is opacity at the right lung base in part due to fluid within the inferior right oblique fissure. Small pleural effusion is noted posteriorly on the right. There also reticular type opacities at the lung bases consistent with atelectasis/scarring. No evidence of pneumonia or pulmonary edema.  No pneumothorax. Skeletal structures are demineralized. There is a minor compression deformity at the thoracolumbar junction, stable. IMPRESSION: 1. No acute cardiopulmonary disease. 2. Small right pleural effusion. Mild lung base scarring. No significant change from the prior study. Electronically Signed   By: Lajean Manes M.D.   On: 02/20/2016 12:42     Assessment and Plan :   This case was precepted with Dr. Carlota Raspberry.   1. Pleural effusion, right 2. Pulmonary nodules/lesions, multiple - Patient is clinically improved but effusion persists. Discussed case with Dr. Carlota Raspberry and we agreed to send her to pulmonology to rule out more insidious process given patient's smoking history. Referral is pending. Patient is in agreement.  Jaynee Eagles, PA-C [facility] [phone] 02/20/2016 12:09 PM

## 2016-02-20 NOTE — Patient Instructions (Addendum)
Pleural Effusion °A pleural effusion is an abnormal buildup of fluid in the layers of tissue between your lungs and the inside of your chest (pleural space). These two layers of tissue that line both your lungs and the inside of your chest are called pleura. Usually, there is no air in the space between the pleura, only a thin layer of fluid. If left untreated, a large amount of fluid can build up and cause the lung to collapse. A pleural effusion is usually caused by another disease that requires treatment. °The two main types of pleural effusion are: °· Transudative pleural effusion. This happens when fluid leaks into the pleural space because of a low protein count in your blood or high blood pressure in your vessels. Heart failure often causes this. °· Exudative infusion. This occurs when fluid collects in the pleural space from blocked blood vessels or lymph vessels. Some lung diseases, injuries, and cancers can cause this type of effusion. °CAUSES °Pleural effusion can be caused by: °· Heart failure. °· A blood clot in the lung (pulmonary embolism). °· Pneumonia. °· Cancer. °· Liver failure (cirrhosis). °· Kidney disease. °· Complications from surgery, such as from open heart surgery. °SIGNS AND SYMPTOMS °In some cases, pleural effusion may cause no symptoms. Symptoms can include: °· Shortness of breath, especially when lying down. °· Chest pain, often worse when taking a deep breath. °· Fever. °· Dry cough that is lasting (chronic). °· Hiccups. °· Rapid breathing. °An underlying condition that is causing the pleural effusion (such as heart failure, pneumonia, blood clots, tuberculosis, or cancer) may also cause additional symptoms. °DIAGNOSIS °Your health care provider may suspect pleural effusion based on your symptoms and medical history. Your health care provider will also do a physical exam and a chest X-ray. If the X-ray shows there is fluid in your chest, you may need to have this fluid removed using a  needle (thoracentesis) so it can be tested. °You may also have: °· Imaging studies of the chest, such as: °¨ Ultrasound. °¨ CT scan. °· Blood tests for kidney and liver function. °TREATMENT °Treatment depends on the cause of the pleural effusion. Treatment may include: °· Taking antibiotic medicines to clear up an infection that is causing the pleural effusion. °· Placing a tube in the chest to drain the effusion (tube thoracostomy). This procedure is often used when there is an infection in the fluid. °· Surgery to remove the fibrous outer layer of tissue from the pleural space (decortication). °· Thoracentesis, which can improve cough and shortness of breath. °· A procedure to put medicine into the chest cavity to seal the pleural space to prevent fluid buildup (pleurodesis). °· Chemotherapy and radiation therapy. These may be required in the case of cancerous (malignant) pleural effusion. °HOME CARE INSTRUCTIONS °· Take medicines only as directed by your health care provider. °· Keep track of how long you can gently exercise before you get short of breath. Try simply walking at first. °· Do not use any tobacco products, including cigarettes, chewing tobacco, or electronic cigarettes. If you need help quitting, ask your health care provider. °· Keep all follow-up visits as directed by your health care provider. This is important. °SEEK MEDICAL CARE IF: °· The amount of time that you are able to exercise decreases or does not improve with time. °· You have pain or signs of infection at the puncture site if you had thoracentesis. Watch for: °¨ Drainage. °¨ Redness. °¨ Swelling. °· You have a fever. °  SEEK IMMEDIATE MEDICAL CARE IF:  You are short of breath.  You develop chest pain.  You develop a new cough. MAKE SURE YOU:  Understand these instructions.  Will watch your condition.  Will get help right away if you are not doing well or get worse.   This information is not intended to replace advice  given to you by your health care provider. Make sure you discuss any questions you have with your health care provider.   Document Released: 04/30/2005 Document Revised: 05/21/2014 Document Reviewed: 09/23/2013 Elsevier Interactive Patient Education 2016 [employer].    IF you received an x-ray today, you will receive an invoice from [facility]. Please contact [facility] at [phone] with questions or concerns regarding your invoice.   IF you received labwork today, you will receive an invoice from [employer]. Please contact [facility] at [phone] with questions or concerns regarding your invoice.   Our billing staff will not be able to assist you with questions regarding bills from these companies.  You will be contacted with the lab results as soon as they are available. The fastest way to get your results is to activate your My Chart account. Instructions are located on the last page of this paperwork. If you have not heard from [place] regarding the results in 2 weeks, please contact this office.    We recommend that you schedule a mammogram for breast cancer screening. Typically, you do not need a referral to do this. Please contact a local imaging center to schedule your mammogram.  [facility] - [phone]  *ask for the Radiology Department [facility] - [phone] or [phone]  [facility] - [phone] [place] [phone] [facility] - [phone]  *ask for the [facility] - [phone]  *ask for the Radiology Department [facility] - [phone]  *ask for the [place] - [phone]

## 2016-03-05 ENCOUNTER — Ambulatory Visit (INDEPENDENT_AMBULATORY_CARE_PROVIDER_SITE_OTHER): Payer: BLUE CROSS/BLUE SHIELD | Admitting: Emergency Medicine

## 2016-03-05 ENCOUNTER — Other Ambulatory Visit: Payer: BLUE CROSS/BLUE SHIELD

## 2016-03-05 ENCOUNTER — Encounter: Payer: Self-pay | Admitting: Emergency Medicine

## 2016-03-05 DIAGNOSIS — R0781 Pleurodynia: Secondary | ICD-10-CM | POA: Diagnosis not present

## 2016-03-05 DIAGNOSIS — F17201 Nicotine dependence, unspecified, in remission: Secondary | ICD-10-CM | POA: Insufficient documentation

## 2016-03-05 DIAGNOSIS — J90 Pleural effusion, not elsewhere classified: Secondary | ICD-10-CM | POA: Insufficient documentation

## 2016-03-05 MED ORDER — PREDNISONE 10 MG PO TABS: ORAL_TABLET | ORAL | 0 refills | Status: DC

## 2016-03-05 NOTE — Assessment & Plan Note (Addendum)
She describes the pain as paretic in nature, associated with cough, associated with position. It is now possible to palpate and reproduce her pain which suggests that there may also be a muscular skeletal component.  VQ scan was negative for pulmonary embolism. No clinical pneumonia although she was empirically treated. Was no trauma. This could be simple pleurisy related to viral process but I believe we need to evaluate for autoimmune disease as outlined above. Empiric prednisone taper after her blood work has been done to see if this resolves.

## 2016-03-05 NOTE — Addendum Note (Signed)
Addended by: Desmond Dike C on: 03/05/2016 12:05 PM   Modules accepted: Orders

## 2016-03-05 NOTE — Progress Notes (Signed)
Subjective:    Patient ID: Shelia Berg, female    DOB: October 31, 1950, 65 y.o.   MRN: [id number]  HPI  65 year old former smoker (35 pk-yrs) with a history of OA, bladder CA (treated 2010). She was admitted with a RLL PNA in 05/2014. At that time had a CT Chest that showed some pulmonary nodules. She was seen 01/17/16 for right-sided pleuritic chest pain and for 4 days. Her evaluation included a chest x-ray that showed a small right pleural effusion. Large slightly on a subsequent chest x-ray from 01/19/16. CT scan of the chest from 01/19/16 was personally reviewed by me. This showed stable 5 mm right lateral chest wall pulmonary nodule, stable 4 mm right upper lobe and left lung apex nodules. Stable atelectasis in the right middle lobe. All right pleural effusion. She had an elevated d-dimer and a ventilation/perfusion scan was performed that showed low probability for pulmonary embolism (01/20/16). He was treated empirically for a possible community acquired pneumonia. A follow-up chest x-ray performed 02/13/16 and then 02/20/16 showed persistent effusion. Her chest pain at the time had persisted. She is still having focal R pleuritic pain that is worse with movement, cough.   Review of Systems  Constitutional: Negative for fever and unexpected weight change.  HENT: Negative for congestion, dental problem, ear pain, nosebleeds, postnasal drip, rhinorrhea, sinus pressure, sneezing, sore throat and trouble swallowing.   Eyes: Negative for redness and itching.  Respiratory: Negative for cough, chest tightness, shortness of breath and wheezing.   Cardiovascular: Positive for chest pain. Negative for palpitations and leg swelling.  Gastrointestinal: Negative for nausea and vomiting.  Genitourinary: Negative for dysuria.  Musculoskeletal: Negative for joint swelling.  Skin: Negative for rash.  Neurological: Negative for headaches.  Hematological: Does not bruise/bleed easily.  Psychiatric/Behavioral: Negative for  dysphoric mood. The patient is not nervous/anxious.     Past Medical History:  Diagnosis Date  . Bladder cancer ([place]) 6 years ago     No family history on file.   Social History   Social History  . Marital status: Married    Spouse name: N/A  . Number of children: N/A  . Years of education: N/A   Occupational History  . Not on file.   Social History Main Topics  . Smoking status: Former Smoker    Packs/day: 0.50    Years: 30.00    Types: Cigarettes    Quit date: 06/05/2014  . Smokeless tobacco: Never Used  . Alcohol use No  . Drug use: No  . Sexual activity: Yes   Other Topics Concern  . Not on file   Social History Narrative  . 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   Allergies  Allergen Reactions  . Contrast Media [Iodinated Diagnostic Agents] Anaphylaxis    Patient stopped breathing     Outpatient Medications Prior to Visit  Medication Sig Dispense Refill  . Ascorbic Acid (VITAMIN C) 100 MG tablet Take 100 mg by mouth daily.    [name] aspirin 81 MG tablet Take 81 mg by mouth daily.    [name] erythromycin [facility]) ophthalmic ointment Place 1 application into the left eye 3 (three) times daily. 3.5 g 0  . Omega-3 Fatty Acids (FISH OIL) 1000 MG CAPS Take by mouth.     No facility-administered medications prior to visit.         Objective:   Physical Exam Vitals:   03/05/16 1127 03/05/16 1129  BP:  110/66  Pulse:  100  SpO2:  94%  Weight: 162 lb (73.5 kg)   Height: 5\' 3"  (1.6 m)    Gen: Pleasant, overwt woman, in no distress,  normal affect  ENT: No lesions,  mouth clear,  oropharynx clear, no postnasal drip  Neck: No JVD, no TMG, no carotid bruits  Lungs: No use of accessory muscles, decrease at R base, clear without rales or rhonchi  Cardiovascular: RRR, heart sounds normal, no murmur or gallops, no peripheral edema  Musculoskeletal: No deformities, tender to palpation of the R costal margin.   Neuro: alert, non focal  Skin: Warm,  no lesions or rashes     Assessment & Plan:  Pleural effusion Etiology unclear. She denies any trauma. Her CT scan of the chest does not show any significant associated parenchymal disease. She does not have a clinical syndrome consistent with a community acquired pneumonia although she was treated for this at the time of presentation. The pleural fluid has not resolved, is persistent on most recent chest x-rays, unfortunately there does not appear to be enough fluid for a thoracentesis at this time. I would like to draw autoimmune labs included a rheumatoid factor, ANA, then treat her empirically with a steroid taper for possible simple pleurisy. If the pleural fluid and pain resolve then no subsequent workup may be necessary. We will repeat her CT scan of the chest in mid November to see if the pleural fluid persists and to further evaluate the underlying parenchyma. If there is enough fluid to tap at that time then we will arrange for thoracentesis.  Pleuritic chest pain She describes the pain as paretic in nature, associated with cough, associated with position. It is now possible to palpate and reproduce her pain which suggests that there may also be a muscular skeletal component.  VQ scan was negative for pulmonary embolism. No clinical pneumonia although she was empirically treated. Was no trauma. This could be simple pleurisy related to viral process but I believe we need to evaluate for autoimmune disease as outlined above. Empiric prednisone taper after her blood work has been done to see if this resolves.  Tobacco dependence in remission She will need pulmonary function testing to evaluate for obstructive disease at some point. I'll defer this until after we further evaluate the pleural effusion.  Baltazar Apo, MD, PhD 03/05/2016, 12:03 PM [facility] [phone] or if no answer [phone]

## 2016-03-05 NOTE — Patient Instructions (Addendum)
We will draw some blood work today  Take prednisone as directed until completely gone.  We will repeat your CT scan in November 2017 without contrast.  Follow with Dr Lamonte Sakai in November to review the testing.

## 2016-03-05 NOTE — Assessment & Plan Note (Signed)
Etiology unclear. She denies any trauma. Her CT scan of the chest does not show any significant associated parenchymal disease. She does not have a clinical syndrome consistent with a community acquired pneumonia although she was treated for this at the time of presentation. The pleural fluid has not resolved, is persistent on most recent chest x-rays, unfortunately there does not appear to be enough fluid for a thoracentesis at this time. I would like to draw autoimmune labs included a rheumatoid factor, ANA, then treat her empirically with a steroid taper for possible simple pleurisy. If the pleural fluid and pain resolve then no subsequent workup may be necessary. We will repeat her CT scan of the chest in mid November to see if the pleural fluid persists and to further evaluate the underlying parenchyma. If there is enough fluid to tap at that time then we will arrange for thoracentesis.

## 2016-03-05 NOTE — Addendum Note (Signed)
Addended by: Desmond Dike C on: 03/05/2016 12:07 PM   Modules accepted: Orders

## 2016-03-05 NOTE — Assessment & Plan Note (Signed)
She will need pulmonary function testing to evaluate for obstructive disease at some point. I'll defer this until after we further evaluate the pleural effusion.

## 2016-03-06 LAB — ANA: Anti Nuclear Antibody(ANA): POSITIVE — AB

## 2016-03-06 LAB — RHEUMATOID FACTOR: Rhuematoid fact SerPl-aCnc: <14 IU/mL (ref <14)

## 2016-03-06 LAB — ANTI-NUCLEAR AB-TITER (ANA TITER)

## 2016-03-29 ENCOUNTER — Ambulatory Visit (INDEPENDENT_AMBULATORY_CARE_PROVIDER_SITE_OTHER)
Admission: RE | Admit: 2016-03-29 | Discharge: 2016-03-29 | Disposition: A | Discharge status: Home / Self Care | Payer: BLUE CROSS/BLUE SHIELD | Source: Ambulatory Visit | Attending: Emergency Medicine | Admitting: Emergency Medicine

## 2016-03-29 ENCOUNTER — Ambulatory Visit (INDEPENDENT_AMBULATORY_CARE_PROVIDER_SITE_OTHER): Payer: BLUE CROSS/BLUE SHIELD | Admitting: Physician Assistant

## 2016-03-29 VITALS — BP 120/72 | HR 106 | Temp 98.4°F | Resp 18 | Ht 62.0 in | Wt 161.0 lb

## 2016-03-29 DIAGNOSIS — R05 Cough: Secondary | ICD-10-CM

## 2016-03-29 DIAGNOSIS — M25562 Pain in left knee: Secondary | ICD-10-CM

## 2016-03-29 DIAGNOSIS — M25561 Pain in right knee: Secondary | ICD-10-CM

## 2016-03-29 DIAGNOSIS — G8929 Other chronic pain: Secondary | ICD-10-CM | POA: Diagnosis not present

## 2016-03-29 DIAGNOSIS — R490 Dysphonia: Secondary | ICD-10-CM | POA: Diagnosis not present

## 2016-03-29 DIAGNOSIS — J90 Pleural effusion, not elsewhere classified: Secondary | ICD-10-CM | POA: Diagnosis not present

## 2016-03-29 DIAGNOSIS — R059 Cough, unspecified: Secondary | ICD-10-CM

## 2016-03-29 LAB — POCT CBC
Granulocyte percent: 74.6 %G (ref 37–80)
HCT, POC: 37.1 % — AB (ref 37.7–47.9)
Hemoglobin: 12.7 g/dL (ref 12.2–16.2)
Lymph, poc: 1.8 (ref 0.6–3.4)
MCH, POC: 28 pg (ref 27–31.2)
MCHC: 34.3 g/dL (ref 31.8–35.4)
MCV: 81.4 fL (ref 80–97)
MID (cbc): 0.6 (ref 0–0.9)
MPV: 7.7 fL (ref 0–99.8)
POC Granulocyte: 7.1 — AB (ref 2–6.9)
POC LYMPH PERCENT: 19.1 % (ref 10–50)
POC MID %: 6.3 % (ref 0–12)
Platelet Count, POC: 258 10*3/uL (ref 142–424)
RBC: 4.56 M/uL (ref 4.04–5.48)
RDW, POC: 13.9 %
WBC: 9.5 10*3/uL (ref 4.6–10.2)

## 2016-03-29 MED ORDER — CAPSAICIN 0.075 % EX CREA: 1.0000 "application " | TOPICAL_CREAM | Freq: Two times a day (BID) | CUTANEOUS | 0 refills | Status: DC

## 2016-03-29 MED ORDER — HYDROCODONE-HOMATROPINE 5-1.5 MG/5ML PO SYRP: 5.0000 mL | ORAL_SOLUTION | Freq: Three times a day (TID) | ORAL | 0 refills | Status: DC | PRN

## 2016-03-29 MED ORDER — BENZONATATE 100 MG PO CAPS: 100.0000 mg | ORAL_CAPSULE | Freq: Three times a day (TID) | ORAL | 0 refills | Status: DC | PRN

## 2016-03-29 MED ORDER — ESOMEPRAZOLE MAGNESIUM 40 MG PO CPDR: 40.0000 mg | DELAYED_RELEASE_CAPSULE | Freq: Every day | ORAL | 2 refills | Status: DC

## 2016-03-29 NOTE — Patient Instructions (Addendum)
Today's blood work does not show any sign of infection. Will treat you symptomatically today. Please drink plenty of fluids to stay well hydrated. Tessalon will help with your cough.  Please take Nexium as prescribed to help with your cough. If this is not improving in 7-10 days, please come back.  Please follow-up with pulmonology today and on the 29th.     Laryngitis Introduction Laryngitis is inflammation of your vocal cords. This causes hoarseness, coughing, loss of voice, sore throat, or a dry throat. Your vocal cords are two bands of muscles that are found in your throat. When you speak, these cords come together and vibrate. These vibrations come out through your mouth as sound. When your vocal cords are inflamed, your voice sounds different. Laryngitis can be temporary (acute) or long-term (chronic). Most cases of acute laryngitis improve with time. Chronic laryngitis is laryngitis that lasts for more than three weeks. What are the causes? Acute laryngitis may be caused by:  A viral infection.  Lots of talking, yelling, or singing. This is also called vocal strain.  Bacterial infections. Chronic laryngitis may be caused by:  Vocal strain.  Injury to your vocal cords.  Acid reflux (gastroesophageal reflux disease or GERD).  Allergies.  Sinus infection.  Smoking.  Alcohol abuse.  Breathing in chemicals or dust.  Growths on the vocal cords. What increases the risk? Risk factors for laryngitis include:  Smoking.  Alcohol abuse.  Having allergies. What are the signs or symptoms? Symptoms of laryngitis may include:  Low, hoarse voice.  Loss of voice.  Dry cough.  Sore throat.  Stuffy nose. How is this diagnosed? Laryngitis may be diagnosed by:  Physical exam.  Throat culture.  Blood test.  Laryngoscopy. This procedure allows your health care provider to look at your vocal cords with a mirror or viewing tube. How is this treated? Treatment for  laryngitis depends on what is causing it. Usually, treatment involves resting your voice and using medicines to soothe your throat. However, if your laryngitis is caused by a bacterial infection, you may need to take antibiotic medicine. If your laryngitis is caused by a growth, you may need to have a procedure to remove it. Follow these instructions at home:  Drink enough fluid to keep your urine clear or pale yellow.  Breathe in moist air. Use a humidifier if you live in a dry climate.  Take medicines only as directed by your health care provider.  If you were prescribed an antibiotic medicine, finish it all even if you start to feel better.  Do not smoke cigarettes or electronic cigarettes. If you need help quitting, ask your health care provider.  Talk as little as possible. Also avoid whispering, which can cause vocal strain.  Write instead of talking. Do this until your voice is back to normal. Contact a health care provider if:  You have a fever.  You have increasing pain.  You have difficulty swallowing. Get help right away if:  You cough up blood.  You have trouble breathing. This information is not intended to replace advice given to you by your health care provider. Make sure you discuss any questions you have with your health care provider. Document Released: 04/30/2005 Document Revised: 10/06/2015 Document Reviewed: 10/13/2013  2017 Elsevier    IF you received an x-ray today, you will receive an invoice from [facility]. Please contact [facility] at [phone] with questions or concerns regarding your invoice.   IF you received labwork today, you  will receive an invoice from [employer]. Please contact [facility] at [phone] with questions or concerns regarding your invoice.   Our billing staff will not be able to assist you with questions regarding bills from these companies.  You will be contacted with the lab  results as soon as they are available. The fastest way to get your results is to activate your My Chart account. Instructions are located on the last page of this paperwork. If you have not heard from [place] regarding the results in 2 weeks, please contact this office.

## 2016-03-29 NOTE — Progress Notes (Signed)
Shelia Berg  MRN: [id number] DOB: 05/21/1950  PCP: Wendie Agreste, MD  Subjective:  Pt is a 65 year old female, history of tobacco abuse, pleural effusion, OA, who presents to clinic for sore throat.   Sore throat, cough and Hoarseness x six days. Cough is dry and is worse at night, especially when she lays down flat. Sleeping with four pillows x four days.  Mucinex not helping. Humidifier in her room at night with vix vapo rub, helping some.  Denies fever, chills, headache, SOB, chest tightness, pleuritic pain, chest pain, bad taste in mouth, burning pain behind sternum.   History of pleural effusion - She was treated for PNU two months ago. Chest CT 01/19/2016 showed small pulmonary nodules and atelectasis RLL. VQ scan 01/20/2016 showed possible PE  CXR 09/20 showed decreased small rt effusion, no infiltrated. She has appointment for a CT of her chest this afternoon and does not want an x-ray at this time.  F/u appt with pulmonologist Nov 29.   Her appt with pulmonology on 10/23 for f/u pleural effusion: Not enough fluid for thoracentesis at that time. Was treated with steroid taper, labs drawn: ANA, Rf. She has a CT scan at 4 o'clock this afternoon to further evaluate underlying parenchyma.   35 pack year smoking history, quit two years ago.   B/l knee pain x several years - She has an appointment with rheumatologist in December. She would like something for pain until she gets to her appointment.    Review of Systems  Constitutional: Negative for chills, diaphoresis, fatigue and fever.  HENT: Positive for sore throat and voice change. Negative for congestion, postnasal drip, rhinorrhea, sinus pressure and sneezing.   Respiratory: Positive for cough. Negative for chest tightness, shortness of breath and wheezing.   Cardiovascular: Negative for chest pain and palpitations.  Gastrointestinal: Negative for abdominal pain, diarrhea, nausea and vomiting.  Neurological: Negative for  weakness, light-headedness and headaches.  Psychiatric/Behavioral: Positive for sleep disturbance.    Patient Active Problem List   Diagnosis Date Noted  . Pleuritic chest pain 03/05/2016  . Pleural effusion 03/05/2016  . Tobacco dependence in remission 03/05/2016  . Hyperkalemia 08/04/2015  . Hypokalemia 06/07/2014  . Osteoarthritis 10/03/2006  . FRACTURE, WRIST, LEFT 05/20/2001  . HX, PERSONAL, VENOUS THROMBOSIS/EMBOLISM 05/15/1987  . TOTAL ABDOMINAL HYSTERECTOMY, HX OF 05/14/1986    Current Outpatient Prescriptions on File Prior to Visit  Medication Sig Dispense Refill  . Ascorbic Acid (VITAMIN C) 100 MG tablet Take 100 mg by mouth daily.    [name] aspirin 81 MG tablet Take 81 mg by mouth daily.    . Omega-3 Fatty Acids (FISH OIL) 1000 MG CAPS Take by mouth.     No current facility-administered medications on file prior to visit.     Allergies  Allergen Reactions  . Contrast Media [Iodinated Diagnostic Agents] Anaphylaxis    Patient stopped breathing     Objective:  BP 120/72 (BP Location: Right Arm, Patient Position: Sitting, Cuff Size: Small)   Pulse (!) 106   Temp 98.4 F (36.9 C) (Oral)   Resp 18   Ht 5\' 2"  (1.575 m)   Wt 161 lb (73 kg)   SpO2 95%   BMI 29.45 kg/m   Physical Exam  Constitutional: She is oriented to person, place, and time and well-developed, well-nourished, and in no distress. No distress.     HENT:  Mouth/Throat: Oropharynx is clear and moist and mucous membranes are normal.  Cardiovascular: Regular rhythm and  normal heart sounds.  Tachycardia present.   Pulmonary/Chest: Effort normal. She has no decreased breath sounds. She has no wheezes. She has rhonchi in the right lower field and the left lower field. She has no rales.  Neurological: She is alert and oriented to person, place, and time. GCS score is 15.  Skin: Skin is warm and dry.  Psychiatric: Mood, memory, affect and judgment normal.  Vitals reviewed.   Results for orders placed or  performed in visit on 03/29/16  POCT CBC  Result Value Ref Range   WBC 9.5 4.6 - 10.2 K/uL   Lymph, poc 1.8 0.6 - 3.4   POC LYMPH PERCENT 19.1 10 - 50 %L   MID (cbc) 0.6 0 - 0.9   POC MID % 6.3 0 - 12 %M   POC Granulocyte 7.1 (A) 2 - 6.9   Granulocyte percent 74.6 37 - 80 %G   RBC 4.56 4.04 - 5.48 M/uL   Hemoglobin 12.7 12.2 - 16.2 g/dL   HCT, POC 37.1 (A) 37.7 - 47.9 %   MCV 81.4 80 - 97 fL   MCH, POC 28.0 27 - 31.2 pg   MCHC 34.3 31.8 - 35.4 g/dL   RDW, POC 13.9 %   Platelet Count, POC 258 142 - 424 K/uL   MPV 7.7 0 - 99.8 fL    Assessment and Plan :  1. Cough 2. Hoarseness 3. Pleural effusion - POCT CBC - benzonatate (TESSALON) 100 MG capsule; Take 1-2 capsules (100-200 mg total) by mouth 3 (three) times daily as needed for cough.  Dispense: 40 capsule; Refill: 0 - esomeprazole (NEXIUM) 40 MG capsule; Take 1 capsule (40 mg total) by mouth daily.  Dispense: 30 capsule; Refill: 2 - HYDROcodone-homatropine (HYCODAN) 5-1.5 MG/5ML syrup; Take 5 mLs by mouth every 8 (eight) hours as needed for cough.  Dispense: 120 mL; Refill: 0 - Will treat supportively at this time as her CBC is normal and she has a sceduled CT scan of her chest this afternoon with plans for f/u with pulmonology. Discussed supportive care.  - Keep f/u appt with pulm Nov 29.   4. Chronic pain of both knees - capsicum (ZOSTRIX) 0.075 % topical cream; Apply 1 application topically 2 (two) times daily.  Dispense: 28.3 g; Refill: 0 - Keep f/u appt with rheumatology in Dec.    Mercer Pod, PA-C  [facility] 03/29/2016 11:08 AM

## 2016-04-02 ENCOUNTER — Inpatient Hospital Stay: Admission: RE | Admit: 2016-04-02 | Payer: BLUE CROSS/BLUE SHIELD | Source: Ambulatory Visit

## 2016-04-03 ENCOUNTER — Telehealth: Payer: Self-pay

## 2016-04-03 NOTE — Telephone Encounter (Signed)
Pt's daughter called, pt went back to work today (was out from the 11th to today). Work is requesting a dr's note and wants to know that she is safe to have returned to work.   Fax number for the note is [phone]  Daughter's call back for any questions is  [phone]

## 2016-04-03 NOTE — Telephone Encounter (Signed)
Pt called back stating the fax number on the original message is incorrect and it should be [phone]

## 2016-04-04 ENCOUNTER — Telehealth: Payer: Self-pay

## 2016-04-04 NOTE — Telephone Encounter (Signed)
Note refaxed to a different number.  [phone].

## 2016-04-04 NOTE — Telephone Encounter (Signed)
Patient called requesting a note from 03/29/16 until today.  Spoke with [facility] about this and she stated the patient could only have a note from 03/29/16-03/31/2016.  I wrote the note and faxed it to patient's work as requested.

## 2016-04-11 ENCOUNTER — Ambulatory Visit (INDEPENDENT_AMBULATORY_CARE_PROVIDER_SITE_OTHER): Payer: BLUE CROSS/BLUE SHIELD | Admitting: Emergency Medicine

## 2016-04-11 ENCOUNTER — Encounter: Payer: Self-pay | Admitting: Emergency Medicine

## 2016-04-11 DIAGNOSIS — J90 Pleural effusion, not elsewhere classified: Secondary | ICD-10-CM | POA: Diagnosis not present

## 2016-04-11 NOTE — Assessment & Plan Note (Signed)
I suspect that this is an autoimmune effusion, probably lupus given the elevated ANA. Now there is residual pleural thickening without obvious fluid, some mild parenchymal inflammatory disease. She is asymptomatic. She is seeing a rheumatologist and we will investigate to find out which physician this is. Depending on the workup and whether she ends up on medications we will need to consider pleural biopsy or any other workup.  Please follow with Rheumatology as planned. I believe that the inflammation around your right lung was due to a systemic inflammatory disease. Specifically you need to discuss your elevated blood test called ANA.  Depending on your evaluation by rheumatology we may need to discuss and consider other testing including possible biopsy of the lining around your right lung.  Follow with Dr Lamonte Sakai in 3 months or sooner if you have any problems.

## 2016-04-11 NOTE — Progress Notes (Signed)
Subjective:    Patient ID: Shelia Berg, female    DOB: 01/11/1951, 65 y.o.   MRN: [id number]  HPI  65 year old former smoker (35 pk-yrs) with a history of OA, bladder CA (treated 2010). She was admitted with a RLL PNA in 05/2014. At that time had a CT Chest that showed some pulmonary nodules. She was seen 01/17/16 for right-sided pleuritic chest pain and for 4 days. Her evaluation included a chest x-ray that showed a small right pleural effusion. Large slightly on a subsequent chest x-ray from 01/19/16. CT scan of the chest from 01/19/16 was personally reviewed by me. This showed stable 5 mm right lateral chest wall pulmonary nodule, stable 4 mm right upper lobe and left lung apex nodules. Stable atelectasis in the right middle lobe. All right pleural effusion. She had an elevated d-dimer and a ventilation/perfusion scan was performed that showed low probability for pulmonary embolism (01/20/16). He was treated empirically for a possible community acquired pneumonia. A follow-up chest x-ray performed 02/13/16 and then 02/20/16 showed persistent effusion. Her chest pain at the time had persisted. She is still having focal R pleuritic pain that is worse with movement, cough.   ROV 04/11/16 -- This is a follow-up visit for patient with history tobacco use and bladder cancer. She was treated for a community-acquired pneumonia and has had a persistent right sided pleural effusion with associated pleuritic pain. We performed an ANA that was positive with a dilution of 1:640. RF was negative. We repeated her CT scan of the chest on 03/29/16 that I personally reviewed. This showed diffuse esophageal wall thickening, stable pulmonary nodules as described in the previous scan. Significant increase in right-sided pleural thickening in absence of clear effusion. She tells me that she had hoarse voice and cough about 2 weeks ago, associated with some dyspnea. Was treated with cough suppressants. She tells me that her chest pain  and her back pain are resolved. She was sent to rheumatology by her PCP - labs redrawn and pending.   Review of Systems  Constitutional: Negative for fever and unexpected weight change.  HENT: Negative for congestion, dental problem, ear pain, nosebleeds, postnasal drip, rhinorrhea, sinus pressure, sneezing, sore throat and trouble swallowing.   Eyes: Negative for redness and itching.  Respiratory: Negative for cough, chest tightness, shortness of breath and wheezing.   Cardiovascular: Positive for chest pain. Negative for palpitations and leg swelling.  Gastrointestinal: Negative for nausea and vomiting.  Genitourinary: Negative for dysuria.  Musculoskeletal: Negative for joint swelling.  Skin: Negative for rash.  Neurological: Negative for headaches.  Hematological: Does not bruise/bleed easily.  Psychiatric/Behavioral: Negative for dysphoric mood. The patient is not nervous/anxious.     Past Medical History:  Diagnosis Date  . Bladder cancer ([place]) 6 years ago     No family history on file.   Social History   Social History  . Marital status: Married    Spouse name: N/A  . Number of children: N/A  . Years of education: N/A   Occupational History  . Not on file.   Social History Main Topics  . Smoking status: Former Smoker    Packs/day: 0.50    Years: 30.00    Types: Cigarettes    Quit date: 06/05/2014  . Smokeless tobacco: Never Used  . Alcohol use No  . Drug use: No  . Sexual activity: Yes   Other Topics Concern  . Not on file   Social History Narrative  . No narrative on  file  works as a Librarian, academic at [employer].  Originally from [place], no other exposures known. .......................................................................................................................................................................................................................................................................[name]  Allergies  Allergen Reactions  .  Contrast Media [Iodinated Diagnostic Agents] Anaphylaxis    Patient stopped breathing     Outpatient Medications Prior to Visit  Medication Sig Dispense Refill  . Ascorbic Acid (VITAMIN C) 100 MG tablet Take 100 mg by mouth daily.    [name] aspirin 81 MG tablet Take 81 mg by mouth daily.    . benzonatate (TESSALON) 100 MG capsule Take 1-2 capsules (100-200 mg total) by mouth 3 (three) times daily as needed for cough. 40 capsule 0  . esomeprazole (NEXIUM) 40 MG capsule Take 1 capsule (40 mg total) by mouth daily. 30 capsule 2  . HYDROcodone-homatropine (HYCODAN) 5-1.5 MG/5ML syrup Take 5 mLs by mouth every 8 (eight) hours as needed for cough. 120 mL 0  . Omega-3 Fatty Acids (FISH OIL) 1000 MG CAPS Take by mouth.    . capsicum (ZOSTRIX) 0.075 % topical cream Apply 1 application topically 2 (two) times daily. (Patient not taking: Reported on 04/11/2016) 28.3 g 0   No facility-administered medications prior to visit.         Objective:   Physical Exam Vitals:   04/11/16 1539  BP: 126/70  Pulse: 87  SpO2: 97%  Weight: 165 lb 6.4 oz (75 kg)  Height: 5\' 2"  (1.575 m)   Gen: Pleasant, overwt woman, in no distress,  normal affect  ENT: No lesions,  mouth clear,  oropharynx clear, no postnasal drip  Neck: No JVD, no TMG, no carotid bruits  Lungs: No use of accessory muscles, decrease at R base, clear without rales or rhonchi  Cardiovascular: RRR, heart sounds normal, no murmur or gallops, no peripheral edema  Musculoskeletal: No deformities, tender to palpation of the R costal margin.   Neuro: alert, non focal  Skin: Warm, no lesions or rashes   03/29/16 --  COMPARISON:  01/19/2016 and 06/09/2014  FINDINGS: Cardiovascular: Normal caliber of the thoracic area with atherosclerotic calcifications. There are some coronary artery calcifications. Normal caliber of the main pulmonary artery.  Mediastinum/Nodes: There appears to be diffuse esophageal wall thickening but this is  similar to the previous examination and nonspecific. No significant chest lymphadenopathy. Visualized thyroid tissue is unremarkable.  Lungs/Pleura: Trachea and mainstem bronchi are patent. Stable mild scarring at the lung apices. Stable old inflammatory changes in the posterior right upper lobe. Stable 6 mm irregular shaped nodule in the right upper lobe on sequence 3, image 56. Stable pleural-based linear density in the right middle lobe on image 70. There may be new subtle inflammatory changes along the posterior right middle lobe. There is a markedly increased of pleural thickening along the lateral right lower chest. Previously, there was a pleural-based nodule in this area but this is no longer visualized due to the increased thickening throughout this entire area. There is a small amount of adjacent parenchymal disease. This lateral pleural thickening involves the right lower lobe and the right middle lobe. Previously, there was a small focus of lateral right pleural thickening measuring up to 0.9 cm in thickness but this pleural thickening now measures up to 1.5 cm with a larger extent. New subtle nodular densities in the superior segment of the left lower lobe are compatible with infectious or inflammatory changes. Tiny nodular density left upper lobe on sequence 3, image 26 may be new. This may represent new subtle inflammatory changes. There is a new 5 mm nodule in  the left upper lobe on image 70. Probable atelectasis at the base of the lingula. Again noted is a tiny nodular density in the left upper lobe on image 48. There is a trace amount of dependent right pleural fluid which may be complex. There is no significant left pleural fluid.  Upper Abdomen: There is a stable 1.9 cm low-density nodule in the right adrenal gland. This is most compatible with a benign adenoma. No gross abnormality to the left adrenal gland. No acute abnormality in the upper  abdomen.  Musculoskeletal: No acute bone abnormality.  IMPRESSION: Increased pleural thickening along the right lower chest compatible with postinflammatory changes. There is only a small amount of pleural fluid or thickening along the dependent aspect of the right lower chest.  Scattered postinflammatory changes throughout both lungs with tiny nodules. Some of the nodules are stable as described. There are new small foci of inflammatory changes in both lungs. Recommend a 3 month follow-up to further evaluate these inflammatory changes, nodules and increasing right pleural thickening.  Possible esophageal wall thickening which has not significantly changed. This is nonspecific and recommend clinical correlation with regards to upper GI symptoms.  Stable right adrenal nodule.  This probably represents an adenoma      Assessment & Plan:  Pleural effusion I suspect that this is an autoimmune effusion, probably lupus given the elevated ANA. Now there is residual pleural thickening without obvious fluid, some mild parenchymal inflammatory disease. She is asymptomatic. She is seeing a rheumatologist and we will investigate to find out which physician this is. Depending on the workup and whether she ends up on medications we will need to consider pleural biopsy or any other workup.  Please follow with Rheumatology as planned. I believe that the inflammation around your right lung was due to a systemic inflammatory disease. Specifically you need to discuss your elevated blood test called ANA.  Depending on your evaluation by rheumatology we may need to discuss and consider other testing including possible biopsy of the lining around your right lung.  Follow with Dr Lamonte Sakai in 3 months or sooner if you have any problems.  Baltazar Apo, MD, PhD 04/11/2016, 4:13 PM [facility] [phone] or if no answer [phone]

## 2016-04-11 NOTE — Patient Instructions (Signed)
Please follow with Rheumatology as planned. I believe that the inflammation around your right lung was due to a systemic inflammatory disease. Specifically you need to discuss your elevated blood test called ANA.  Depending on your evaluation by rheumatology we may need to discuss and consider other testing including possible biopsy of the lining around your right lung.  Follow with Dr Lamonte Sakai in 3 months or sooner if you have any problems.

## 2016-06-09 ENCOUNTER — Encounter (HOSPITAL_COMMUNITY): Payer: Self-pay | Admitting: Emergency Medicine

## 2016-06-09 ENCOUNTER — Ambulatory Visit (INDEPENDENT_AMBULATORY_CARE_PROVIDER_SITE_OTHER): Payer: BLUE CROSS/BLUE SHIELD

## 2016-06-09 ENCOUNTER — Ambulatory Visit (HOSPITAL_COMMUNITY)
Admission: EM | Admit: 2016-06-09 | Discharge: 2016-06-09 | Disposition: A | Discharge status: Home / Self Care | Payer: BLUE CROSS/BLUE SHIELD | Attending: Family Medicine | Admitting: Family Medicine

## 2016-06-09 DIAGNOSIS — J111 Influenza due to unidentified influenza virus with other respiratory manifestations: Secondary | ICD-10-CM

## 2016-06-09 DIAGNOSIS — R69 Illness, unspecified: Secondary | ICD-10-CM

## 2016-06-09 HISTORY — DX: Unspecified osteoarthritis, unspecified site: M19.90

## 2016-06-09 MED ORDER — IPRATROPIUM BROMIDE 0.06 % NA SOLN: 2.0000 | Freq: Four times a day (QID) | NASAL | 1 refills | Status: DC

## 2016-06-09 NOTE — ED Provider Notes (Signed)
[place]    CSN: [id number] Arrival date & time: 06/09/16  1229     History   Chief Complaint Chief Complaint  Patient presents with  . URI    HPI Shelia Berg is a 66 y.o. female.   The history is provided by the patient.  URI  Presenting symptoms: congestion, cough, fever and rhinorrhea   Severity:  Moderate Onset quality:  Sudden Duration:  1 day Progression:  Worsening Chronicity:  New Relieved by:  None tried Worsened by:  Nothing Ineffective treatments:  None tried Associated symptoms: myalgias   Risk factors: being elderly     Past Medical History:  Diagnosis Date  . Arthritis   . Bladder cancer ([place]) 6 years ago    Patient Active Problem List   Diagnosis Date Noted  . Pleuritic chest pain 03/05/2016  . Pleural effusion 03/05/2016  . Tobacco dependence in remission 03/05/2016  . Hyperkalemia 08/04/2015  . Hypokalemia 06/07/2014  . Osteoarthritis 10/03/2006  . FRACTURE, WRIST, LEFT 05/20/2001  . HX, PERSONAL, VENOUS THROMBOSIS/EMBOLISM 05/15/1987  . TOTAL ABDOMINAL HYSTERECTOMY, HX OF 05/14/1986    Past Surgical History:  Procedure Laterality Date  . ABDOMINAL HYSTERECTOMY      OB History    No data available       Home Medications    Prior to Admission medications   Medication Sig Start Date End Date Taking? Authorizing Provider  Ascorbic Acid (VITAMIN C) 100 MG tablet Take 100 mg by mouth daily.   Yes Historical Provider, MD  aspirin 81 MG tablet Take 81 mg by mouth daily.   Yes Historical Provider, MD  Omega-3 Fatty Acids (FISH OIL) 1000 MG CAPS Take by mouth.   Yes Historical Provider, MD  benzonatate (TESSALON) 100 MG capsule Take 1-2 capsules (100-200 mg total) by mouth 3 (three) times daily as needed for cough. Patient not taking: Reported on 06/09/2016 03/29/16   Gelene Mink McVey, PA-C  capsicum (ZOSTRIX) 0.075 % topical cream Apply 1 application topically 2 (two) times daily. Patient not taking: Reported on  04/11/2016 03/29/16   Gelene Mink McVey, PA-C  esomeprazole (NEXIUM) 40 MG capsule Take 1 capsule (40 mg total) by mouth daily. 03/29/16   Elizabeth Whitney McVey, PA-C  HYDROcodone-homatropine [facility]) 5-1.5 MG/5ML syrup Take 5 mLs by mouth every 8 (eight) hours as needed for cough. Patient not taking: Reported on 06/09/2016 03/29/16   Gelene Mink McVey, PA-C  ipratropium (ATROVENT) 0.06 % nasal spray Place 2 sprays into both nostrils 4 (four) times daily. 06/09/16   Billy Fischer, MD    Family History No family history on file.  Social History Social History  Substance Use Topics  . Smoking status: Former Smoker    Packs/day: 0.50    Years: 30.00    Types: Cigarettes    Quit date: 06/05/2014  . Smokeless tobacco: Never Used  . Alcohol use No     Allergies   Contrast media [iodinated diagnostic agents]   Review of Systems Review of Systems  Constitutional: Positive for activity change, appetite change, chills and fever.  HENT: Positive for congestion, postnasal drip and rhinorrhea.   Respiratory: Positive for cough. Negative for shortness of breath.   Cardiovascular: Negative.   Gastrointestinal: Negative.   Genitourinary: Negative.   Musculoskeletal: Positive for myalgias.  All other systems reviewed and are negative.    Physical Exam Triage Vital Signs ED Triage Vitals  Enc Vitals Group     BP 06/09/16 1424 124/76  Pulse Rate 06/09/16 1424 93     Resp 06/09/16 1424 22     Temp 06/09/16 1424 99.4 F (37.4 C)     Temp Source 06/09/16 1424 Oral     SpO2 06/09/16 1424 96 %     Weight --      Height --      Head Circumference --      Peak Flow --      Pain Score 06/09/16 1423 9     Pain Loc --      Pain Edu? --      Excl. in [place]? --    No data found.   Updated Vital Signs BP 124/76 (BP Location: Left Arm)   Pulse 93   Temp 99.4 F (37.4 C) (Oral)   Resp 22   SpO2 96%   Visual Acuity Right Eye Distance:   Left Eye Distance:     Bilateral Distance:    Right Eye Near:   Left Eye Near:    Bilateral Near:     Physical Exam  Constitutional: She is oriented to person, place, and time. She appears well-developed and well-nourished. No distress.  HENT:  Right Ear: External ear normal.  Left Ear: External ear normal.  Nose: Nose normal.  Mouth/Throat: Oropharynx is clear and moist.  Eyes: EOM are normal. Pupils are equal, round, and reactive to light.  Neck: Normal range of motion.  Cardiovascular: Normal rate and regular rhythm.   Pulmonary/Chest: Effort normal. She has wheezes in the right upper field. She has rhonchi in the right upper field.      Abdominal: Soft. Bowel sounds are normal. There is no tenderness.  Lymphadenopathy:    She has no cervical adenopathy.  Neurological: She is alert and oriented to person, place, and time.  Skin: Skin is warm and dry.     UC Treatments / Results  Labs (all labs ordered are listed, but only abnormal results are displayed) Labs Reviewed - No data to display  EKG  EKG Interpretation None       Radiology No results found. X-rays reviewed and report per radiologist.  Procedures Procedures (including critical care time)  Medications Ordered in UC Medications - No data to display   Initial Impression / Assessment and Plan / UC Course  I have reviewed the triage vital signs and the nursing notes.  Pertinent labs & imaging results that were available during my care of the patient were reviewed by me and considered in my medical decision making (see chart for details).       Final Clinical Impressions(s) / UC Diagnoses   Final diagnoses:  Influenza-like illness    New Prescriptions Discharge Medication List as of 06/09/2016  3:20 PM    START taking these medications   Details  ipratropium (ATROVENT) 0.06 % nasal spray Place 2 sprays into both nostrils 4 (four) times daily., Starting Sat 06/09/2016, Normal         Billy Fischer,  MD 06/14/16 1432

## 2016-06-09 NOTE — ED Triage Notes (Signed)
Reports feeling feverish yesterday, having acough and soreness in throat, chest and back

## 2016-06-14 ENCOUNTER — Ambulatory Visit (INDEPENDENT_AMBULATORY_CARE_PROVIDER_SITE_OTHER): Payer: BLUE CROSS/BLUE SHIELD

## 2016-06-14 ENCOUNTER — Ambulatory Visit (INDEPENDENT_AMBULATORY_CARE_PROVIDER_SITE_OTHER): Payer: BLUE CROSS/BLUE SHIELD | Admitting: Family Medicine

## 2016-06-14 VITALS — BP 124/65 | HR 96 | Temp 97.5°F | Resp 18 | Ht 62.0 in | Wt 158.0 lb

## 2016-06-14 DIAGNOSIS — R059 Cough, unspecified: Secondary | ICD-10-CM

## 2016-06-14 DIAGNOSIS — J441 Chronic obstructive pulmonary disease with (acute) exacerbation: Secondary | ICD-10-CM

## 2016-06-14 DIAGNOSIS — R062 Wheezing: Secondary | ICD-10-CM

## 2016-06-14 DIAGNOSIS — R05 Cough: Secondary | ICD-10-CM | POA: Diagnosis not present

## 2016-06-14 MED ORDER — ALBUTEROL SULFATE HFA 108 (90 BASE) MCG/ACT IN AERS: 2.0000 | INHALATION_SPRAY | Freq: Four times a day (QID) | RESPIRATORY_TRACT | 0 refills | Status: DC | PRN

## 2016-06-14 MED ORDER — PREDNISONE 50 MG PO TABS: ORAL_TABLET | ORAL | 0 refills | Status: DC

## 2016-06-14 MED ORDER — DOXYCYCLINE HYCLATE 100 MG PO TABS: 100.0000 mg | ORAL_TABLET | Freq: Two times a day (BID) | ORAL | 0 refills | Status: DC

## 2016-06-14 MED ORDER — IPRATROPIUM BROMIDE 0.02 % IN SOLN
0.5000 mg | Freq: Once | RESPIRATORY_TRACT | Status: AC
  Administered 2016-06-14: 0.5 mg via RESPIRATORY_TRACT

## 2016-06-14 MED ORDER — ALBUTEROL SULFATE (2.5 MG/3ML) 0.083% IN NEBU
2.5000 mg | INHALATION_SOLUTION | Freq: Once | RESPIRATORY_TRACT | Status: AC
  Administered 2016-06-14: 2.5 mg via RESPIRATORY_TRACT

## 2016-06-14 NOTE — Progress Notes (Signed)
Shelia Berg is a 66 y.o. female who presents to [place] at [facility] today for flu-like symptoms:  1.  Flu-like symptoms:  Patient states that her symptoms started last Friday. Initially started with fever. Presented to [facility] urgent care and diagnosed with flulike symptoms. She was prescribed nasal Atrovent. She's had ongoing fevers for a day or so. She felt better earlier this week. She initially had myalgias and arthralgias of these have improved.  However the second half of the week she began having recurrence of fevers and chills. This coincided with start of cough.  Malaise is ongoing.  Hasn't really left bed due to fatigue and "feeling bad." She has very little appetite but she is trying to force herself to eat soup. She is staying hydrated well.  No N/V.  No dyspnea on exertion though she has not left her house nor really exerted herself. No orthopnea at night.. No chest pain. No palpitations. No abdominal pain. No lower extremity edema.  She does have chronic pleural effusion/scarring on the right-hand side for which she is being evaluated by both pulmonology and rheumatology, she also has a positive ANA with high titers.  ROS as above.   PMH reviewed. Patient is a nonsmoker.   Past Medical History:  Diagnosis Date  . Arthritis   . Bladder cancer ([place]) 6 years ago   Past Surgical History:  Procedure Laterality Date  . ABDOMINAL HYSTERECTOMY      Medications reviewed. Current Outpatient Prescriptions  Medication Sig Dispense Refill  . Ascorbic Acid (VITAMIN C) 100 MG tablet Take 100 mg by mouth daily.    [name] aspirin 81 MG tablet Take 81 mg by mouth daily.    . Omega-3 Fatty Acids (FISH OIL) 1000 MG CAPS Take by mouth.    [name] HYDROcodone-homatropine (HYCODAN) 5-1.5 MG/5ML syrup Take 5 mLs by mouth every 8 (eight) hours as needed for cough. (Patient not taking: Reported on 06/09/2016) 120 mL 0   No current facility-administered medications for this visit.      Physical  Exam:  BP 124/65   Pulse 96   Temp 97.5 F (36.4 C) (Oral)   Resp 18   Ht 5\' 2"  (1.575 m)   Wt 158 lb (71.7 kg)   SpO2 96%   BMI 28.90 kg/m  Gen:  Patient sitting on exam table, appears stated age in no acute distress Head: Normocephalic atraumatic Eyes: EOMI, PERRL, sclera and conjunctiva non-erythematous Ears:  Canals clear bilaterally.  TMs pearly gray bilaterally without erythema or bulging.   Nose:  Nasal turbinates with clear thin mucus BL Mouth: Mucosa membranes moist. Tonsils +2, nonenlarged but erythematous. Neck: No cervical lymphadenopathy noted Heart:  RRR, no murmurs auscultated. Pulm:  Diffuse wheezing in all lung fields.  She does have some crackles Right lung base.    Assessment and Plan:  1.  COPD exacerbation: - Diffuse wheezing in a former smoker. Likely exacerbated by viral illness. Perhaps sloughed. She has no further myalgias reported fevers that she does have continued malaise. -She did receive an albuterol/Atrovent accommodation nebulized treatment here in clinic. She had improvement of her lung exams although still with wheezing at bases status post nebulizer treatment. - Also treating with doxy + prednisone  - She is not on any controller or rescue inhalers at home. She has follow-up with pulmonology later this month.  Will defer selection of controller medications to them.  - FU if no improvement by next week.  Sooner if worsening.

## 2016-06-14 NOTE — Patient Instructions (Addendum)
It was good to me today.  You do have bronchitis.  Take the prednisone one pill a day for the next 5 days.  Take the doxycycline twice daily once in the morning and once in the evening for the next 7 days. This is the antibiotic.  Use the inhaler every 4-6 hours to help with your breathing. Also use this before you sleep at night.    IF you received an x-ray today, you will receive an invoice from [facility]. Please contact [facility] at [phone] with questions or concerns regarding your invoice.   IF you received labwork today, you will receive an invoice from [facility]. Please contact [facility] at [phone] with questions or concerns regarding your invoice.   Our billing staff will not be able to assist you with questions regarding bills from these companies.  You will be contacted with the lab results as soon as they are available. The fastest way to get your results is to activate your My Chart account. Instructions are located on the last page of this paperwork. If you have not heard from [place] regarding the results in 2 weeks, please contact this office.

## 2016-07-11 ENCOUNTER — Ambulatory Visit: Payer: BLUE CROSS/BLUE SHIELD | Admitting: Emergency Medicine

## 2016-08-06 ENCOUNTER — Telehealth: Payer: Self-pay | Admitting: Emergency Medicine

## 2016-08-06 NOTE — Telephone Encounter (Signed)
Spoke with pt's daughter Estill Bamberg (dpr on file), requesting asap appt with any provider.  Offered appt with TP tomorrow morning, states she will have to call back after speaking to pt.  Will await call back.

## 2016-08-07 NOTE — Telephone Encounter (Signed)
lmtcb X1 for pt's daughter Estill Bamberg

## 2016-08-13 NOTE — Telephone Encounter (Signed)
Lmtcb for pt's daughter.  

## 2016-08-14 NOTE — Telephone Encounter (Signed)
LMTCB for Shelia Berg 

## 2016-08-15 NOTE — Telephone Encounter (Signed)
LM x 4 to call back to schedule appt if still needed Per triage protocol message will be closed

## 2016-08-21 ENCOUNTER — Other Ambulatory Visit: Payer: BLUE CROSS/BLUE SHIELD

## 2016-08-21 ENCOUNTER — Telehealth: Payer: Self-pay

## 2016-08-21 ENCOUNTER — Encounter: Payer: Self-pay | Admitting: Emergency Medicine

## 2016-08-21 ENCOUNTER — Ambulatory Visit (INDEPENDENT_AMBULATORY_CARE_PROVIDER_SITE_OTHER): Payer: BLUE CROSS/BLUE SHIELD | Admitting: Emergency Medicine

## 2016-08-21 ENCOUNTER — Ambulatory Visit (INDEPENDENT_AMBULATORY_CARE_PROVIDER_SITE_OTHER)
Admission: RE | Admit: 2016-08-21 | Discharge: 2016-08-21 | Disposition: A | Discharge status: Home / Self Care | Payer: BLUE CROSS/BLUE SHIELD | Source: Ambulatory Visit | Attending: Emergency Medicine | Admitting: Emergency Medicine

## 2016-08-21 VITALS — BP 132/60 | HR 86 | Ht 62.0 in | Wt 167.2 lb

## 2016-08-21 DIAGNOSIS — R091 Pleurisy: Secondary | ICD-10-CM | POA: Diagnosis not present

## 2016-08-21 DIAGNOSIS — J90 Pleural effusion, not elsewhere classified: Secondary | ICD-10-CM

## 2016-08-21 MED ORDER — PREDNISONE 10 MG PO TABS: ORAL_TABLET | ORAL | 0 refills | Status: DC

## 2016-08-21 NOTE — Progress Notes (Signed)
Subjective:    Patient ID: Shelia Berg, female    DOB: [birth year], 65 y.o.   MRN: [id number]  HPI  66 year old former smoker (35 pk-yrs) with a history of OA, bladder CA (treated 2010). She was admitted with a RLL PNA in 05/2014. At that time had a CT Chest that showed some pulmonary nodules. She was seen 01/17/16 for right-sided pleuritic chest pain and for 4 days. Her evaluation included a chest x-ray that showed a small right pleural effusion. Large slightly on a subsequent chest x-ray from 01/19/16. CT scan of the chest from 01/19/16 was personally reviewed by me. This showed stable 5 mm right lateral chest wall pulmonary nodule, stable 4 mm right upper lobe and left lung apex nodules. Stable atelectasis in the right middle lobe. All right pleural effusion. She had an elevated d-dimer and a ventilation/perfusion scan was performed that showed low probability for pulmonary embolism (01/20/16). He was treated empirically for a possible community acquired pneumonia. A follow-up chest x-ray performed 02/13/16 and then 02/20/16 showed persistent effusion. Her chest pain at the time had persisted. She is still having focal R pleuritic pain that is worse with movement, cough.   ROV 04/11/16 -- This is a follow-up visit for patient with history tobacco use and bladder cancer. She was treated for a community-acquired pneumonia and has had a persistent right sided pleural effusion with associated pleuritic pain. We performed an ANA that was positive with a dilution of 1:640. RF was negative. We repeated her CT scan of the chest on 03/29/16 that I personally reviewed. This showed diffuse esophageal wall thickening, stable pulmonary nodules as described in the previous scan. Significant increase in right-sided pleural thickening in absence of clear effusion. She tells me that she had hoarse voice and cough about 2 weeks ago, associated with some dyspnea. Was treated with cough suppressants. She tells me that her chest pain  and her back pain are resolved. She was sent to rheumatology by her PCP - labs redrawn and pending.   ROV 08/21/16 -- pt with hx tobacco, pleuritic pain and effusion. eval revealed ANA positive (1:640), suggestive auto-immune pleurisy and effusion. She returns today c/o R mid-back, R upper chest / pectoral, worst at night and w deep breathing. She was treated for an AE COPD in February w steroids and abx. CXR 06/14/16 showed no effusion.   Review of Systems  Constitutional: Negative for fever and unexpected weight change.  HENT: Negative for congestion, dental problem, ear pain, nosebleeds, postnasal drip, rhinorrhea, sinus pressure, sneezing, sore throat and trouble swallowing.   Eyes: Negative for redness and itching.  Respiratory: Negative for cough, chest tightness, shortness of breath and wheezing.   Cardiovascular: Positive for chest pain. Negative for palpitations and leg swelling.  Gastrointestinal: Negative for nausea and vomiting.  Genitourinary: Negative for dysuria.  Musculoskeletal: Negative for joint swelling.  Skin: Negative for rash.  Neurological: Negative for headaches.  Hematological: Does not bruise/bleed easily.  Psychiatric/Behavioral: Negative for dysphoric mood. The patient is not nervous/anxious.     Past Medical History:  Diagnosis Date  . Arthritis   . Bladder cancer ([place]) 6 years ago     No family history on file.   Social History   Social History  . Marital status: Married    Spouse name: N/A  . Number of children: N/A  . Years of education: N/A   Occupational History  . Not on file.   Social History Main Topics  . Smoking status: Former  Smoker    Packs/day: 0.50    Years: 30.00    Types: Cigarettes    Quit date: 06/05/2014  . Smokeless tobacco: Never Used  . Alcohol use No  . Drug use: No  . Sexual activity: Yes   Other Topics Concern  . Not on file   Social History Narrative  . 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    Originally from [place], no other exposures known. .......................................................................................................................................................................................................................................................................[name]  Allergies  Allergen Reactions  . Contrast Media [Iodinated Diagnostic Agents] Anaphylaxis    Patient stopped breathing     Outpatient Medications Prior to Visit  Medication Sig Dispense Refill  . albuterol (PROVENTIL HFA;VENTOLIN HFA) 108 (90 Base) MCG/ACT inhaler Inhale 2 puffs into the lungs every 6 (six) hours as needed for wheezing or shortness of breath. 1 Inhaler 0  . Ascorbic Acid (VITAMIN C) 100 MG tablet Take 100 mg by mouth daily.    [name] aspirin 81 MG tablet Take 81 mg by mouth daily.    . Omega-3 Fatty Acids (FISH OIL) 1000 MG CAPS Take by mouth.    . doxycycline (VIBRA-TABS) 100 MG tablet Take 1 tablet (100 mg total) by mouth 2 (two) times daily. 20 tablet 0  . HYDROcodone-homatropine (HYCODAN) 5-1.5 MG/5ML syrup Take 5 mLs by mouth every 8 (eight) hours as needed for cough. 120 mL 0  . predniSONE (DELTASONE) 50 MG tablet Take 1 tab po daily x 5 days 5 tablet 0   No facility-administered medications prior to visit.         Objective:   Physical Exam Vitals:   08/21/16 1652  BP: 132/60  Pulse: 86  SpO2: 96%  Weight: 167 lb 3.2 oz (75.8 kg)  Height: 5\' 2"  (1.575 m)   Gen: Pleasant, overwt woman, in no distress,  normal affect  ENT: No lesions,  mouth clear,  oropharynx clear, no postnasal drip  Neck: No JVD, no TMG, no carotid bruits  Lungs: No use of accessory muscles, decrease at R base, clear without rales or rhonchi  Cardiovascular: RRR, heart sounds normal, no murmur or gallops, no peripheral edema  Musculoskeletal: No deformities, tender to palpation of the R costal margin.   Neuro: alert, non focal  Skin: Warm, no lesions or  rashes   03/29/16 --  COMPARISON:  01/19/2016 and 06/09/2014  FINDINGS: Cardiovascular: Normal caliber of the thoracic area with atherosclerotic calcifications. There are some coronary artery calcifications. Normal caliber of the main pulmonary artery.  Mediastinum/Nodes: There appears to be diffuse esophageal wall thickening but this is similar to the previous examination and nonspecific. No significant chest lymphadenopathy. Visualized thyroid tissue is unremarkable.  Lungs/Pleura: Trachea and mainstem bronchi are patent. Stable mild scarring at the lung apices. Stable old inflammatory changes in the posterior right upper lobe. Stable 6 mm irregular shaped nodule in the right upper lobe on sequence 3, image 56. Stable pleural-based linear density in the right middle lobe on image 70. There may be new subtle inflammatory changes along the posterior right middle lobe. There is a markedly increased of pleural thickening along the lateral right lower chest. Previously, there was a pleural-based nodule in this area but this is no longer visualized due to the increased thickening throughout this entire area. There is a small amount of adjacent parenchymal disease. This lateral pleural thickening involves the right lower lobe and the right middle lobe. Previously, there was a small focus of lateral right pleural thickening measuring up to 0.9  cm in thickness but this pleural thickening now measures up to 1.5 cm with a larger extent. New subtle nodular densities in the superior segment of the left lower lobe are compatible with infectious or inflammatory changes. Tiny nodular density left upper lobe on sequence 3, image 26 may be new. This may represent new subtle inflammatory changes. There is a new 5 mm nodule in the left upper lobe on image 70. Probable atelectasis at the base of the lingula. Again noted is a tiny nodular density in the left upper lobe on image 48. There is a  trace amount of dependent right pleural fluid which may be complex. There is no significant left pleural fluid.  Upper Abdomen: There is a stable 1.9 cm low-density nodule in the right adrenal gland. This is most compatible with a benign adenoma. No gross abnormality to the left adrenal gland. No acute abnormality in the upper abdomen.  Musculoskeletal: No acute bone abnormality.  IMPRESSION: Increased pleural thickening along the right lower chest compatible with postinflammatory changes. There is only a small amount of pleural fluid or thickening along the dependent aspect of the right lower chest.  Scattered postinflammatory changes throughout both lungs with tiny nodules. Some of the nodules are stable as described. There are new small foci of inflammatory changes in both lungs. Recommend a 3 month follow-up to further evaluate these inflammatory changes, nodules and increasing right pleural thickening.  Possible esophageal wall thickening which has not significantly changed. This is nonspecific and recommend clinical correlation with regards to upper GI symptoms.  Stable right adrenal nodule.  This probably represents an adenoma      Assessment & Plan:  Pleurisy with effusion She is still having R sided pain. Good breath sounds on that side today's exam. I am concerned that this is SLE pleurisy - she is unable to tell me what the eval or rx was with rheumatology. Will need to call daughter, find out who she saw and get the otes. In meantime, since she is having pain, I will start pred 30mg  qd x 2 weeks and have her follow up here to recheck. Repeat ANA today and check CXR today.   We will repeat your blood work this week.  Please start Prednisone 30mg  daily until our next visit.  Follow with T Parrett in 2 weeks to assess your symptoms and to decide how to dose the prednisone  We will need to get the notes from the rheumatologist that you saw. We will contact your  daughter to find out who this was.  Follow with Dr Lamonte Sakai next available.   Baltazar Apo, MD, PhD 08/21/2016, 6:00 PM [facility] [phone] or if no answer [phone]

## 2016-08-21 NOTE — Telephone Encounter (Signed)
Call daughter and see which rheumatologist she went to and get notes faxed to [place]

## 2016-08-21 NOTE — Patient Instructions (Signed)
We will repeat your blood work this week.  Please start Prednisone 30mg  daily until our next visit.  Follow with T Parrett in 2 weeks to assess your symptoms and to decide how to dose the prednisone  We will need to get the notes from the rheumatologist that you saw. We will contact your daughter to find out who this was.  Follow with Dr Lamonte Sakai next available.

## 2016-08-21 NOTE — Assessment & Plan Note (Signed)
She is still having R sided pain. Good breath sounds on that side today's exam. I am concerned that this is SLE pleurisy - she is unable to tell me what the eval or rx was with rheumatology. Will need to call daughter, find out who she saw and get the otes. In meantime, since she is having pain, I will start pred 30mg  qd x 2 weeks and have her follow up here to recheck. Repeat ANA today and check CXR today.   We will repeat your blood work this week.  Please start Prednisone 30mg  daily until our next visit.  Follow with T Parrett in 2 weeks to assess your symptoms and to decide how to dose the prednisone  We will need to get the notes from the rheumatologist that you saw. We will contact your daughter to find out who this was.  Follow with Dr Lamonte Sakai next available.

## 2016-08-22 LAB — ENA+DNA/DS+SJORGEN'S
ENA SSB (LA) AB: 1 AI — AB (ref 0.0–0.9)
dsDNA Ab: 2 IU/mL (ref 0–9)

## 2016-08-22 LAB — ANA W/REFLEX: Anti Nuclear Antibody(ANA): POSITIVE — AB

## 2016-08-22 NOTE — Telephone Encounter (Signed)
Spoke with the pt's daughter, Estill Bamberg She states she can not recall who her mom was seen by  She will try and figure this out and given [place] a call back when she has an answer, and then we can call and obtain recs Will forward to TA, thanks

## 2016-08-22 NOTE — Telephone Encounter (Signed)
RB, notes in your look at folder.

## 2016-08-22 NOTE — Telephone Encounter (Signed)
Estill Bamberg returning call - she states pt went to [facility] on [address] # [phone] -pr

## 2016-08-22 NOTE — Telephone Encounter (Signed)
[facility] and left a detailed message asking them to fax over the notes.

## 2016-08-23 ENCOUNTER — Telehealth: Payer: Self-pay | Admitting: Emergency Medicine

## 2016-08-23 NOTE — Telephone Encounter (Signed)
Please advise on labs and CXR. Thanks. Pt is aware we will call once we have results.

## 2016-08-24 NOTE — Telephone Encounter (Signed)
Thank you :)

## 2016-08-24 NOTE — Telephone Encounter (Signed)
Please let her know that her CXR shows continued pleural thickening on the right without any new pleural fluid. Her labs show that her ANA remains elevated (the antibody associated with lupus pleurisy). I have reviewed Dr Melissa Noon notes - he does not think she has a syndrome consistent with lupus. If she doesn't have lupus then her pleural disease is not well explained. We will ned to get back together and discuss next steps, hopefully include Dr Amil Amen as well. She may need to have further workup like a pleural biopsy in order to figure this out.

## 2016-08-27 NOTE — Telephone Encounter (Signed)
Spoke with Gaspar Bidding, aware of results.  Please advise Dr Lamonte Sakai when/where you would like to fit this patient in for an appt.  You do not have any openings until 10/15/16. Thanks.

## 2016-09-03 NOTE — Telephone Encounter (Signed)
See if we can get her in with APP. I can see her 6/4

## 2016-09-03 NOTE — Telephone Encounter (Signed)
Spoke with pt daughter, and attempted to make her aware of RB's recommendations. Before I could complete my sentence, Shelia Berg stated her mother is in a lot of pain and she is tried of seeing her mother in pain. Shelia Berg states her mother always has to wait for an appointment. Shelia Berg states that she doesn't know what to do and she hoped that I understood, and if this was my mom I wouldn't want to see her in this much pain. Pt has been scheduled with RB on 09/04/16 @ 9:45. Nothing further needed.   Will route to RB as a FYI

## 2016-09-04 ENCOUNTER — Encounter: Payer: Self-pay | Admitting: Emergency Medicine

## 2016-09-04 ENCOUNTER — Ambulatory Visit (INDEPENDENT_AMBULATORY_CARE_PROVIDER_SITE_OTHER): Payer: BLUE CROSS/BLUE SHIELD | Admitting: Emergency Medicine

## 2016-09-04 VITALS — BP 128/70 | HR 73 | Ht 62.0 in | Wt 162.8 lb

## 2016-09-04 DIAGNOSIS — J90 Pleural effusion, not elsewhere classified: Secondary | ICD-10-CM | POA: Diagnosis not present

## 2016-09-04 DIAGNOSIS — R0781 Pleurodynia: Secondary | ICD-10-CM | POA: Diagnosis not present

## 2016-09-04 MED ORDER — IBUPROFEN 800 MG PO TABS: 800.0000 mg | ORAL_TABLET | Freq: Three times a day (TID) | ORAL | 0 refills | Status: DC | PRN

## 2016-09-04 NOTE — Assessment & Plan Note (Signed)
Etiology unclear. I am still concerned about an autoimmune process although she had a reassuring evaluation by rheumatology. She did not respond to empiric prednisone over the last 2 weeks. I will repeat her CT scan of the chest now. Depending on the findings we will consider thoracentesis versus pleural biopsy. Ibuprofen for pain  We will repeat your CT scan of your chest to evaluate your pleural inflammation and fluid.  You may continue to take Aleve for your pain. You may want to consider changing this back to ibuprofen 600-800mg  three times a day.  We will hold off on a stronger medication for now since they have side effects.  Depending on your Chest CT scan you may need other evaluation like pleural fluid sampling or a pleural biopsy.  Follow with Dr Lamonte Sakai next available to review your scan.

## 2016-09-04 NOTE — Patient Instructions (Addendum)
We will repeat your CT scan of your chest to evaluate your pleural inflammation and fluid.  You may continue to take Aleve for your pain. You may want to consider changing this back to ibuprofen 600-800mg  three times a day.  We will hold off on a stronger medication for now since they have side effects.  Depending on your Chest CT scan you may need other evaluation like pleural fluid sampling or a pleural biopsy.  Follow with Dr Lamonte Sakai next available to review your scan.

## 2016-09-04 NOTE — Progress Notes (Signed)
Subjective:    Patient ID: Shelia Berg, female    DOB: 04-Jan-1951, 66 y.o.   MRN: [id number]  HPI  66 year old former smoker (35 pk-yrs) with a history of OA, bladder CA (treated 2010). She was admitted with a RLL PNA in 05/2014. At that time had a CT Chest that showed some pulmonary nodules. She was seen 01/17/16 for right-sided pleuritic chest pain and for 4 days. Her evaluation included a chest x-ray that showed a small right pleural effusion. Large slightly on a subsequent chest x-ray from 01/19/16. CT scan of the chest from 01/19/16 was personally reviewed by me. This showed stable 5 mm right lateral chest wall pulmonary nodule, stable 4 mm right upper lobe and left lung apex nodules. Stable atelectasis in the right middle lobe. All right pleural effusion. She had an elevated d-dimer and a ventilation/perfusion scan was performed that showed low probability for pulmonary embolism (01/20/16). He was treated empirically for a possible community acquired pneumonia. A follow-up chest x-ray performed 02/13/16 and then 02/20/16 showed persistent effusion. Her chest pain at the time had persisted. She is still having focal R pleuritic pain that is worse with movement, cough.   ROV 04/11/16 -- This is a follow-up visit for patient with history tobacco use and bladder cancer. She was treated for a community-acquired pneumonia and has had a persistent right sided pleural effusion with associated pleuritic pain. We performed an ANA that was positive with a dilution of 1:640. RF was negative. We repeated her CT scan of the chest on 03/29/16 that I personally reviewed. This showed diffuse esophageal wall thickening, stable pulmonary nodules as described in the previous scan. Significant increase in right-sided pleural thickening in absence of clear effusion. She tells me that she had hoarse voice and cough about 2 weeks ago, associated with some dyspnea. Was treated with cough suppressants. She tells me that her chest pain  and her back pain are resolved. She was sent to rheumatology by her PCP - labs redrawn and pending.   ROV 08/21/16 -- pt with hx tobacco, pleuritic pain and effusion. eval revealed ANA positive (1:640), suggestive auto-immune pleurisy and effusion. She returns today c/o R mid-back, R upper chest / pectoral, worst at night and w deep breathing. She was treated for an AE COPD in February w steroids and abx. CXR 06/14/16 showed no effusion.   Acute OV 09/04/16 -- this follow-up visit for patient who had an episode of right pleuritic pain with a pleural effusion. The cup to date has revealed an elevated ANA (1:640). She was seen by Rhrumatology and was told that her eva;l was inconsistent with SLE. She is here with continued R back and flank pain, pleuritic in nature. CXR 4/11 did not show significant reaccumulation of her effusion, did show some pleural thickening. She has been on   Review of Systems  Constitutional: Negative for fever and unexpected weight change.  HENT: Negative for congestion, dental problem, ear pain, nosebleeds, postnasal drip, rhinorrhea, sinus pressure, sneezing, sore throat and trouble swallowing.   Eyes: Negative for redness and itching.  Respiratory: Negative for cough, chest tightness, shortness of breath and wheezing.   Cardiovascular: Positive for chest pain. Negative for palpitations and leg swelling.  Gastrointestinal: Negative for nausea and vomiting.  Genitourinary: Negative for dysuria.  Musculoskeletal: Negative for joint swelling.  Skin: Negative for rash.  Neurological: Negative for headaches.  Hematological: Does not bruise/bleed easily.  Psychiatric/Behavioral: Negative for dysphoric mood. The patient is not nervous/anxious.  Past Medical History:  Diagnosis Date  . Arthritis   . Bladder cancer ([place]) 6 years ago     No family history on file.   Social History   Social History  . Marital status: Married    Spouse name: N/A  . Number of children:  N/A  . Years of education: N/A   Occupational History  . Not on file.   Social History Main Topics  . Smoking status: Former Smoker    Packs/day: 0.50    Years: 30.00    Types: Cigarettes    Quit date: 06/05/2014  . Smokeless tobacco: Never Used  . Alcohol use No  . Drug use: No  . Sexual activity: Yes   Other Topics Concern  . Not on file   Social History Narrative  . 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  Originally from [place], no other exposures known. .......................................................................................................................................................................................................................................................................[name]  Allergies  Allergen Reactions  . Contrast Media [Iodinated Diagnostic Agents] Anaphylaxis    Patient stopped breathing     Outpatient Medications Prior to Visit  Medication Sig Dispense Refill  . albuterol (PROVENTIL HFA;VENTOLIN HFA) 108 (90 Base) MCG/ACT inhaler Inhale 2 puffs into the lungs every 6 (six) hours as needed for wheezing or shortness of breath. 1 Inhaler 0  . Ascorbic Acid (VITAMIN C) 100 MG tablet Take 100 mg by mouth daily.    [name] aspirin 81 MG tablet Take 81 mg by mouth daily.    . Omega-3 Fatty Acids (FISH OIL) 1000 MG CAPS Take by mouth.    . predniSONE (DELTASONE) 10 MG tablet Take 3 tablets daily 90 tablet 0   No facility-administered medications prior to visit.         Objective:   Physical Exam Vitals:   09/04/16 0954  BP: 128/70  Pulse: 73  SpO2: 99%  Weight: 162 lb 12.8 oz (73.8 kg)  Height: 5\' 2"  (1.575 m)   Gen: Pleasant, overwt woman, in no distress,  normal affect  ENT: No lesions,  mouth clear,  oropharynx clear, no postnasal drip  Neck: No JVD, no TMG, no carotid bruits  Lungs: No use of accessory muscles, decrease at R base, clear without rales or rhonchi  Cardiovascular: RRR, heart sounds  normal, no murmur or gallops, no peripheral edema  Musculoskeletal: No deformities, tender to palpation of the R costal margin.   Neuro: alert, non focal  Skin: Warm, no lesions or rashes   03/29/16 --  COMPARISON:  01/19/2016 and 06/09/2014  FINDINGS: Cardiovascular: Normal caliber of the thoracic area with atherosclerotic calcifications. There are some coronary artery calcifications. Normal caliber of the main pulmonary artery.  Mediastinum/Nodes: There appears to be diffuse esophageal wall thickening but this is similar to the previous examination and nonspecific. No significant chest lymphadenopathy. Visualized thyroid tissue is unremarkable.  Lungs/Pleura: Trachea and mainstem bronchi are patent. Stable mild scarring at the lung apices. Stable old inflammatory changes in the posterior right upper lobe. Stable 6 mm irregular shaped nodule in the right upper lobe on sequence 3, image 56. Stable pleural-based linear density in the right middle lobe on image 70. There may be new subtle inflammatory changes along the posterior right middle lobe. There is a markedly increased of pleural thickening along the lateral right lower chest. Previously, there was a pleural-based nodule in this area but this is no longer visualized due to the increased thickening throughout this entire area. There is a small amount of adjacent parenchymal disease. This  lateral pleural thickening involves the right lower lobe and the right middle lobe. Previously, there was a small focus of lateral right pleural thickening measuring up to 0.9 cm in thickness but this pleural thickening now measures up to 1.5 cm with a larger extent. New subtle nodular densities in the superior segment of the left lower lobe are compatible with infectious or inflammatory changes. Tiny nodular density left upper lobe on sequence 3, image 26 may be new. This may represent new subtle inflammatory changes. There is a new  5 mm nodule in the left upper lobe on image 70. Probable atelectasis at the base of the lingula. Again noted is a tiny nodular density in the left upper lobe on image 48. There is a trace amount of dependent right pleural fluid which may be complex. There is no significant left pleural fluid.  Upper Abdomen: There is a stable 1.9 cm low-density nodule in the right adrenal gland. This is most compatible with a benign adenoma. No gross abnormality to the left adrenal gland. No acute abnormality in the upper abdomen.  Musculoskeletal: No acute bone abnormality.  IMPRESSION: Increased pleural thickening along the right lower chest compatible with postinflammatory changes. There is only a small amount of pleural fluid or thickening along the dependent aspect of the right lower chest.  Scattered postinflammatory changes throughout both lungs with tiny nodules. Some of the nodules are stable as described. There are new small foci of inflammatory changes in both lungs. Recommend a 3 month follow-up to further evaluate these inflammatory changes, nodules and increasing right pleural thickening.  Possible esophageal wall thickening which has not significantly changed. This is nonspecific and recommend clinical correlation with regards to upper GI symptoms.  Stable right adrenal nodule.  This probably represents an adenoma      Assessment & Plan:  Pleurisy with effusion Etiology unclear. I am still concerned about an autoimmune process although she had a reassuring evaluation by rheumatology. She did not respond to empiric prednisone over the last 2 weeks. I will repeat her CT scan of the chest now. Depending on the findings we will consider thoracentesis versus pleural biopsy. Ibuprofen for pain  We will repeat your CT scan of your chest to evaluate your pleural inflammation and fluid.  You may continue to take Aleve for your pain. You may want to consider changing this back to  ibuprofen 600-800mg  three times a day.  We will hold off on a stronger medication for now since they have side effects.  Depending on your Chest CT scan you may need other evaluation like pleural fluid sampling or a pleural biopsy.  Follow with Dr Lamonte Sakai next available to review your scan.   Baltazar Apo, MD, PhD 09/04/2016, 10:30 AM [facility] [phone] or if no answer [phone]

## 2016-09-10 ENCOUNTER — Ambulatory Visit (INDEPENDENT_AMBULATORY_CARE_PROVIDER_SITE_OTHER)
Admission: RE | Admit: 2016-09-10 | Discharge: 2016-09-10 | Disposition: A | Discharge status: Home / Self Care | Payer: BLUE CROSS/BLUE SHIELD | Source: Ambulatory Visit | Attending: Emergency Medicine | Admitting: Emergency Medicine

## 2016-09-10 ENCOUNTER — Ambulatory Visit (HOSPITAL_COMMUNITY): Payer: BLUE CROSS/BLUE SHIELD

## 2016-09-10 DIAGNOSIS — J90 Pleural effusion, not elsewhere classified: Secondary | ICD-10-CM | POA: Diagnosis not present

## 2016-09-10 DIAGNOSIS — R0781 Pleurodynia: Secondary | ICD-10-CM | POA: Diagnosis not present

## 2016-09-18 ENCOUNTER — Telehealth: Payer: Self-pay | Admitting: Emergency Medicine

## 2016-09-18 DIAGNOSIS — K228 Other specified diseases of esophagus: Secondary | ICD-10-CM

## 2016-09-18 DIAGNOSIS — K2289 Other specified disease of esophagus: Secondary | ICD-10-CM

## 2016-09-18 NOTE — Telephone Encounter (Signed)
D./w Dr Lamonte Sakai who feels patient needs to be seen next 2 weeks. Plesae see if [facility] can accommodate patient next week somewhere. If not, [facility] are options  Dr. Brand Males, M.D., [facility].C.P Pulmonary and Critical Care Medicine Staff Physician [facility] Pager: [phone], If no answer or between  15:00h - 7:00h: call [phone]  09/18/2016 4:26 PM

## 2016-09-18 NOTE — Telephone Encounter (Signed)
2. Would like to speak to someone in administration about concerns of mother's care and lack of communication with physician.  3. Would like CT scan results on 09/10/2016.  I made the daughter aware of our office protocol about switching physicians.  She does not know any of the other physicians and so does not have a preference.

## 2016-09-18 NOTE — Telephone Encounter (Signed)
MR  Please Advise-  Please see RB's previous message. Spoke with pt's daughter about arranging an appt with you. Your first available is not until July. She wants to make sure that her mom is ok waiting until then for an appt.   FYI: To nurse I have placed the referral for [facility]

## 2016-09-18 NOTE — Telephone Encounter (Signed)
Per Daneil Dan- ok to scheduled for 5/11 at 10 AM  They did not answer when I called, so I held the slot- prefer 30 min appt per Dane[facility], but 15 min ok if need be  LMTCB for daughter, Estill Bamberg

## 2016-09-18 NOTE — Telephone Encounter (Signed)
RB  Please Advise-   Pt's daughter is requesting CT scan results    Study Result   CLINICAL DATA:  Pleuritic chest pain, pleurisy with pleural effusion, follow-up, history of bladder cancer 6 years ago  EXAM: CT CHEST WITHOUT CONTRAST  TECHNIQUE: Multidetector CT imaging of the chest was performed following the standard protocol without IV contrast. Sagittal and coronal MPR images reconstructed from axial data set.  COMPARISON:  03/29/2016  FINDINGS: Cardiovascular: Atherosclerotic calcifications aorta and coronary arteries. Aorta normal caliber. No pericardial effusion.  Mediastinum/Nodes: Diffuse wall thickening of the mid to distal esophagus. Base of cervical region normal appearance. Scattered normal sized axillary and mediastinal lymph nodes without definite thoracic adenopathy.  Lungs/Pleura: Pleural thickening in the RIGHT hemithorax along the major fissure and laterally unchanged. 5 mm RIGHT upper lobe nodule image 54, partially calcified question granuloma, stable. Additional 6 mm nodule at RIGHT major fissure image 56 unchanged. Additional calcified granuloma RIGHT upper lobe image 45. 5 mm RIGHT lower lobe nodule laterally image 100, better visualized on the previous exam where or atelectasis was present previously. No acute infiltrate, pleural effusion or pneumothorax. No new or enlarging pulmonary mass/nodule.  Upper Abdomen: Stable low-attenuation RIGHT adrenal nodule consistent with myelolipoma. Remaining visualized upper abdomen unremarkable.  Musculoskeletal: Bones demineralized. Schmorl's node at superior endplate compression deformity of T12 vertebral body unchanged.  IMPRESSION: Stable pulmonary nodules and RIGHT pleural thickening with evidence of old granulomatous disease.  Aortic atherosclerosis and coronary artery calcification.  Diffuse wall thickening of the esophagus question esophagitis, recommend correlation with patient's  symptoms and if clinically indicated endoscopy or esophagram.

## 2016-09-18 NOTE — Telephone Encounter (Signed)
lmomtcb x1 

## 2016-09-18 NOTE — Telephone Encounter (Signed)
Please let her know that I have reviewed her CT scan of the chest. This does not show any reaccumulation of pleural fluid but she does still have thickening of the pleural lining around the right lung. The scan also shows some thickening of her esophagus which I believe needs further evaluation. I am still suspicious that she has an autoimmune inflammatory process, although I realize that rheumatology was less suspicious. She may ultimately require a pleural biopsy and / or another rheumatology appointment her CT findings and her pain.  1. She needs to be set up for a new consult with gastroenterology to evaluate her esophageal thickening 2. I have discussed her case with Dr. Chase Caller who agrees to see her for her subsequent evaluation. Please set up an office visit with Dr. Chase Caller So that the workup can be continued.

## 2016-09-19 NOTE — Telephone Encounter (Signed)
lmtcb for Amanda. 

## 2016-09-19 NOTE — Telephone Encounter (Signed)
DaughterEstill Bamberg [phone]) states Monday's are best, because patient is off of work on this day...ert

## 2016-09-19 NOTE — Telephone Encounter (Signed)
lmom tcb x2 to pt's daughter

## 2016-09-20 NOTE — Telephone Encounter (Signed)
Spoke with Shelia Berg, states that they are needing a Monday appt if at all possible. Amanda(daughter) is needing Mondays so that she can come with the patient because she translates and also is primary caregiver.  Is there anywhere that this patient can be seen?  There is a held spot on 09/24/16 at 11:45a -- Daneil Dan are you aware of what this is for?  Can this be used to schedule this patient?  Please advise. Thanks.

## 2016-09-20 NOTE — Telephone Encounter (Signed)
Yes, the hold spot on 09/24/16 is for this pt, please use. THanks.

## 2016-09-20 NOTE — Telephone Encounter (Signed)
lmtcb for Amanda. 

## 2016-09-21 NOTE — Telephone Encounter (Signed)
Spoke with Veatrice Kells is aware of appt with MR at 11:45am for her mother. Nothing more is needed at this time.

## 2016-09-24 ENCOUNTER — Ambulatory Visit (INDEPENDENT_AMBULATORY_CARE_PROVIDER_SITE_OTHER): Payer: BLUE CROSS/BLUE SHIELD | Admitting: Internal Medicine

## 2016-09-24 ENCOUNTER — Encounter: Payer: Self-pay | Admitting: Internal Medicine

## 2016-09-24 DIAGNOSIS — R0781 Pleurodynia: Secondary | ICD-10-CM | POA: Diagnosis not present

## 2016-09-24 LAB — NITRIC OXIDE: Nitric Oxide: 6

## 2016-09-24 NOTE — Patient Instructions (Signed)
Pleuritic chest pain Unclear cause especially in face of improving pleural inflammation though pleural inflammation cause cause this  Prednisone did not help. Ibuprofen only helps somewhat  PLAN For pain: would recommend Rob Balkind acupuncuture (respect desire to avoid allopathic tablets) For workup: will d/w Dr Alinda Money about getting bone scan and getting back to ou  Followup - 3 months

## 2016-09-24 NOTE — Progress Notes (Signed)
Subjective:     Patient ID: Shelia Berg, female   DOB: 01/22/1951, 66 y.o.   MRN: [id number]  HPI 66 year old former smoker (35 pk-yrs) with a history of OA, bladder CA (treated 2010). She was admitted with a RLL PNA in 05/2014. At that time had a CT Chest that showed some pulmonary nodules. She was seen 01/17/16 for right-sided pleuritic chest pain and for 4 days. Her evaluation included a chest x-ray that showed a small right pleural effusion. Large slightly on a subsequent chest x-ray from 01/19/16. CT scan of the chest from 01/19/16 was personally reviewed by me. This showed stable 5 mm right lateral chest wall pulmonary nodule, stable 4 mm right upper lobe and left lung apex nodules. Stable atelectasis in the right middle lobe. All right pleural effusion. She had an elevated d-dimer and a ventilation/perfusion scan was performed that showed low probability for pulmonary embolism (01/20/16). He was treated empirically for a possible community acquired pneumonia. A follow-up chest x-ray performed 02/13/16 and then 02/20/16 showed persistent effusion. Her chest pain at the time had persisted. She is still having focal R pleuritic pain that is worse with movement, cough.   ROV 04/11/16 -- This is a follow-up visit for patient with history tobacco use and bladder cancer. She was treated for a community-acquired pneumonia and has had a persistent right sided pleural effusion with associated pleuritic pain. We performed an ANA that was positive with a dilution of 1:640. RF was negative. We repeated her CT scan of the chest on 03/29/16 that I personally reviewed. This showed diffuse esophageal wall thickening, stable pulmonary nodules as described in the previous scan. Significant increase in right-sided pleural thickening in absence of clear effusion. She tells me that she had hoarse voice and cough about 2 weeks ago, associated with some dyspnea. Was treated with cough suppressants. She tells me that her chest pain and  her back pain are resolved. She was sent to rheumatology by her PCP - labs redrawn and pending.   ROV 08/21/16 -- pt with hx tobacco, pleuritic pain and effusion. eval revealed ANA positive (1:640), suggestive auto-immune pleurisy and effusion. She returns today c/o R mid-back, R upper chest / pectoral, worst at night and w deep breathing. She was treated for an AE COPD in February w steroids and abx. CXR 06/14/16 showed no effusion.   Acute OV 09/04/16 -- this follow-up visit for patient who had an episode of right pleuritic pain with a pleural effusion. The cup to date has revealed an elevated ANA (1:640). She was seen by Rhrumatology and was told that her eva;l was inconsistent with SLE. She is here with continued R back and flank pain, pleuritic in nature. CXR 4/11 did not show significant reaccumulation of her effusion, did show some pleural thickening. She has been on    OV 09/24/2016  Chief Complaint  Patient presents with  . Follow-up    Pt is changing from RB to MR. Pt states her breathing is unchanged since last OV. Pt states her cough is unchanged, pt c/o right sided upper back pain and upper right chest pain. Pt denies f/c/s.      Transfer of care from Dr. Baltazar Apo to Dr. Chase Caller for right-sided pleuritic chest pain  66 year old fungating female who is been an immigrant to [place], [place] from [place] over 10 years ago. Her daughter is Estill Bamberg who is here with her. Patient works as a Librarian, academic for a Teacher, music. History is gained from  talking to the patient, daughter and review of the chart. In 2016 she was admitted for community-acquired pneumonia according to the patient and the daughter and this was confirmed on review of the chart. It was a right lower lobe pneumonia with a pleuritic rind urine strep and Legionella were negative. She was then discharged. However she started developing right-sided pleuritic chest pain only in summer of 2013 and since then it  has been persistent. It is intermittent and at times severe it is definitely pleuritic in that it gets worse with deep inspiration and with movement of the chest. It is present on the right side and also in the upper chest. It is been extensively worked up by Dr. Baltazar Apo she has elevated ANA 1:640 but rheumatologic workup was negative and a prednisone trial for 3 weeks did not help the pain. Ibuprofen does help the pain but only somewhat. Patient is reluctant to take any chronic medications for the same especially opioids.. She's had CT scan of the chest in November 2017 and a follow-up in April 2018 both of which I personally visualized and showed her and her daughter. In my opinion the pleural thickening on the right middle area is slowly improving. I do not see any other bony abnormalities and there no reports of any bony fractures. She has a history of bladder cancer  Followed by Dr Alinda Money whose records I do not have but apparently has given her a clean bill of health as of 3 months ago. There is no weight loss or any other symptoms. Patient is open to trying acupuncture for relief of pain.   Results for VLADA, URIOSTEGUI (MRN [id number]) as of 09/24/2016 12:02  Ref. Range 08/21/2016 17:30  SEE BELOW Unknown Comment  Anit Nuclear Antibody(ANA) Latest Ref Range: Negative  Positive (A)  dsDNA Ab Latest Ref Range: 0 - 9 IU/mL 2  ENA RNP Ab Latest Ref Range: 0.0 - 0.9 AI <0.2  ENA SSA (RO) Ab Latest Ref Range: 0.0 - 0.9 AI <0.2  ENA SSB (LA) Ab Latest Ref Range: 0.0 - 0.9 AI 1.0 (H)  ENA SM Ab Ser-aCnc Latest Ref Range: 0.0 - 0.9 AI <0.2    VQ Scan IMPRESSION:sep 2017 1. Very low probability of pulmonary embolus by PIOPED II criteria (0 to 9% risk). Scattered ventilation defects are more striking than the small upper lobe perfusion defects and suggest the possibility of air trapping or airway thickening causing ventilation defects. Stripe sign on both ventilation and perfusion images.  These  results will be called to the ordering clinician or representative by the Radiologist Assistant, and communication documented in the PACS or zVision Dashboard.   Electronically Signed   By: Van Clines M.D.   On: 01/20/2016 20:09   IMPRESSION: ct chst April 2018 Stable pulmonary nodules and RIGHT pleural thickening with evidence of old granulomatous disease.Aortic atherosclerosis and coronary artery calcification.  Diffuse wall thickening of the esophagus question esophagitis, recommend correlation with patient's symptoms and if clinically indicated endoscopy or esophagram.   Electronically Signed   By: Lavonia Dana M.D.   On: 09/10/2016 15:09   Results for LINDALEE, HUIZINGA (MRN [id number]) as of 09/24/2016 12:02  Ref. Range 06/08/2014 05:32 06/18/2014 11:54 11/14/2014 09:36 08/03/2015 14:42 01/17/2016 13:05 01/19/2016 16:51 01/20/2016 17:29 03/29/2016 11:40  Hemoglobin Latest Ref Range: 12.2 - 16.2 g/dL 11.7 (L) 14.2 13.6 14.4 13.5 WILL FOLLOW 12.5 12.7   Results for PETER, DAQUILA (MRN [id number]) as of 09/24/2016 12:02  Ref. Range 02/21/2008 20:09  06/18/2014 11:48 11/14/2014 09:27 08/03/2015 14:25  Alkaline Phosphatase Latest Ref Range: 33 - 130 U/L 72 73 85 104    has a past medical history of Arthritis and Bladder cancer ([place]) (6 years ago).   reports that she quit smoking about 2 years ago. Her smoking use included Cigarettes. She has a 15.00 pack-year smoking history. She has never used smokeless tobacco.  Past Surgical History:  Procedure Laterality Date  . ABDOMINAL HYSTERECTOMY      Allergies  Allergen Reactions  . Contrast Media [Iodinated Diagnostic Agents] Anaphylaxis    Patient stopped breathing    Immunization History  Administered Date(s) Administered  . Influenza,inj,Quad PF,36+ Mos 02/13/2016    No family history on file.   Current Outpatient Prescriptions:  .  Ascorbic Acid (VITAMIN C) 100 MG tablet, Take 100 mg by mouth daily., Disp: , Rfl:  .  aspirin  81 MG tablet, Take 81 mg by mouth daily., Disp: , Rfl:  .  ibuprofen (ADVIL,MOTRIN) 800 MG tablet, Take 1 tablet (800 mg total) by mouth every 8 (eight) hours as needed., Disp: 90 tablet, Rfl: 0 .  Omega-3 Fatty Acids (FISH OIL) 1000 MG CAPS, Take by mouth., Disp: , Rfl:  .  albuterol (PROVENTIL HFA;VENTOLIN HFA) 108 (90 Base) MCG/ACT inhaler, Inhale 2 puffs into the lungs every 6 (six) hours as needed for wheezing or shortness of breath. (Patient not taking: Reported on 09/24/2016), Disp: 1 Inhaler, Rfl: 0    Review of Systems     Objective:   Physical Exam  Constitutional: She is oriented to person, place, and time. She appears well-developed and well-nourished. No distress.  HENT:  Head: Normocephalic and atraumatic.  Right Ear: External ear normal.  Left Ear: External ear normal.  Mouth/Throat: Oropharynx is clear and moist. No oropharyngeal exudate.  Eyes: Conjunctivae and EOM are normal. Pupils are equal, round, and reactive to light. Right eye exhibits no discharge. Left eye exhibits no discharge. No scleral icterus.  Neck: Normal range of motion. Neck supple. No JVD present. No tracheal deviation present. No thyromegaly present.  Cardiovascular: Normal rate, regular rhythm, normal heart sounds and intact distal pulses.  Exam reveals no gallop and no friction rub.   No murmur heard. Pulmonary/Chest: Effort normal and breath sounds normal. No respiratory distress. She has no wheezes. She has no rales. She exhibits no tenderness.  Abdominal: Soft. Bowel sounds are normal. She exhibits no distension and no mass. There is no tenderness. There is no rebound and no guarding.  Musculoskeletal: Normal range of motion. She exhibits no edema or tenderness.  Lymphadenopathy:    She has no cervical adenopathy.  Neurological: She is alert and oriented to person, place, and time. She has normal reflexes. No cranial nerve deficit. She exhibits normal muscle tone. Coordination normal.  Skin: Skin  is warm and dry. No rash noted. She is not diaphoretic. No erythema. No pallor.  Psychiatric: She has a normal mood and affect. Her behavior is normal. Judgment and thought content normal.  Vitals reviewed.  Vitals:   09/24/16 1150  BP: 122/64  Pulse: 79  SpO2: 97%  Weight: 165 lb 9.6 oz (75.1 kg)  Height: 5\' 2"  (1.575 m)    Estimated body mass index is 30.29 kg/m as calculated from the following:   Height as of this encounter: 5\' 2"  (1.575 m).   Weight as of this encounter: 165 lb 9.6 oz (75.1 kg).     Assessment:       ICD-9-CM ICD-10-CM  1. Pleuritic chest pain 786.52 R07.81        Plan:     Pleuritic chest pain Unclear cause especially in face of improving pleural inflammation though pleural inflammation cause cause this  Prednisone did not help. Ibuprofen only helps somewhat  PLAN For pain: would recommend Rob Balkind acupuncuture (respect desire to avoid allopathic tablets) For workup: will d/w Dr Alinda Money about getting bone scan and getting back to ou  Followup - 3 months    > 50% of this > 25 min visit spent in face to face counseling or coordination of care    Dr. Brand Males, M.D., [facility].C.P Pulmonary and Critical Care Medicine Staff Physician [place] [facility] Pager: [phone], If no answer or between  15:00h - 7:00h: call [phone]  09/24/2016 12:33 PM

## 2016-09-24 NOTE — Assessment & Plan Note (Signed)
Unclear cause especially in face of improving pleural inflammation though pleural inflammation cause cause this  Prednisone did not help. Ibuprofen only helps somewhat  PLAN For pain: would recommend Rob Balkind acupuncuture (respect desire to avoid allopathic tablets) For workup: will d/w Dr Alinda Money about getting bone scan and getting back to ou  Followup - 3 months

## 2016-09-25 ENCOUNTER — Telehealth: Payer: Self-pay | Admitting: Internal Medicine

## 2016-09-25 DIAGNOSIS — Z8551 Personal history of malignant neoplasm of bladder: Secondary | ICD-10-CM

## 2016-09-25 DIAGNOSIS — R0781 Pleurodynia: Secondary | ICD-10-CM

## 2016-09-25 NOTE — Telephone Encounter (Signed)
Patient returning call - she would like for [place] to call back her daughter Estill Bamberg at [phone] -pr

## 2016-09-25 NOTE — Telephone Encounter (Signed)
lmtcb X1 for pt.   Routing to elise for follow up.

## 2016-09-25 NOTE — Telephone Encounter (Signed)
lmtcb for Shelia Berg. 

## 2016-09-25 NOTE — Telephone Encounter (Signed)
Elise/triage  Let Theodosia Quay or daughter Estill Bamberg know 09/25/2016 or 09/26/16 that I ddid d/w Dr Raynelle Bring and he did confirm bladder cancer under remission and there is very little chance it could have spread to the rib cage. Nevertheless, I think with her right sided pain is best to get a bone scan. Please order one and then when results flow in I will advise of next step. Please let me know when bone scan is  THanks  Dr. Brand Males, M.D., [facility].C.P Pulmonary and Critical Care Medicine Staff Physician [place] [facility] Pager: [phone], If no answer or between  15:00h - 7:00h: call [phone]  09/25/2016 12:53 PM

## 2016-09-26 NOTE — Telephone Encounter (Signed)
It is a nuclear medicine bone scan  - not a bone density scan. It is abone scan to look for bony mets. Please check with radiology first  Please let daughter know that I will be talking to Kara Dies  Dr. Brand Males, M.D., [facility].C.P Pulmonary and Critical Care Medicine Staff Physician [place] [facility] Pager: [phone], If no answer or between  15:00h - 7:00h: call [phone]  09/26/2016 1:53 PM

## 2016-09-26 NOTE — Telephone Encounter (Signed)
Called and spoke to [place]. Informed her of the recs per MR. Estill Bamberg verbalized understanding and also states pt is scheduled for an acupuncture appt for Monday 5/21.   MR please advise if this is a CXR order or bone density scan. Thanks.

## 2016-09-26 NOTE — Telephone Encounter (Signed)
Called [facility] nuclear medicine dept, order must be placed as NM bone scan whole body.  Order has been placed.   lmtcb X1 for pt's daughter to relay MR's further info.

## 2016-09-26 NOTE — Telephone Encounter (Signed)
Appointment has been made at [facility] for 10/04/16 arrive at 11:45am for this test they will get and injection then have to wait 3 hours then the scan is an hour long.  They will check in at the main entrance. The number for the dept if she needs to resc is [phone]. I have not called the patient since the nurse is waiting on a call from the daughter now.

## 2016-09-28 NOTE — Telephone Encounter (Signed)
lmtcb for pt.  

## 2016-10-01 ENCOUNTER — Telehealth: Payer: Self-pay | Admitting: *Deleted

## 2016-10-01 NOTE — Telephone Encounter (Signed)
[facility] Dr Dorna Leitz Thursday 10/04/16 at [place] time is [place] Phone: [phone]

## 2016-10-04 ENCOUNTER — Ambulatory Visit (HOSPITAL_COMMUNITY): Payer: BLUE CROSS/BLUE SHIELD

## 2016-10-04 DIAGNOSIS — M1711 Unilateral primary osteoarthritis, right knee: Secondary | ICD-10-CM | POA: Diagnosis not present

## 2016-10-04 DIAGNOSIS — M1712 Unilateral primary osteoarthritis, left knee: Secondary | ICD-10-CM | POA: Diagnosis not present

## 2016-10-05 NOTE — Telephone Encounter (Signed)
Per pt's chart, the 5.24.18 appt was cancelled and rescheduled for 6.6.18 Will sign and forward to MR as FYI of rescheduled date

## 2016-10-11 ENCOUNTER — Other Ambulatory Visit: Payer: Self-pay | Admitting: Emergency Medicine

## 2016-10-17 ENCOUNTER — Encounter (HOSPITAL_COMMUNITY)
Admission: RE | Admit: 2016-10-17 | Discharge: 2016-10-17 | Disposition: A | Discharge status: Home / Self Care | Payer: Medicare Other | Source: Ambulatory Visit | Attending: Internal Medicine | Admitting: Internal Medicine

## 2016-10-17 ENCOUNTER — Telehealth: Payer: Self-pay | Admitting: Internal Medicine

## 2016-10-17 DIAGNOSIS — Z8551 Personal history of malignant neoplasm of bladder: Secondary | ICD-10-CM | POA: Diagnosis not present

## 2016-10-17 DIAGNOSIS — R0781 Pleurodynia: Secondary | ICD-10-CM | POA: Diagnosis not present

## 2016-10-17 DIAGNOSIS — R079 Chest pain, unspecified: Secondary | ICD-10-CM | POA: Diagnosis not present

## 2016-10-17 MED ORDER — TECHNETIUM TC 99M MEDRONATE IV KIT
21.0000 | PACK | Freq: Once | INTRAVENOUS | Status: AC | PRN
  Administered 2016-10-17: 21 via INTRAVENOUS

## 2016-10-17 NOTE — Telephone Encounter (Signed)
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 Otherwise normal  Dr. Brand Males, M.D., [facility].C.P Pulmonary and Critical Care Medicine Staff Physician [place] [facility] Pager: [phone], If no answer or between  15:00h - 7:00h: call [phone]  10/17/2016 11:35 PM    N[facility] Bone Scan Whole Body  Result Date: 10/17/2016 CLINICAL DATA:  History of bladder malignancy. The patient reports back and chest pain for the past year. EXAM: NUCLEAR MEDICINE WHOLE BODY BONE SCAN TECHNIQUE: Whole body anterior and posterior images were obtained approximately 3 hours after intravenous injection of radiopharmaceutical. RADIOPHARMACEUTICALS:  Twenty-one mCi Technetium-17m MDP IV COMPARISON:  None in PACs FINDINGS: There is adequate uptake of the radiopharmaceutical by the skeleton. There is adequate soft tissue clearance and renal activity. Uptake within the calvarium, spine, and pelvis is normal. Uptake within ribs and pectoral girdle is normal. There is increased uptake associated with both knees greatest on the right involving the medial compartment compatible with degenerative change. IMPRESSION: There are no findings suspicious for metastatic disease to the skeleton. No significant arthritic type activity is observed outside of the knees. Electronically Signed   By: David  Martinique M.D.   On: 10/17/2016 15:41

## 2016-10-18 NOTE — Telephone Encounter (Signed)
Called and spoke to pt's daughter, Estill Bamberg. Informed her of the results. Estill Bamberg verbalized understanding and also states the pt has had 3 rounds of acupuncture with no improvement in the pain. Estill Bamberg is requesting next steps.   MR please advise. Thanks.

## 2016-10-19 NOTE — Telephone Encounter (Signed)
I am at a total loss regarding pain. We can do Thoracic and C spine MRI to see if is a spine issue. I can look into that next but am running out of other ideas for cause and Rx. If they are interested we can order next week  Dr. Brand Males, M.D., [facility].C.P Pulmonary and Critical Care Medicine Staff Physician [place] [facility] Pager: [phone], If no answer or between  15:00h - 7:00h: call [phone]  10/19/2016 3:27 PM

## 2016-10-19 NOTE — Telephone Encounter (Signed)
lmtcb x1 for pt's daughter. 

## 2016-10-25 NOTE — Telephone Encounter (Signed)
Daughter Estill Bamberg) returned phone call, contact # [phone].Marland Kitchenert

## 2016-10-25 NOTE — Telephone Encounter (Signed)
Spoke with pt's daughter, Estill Bamberg. She is aware of MR's response. States that she doesn't want the pt to have another MRI. She is wondering if she should see GI?  MR - please advise. Thanks.

## 2016-11-01 NOTE — Telephone Encounter (Signed)
I am not sure GI will see for lateral pain. THey need to have a complaint like acid reflux or stromach pain or even right upper quadrant pain. If I reember right this pain was right infra axillary but if is now extending to RUQ we can try GI referral. Or if they are very keen on GI referral: I have to call GI doc personally and try  Dr. Brand Males, M.D., [facility].C.P Pulmonary and Critical Care Medicine Staff Physician [place] [facility] Pager: [phone], If no answer or between  15:00h - 7:00h: call [phone]  11/01/2016 9:22 AM

## 2016-11-01 NOTE — Telephone Encounter (Signed)
Called spoke with patient's daughter Estill Bamberg to discus MR's recommendations Estill Bamberg became agitated and would not allow me to complete MR's recs stating that pt reports she is not getting any better - "someone needs to figure out what's going on" - "there's no way no one can't figure out where this pain is coming from" - "if something happens to my mom, it's not going to be good for anyone."  The pain has not extended into the RUQ and Estill Bamberg does not want a referral to GI.  MR please advise on your next recommendation.

## 2016-11-02 NOTE — Telephone Encounter (Signed)
lmtcb x1 for pt's daughter, Estill Bamberg.

## 2016-11-02 NOTE — Telephone Encounter (Signed)
I feel frustrated that I am unable to help. I can understand their frustraton. Please see if they can come 11/15/16 for a face to face?  Thanks  Dr. Brand Males, M.D., [facility].C.P Pulmonary and Critical Care Medicine Staff Physician [place] [facility] Pager: [phone], If no answer or between  15:00h - 7:00h: call [phone]  11/02/2016 12:33 PM

## 2016-11-06 DIAGNOSIS — M25562 Pain in left knee: Secondary | ICD-10-CM | POA: Diagnosis not present

## 2016-11-06 DIAGNOSIS — M25561 Pain in right knee: Secondary | ICD-10-CM | POA: Diagnosis not present

## 2016-11-06 DIAGNOSIS — M1712 Unilateral primary osteoarthritis, left knee: Secondary | ICD-10-CM | POA: Diagnosis not present

## 2016-11-06 DIAGNOSIS — M1711 Unilateral primary osteoarthritis, right knee: Secondary | ICD-10-CM | POA: Diagnosis not present

## 2016-12-20 ENCOUNTER — Ambulatory Visit (INDEPENDENT_AMBULATORY_CARE_PROVIDER_SITE_OTHER): Payer: Medicare Other | Admitting: Emergency Medicine

## 2016-12-20 ENCOUNTER — Encounter: Payer: Self-pay | Admitting: Emergency Medicine

## 2016-12-20 VITALS — BP 138/82 | HR 85 | Temp 98.0°F | Resp 18 | Ht 62.21 in | Wt 167.0 lb

## 2016-12-20 DIAGNOSIS — R05 Cough: Secondary | ICD-10-CM | POA: Diagnosis not present

## 2016-12-20 DIAGNOSIS — J209 Acute bronchitis, unspecified: Secondary | ICD-10-CM | POA: Diagnosis not present

## 2016-12-20 DIAGNOSIS — R059 Cough, unspecified: Secondary | ICD-10-CM | POA: Insufficient documentation

## 2016-12-20 MED ORDER — AZITHROMYCIN 250 MG PO TABS: ORAL_TABLET | ORAL | 0 refills | Status: DC

## 2016-12-20 MED ORDER — PROMETHAZINE-CODEINE 6.25-10 MG/5ML PO SYRP: 5.0000 mL | ORAL_SOLUTION | Freq: Every evening | ORAL | 0 refills | Status: DC | PRN

## 2016-12-20 NOTE — Patient Instructions (Addendum)
     IF you received an x-ray today, you will receive an invoice from [facility]. Please contact [facility] at [phone] with questions or concerns regarding your invoice.   IF you received labwork today, you will receive an invoice from [facility]. Please contact [facility] at [phone] with questions or concerns regarding your invoice.   Our billing staff will not be able to assist you with questions regarding bills from these companies.  You will be contacted with the lab results as soon as they are available. The fastest way to get your results is to activate your My Chart account. Instructions are located on the last page of this paperwork. If you have not heard from us regarding the results in 2 weeks, please contact this office.      Acute Bronchitis, Adult Acute bronchitis is when air tubes (bronchi) in the lungs suddenly get swollen. The condition can make it hard to breathe. It can also cause these symptoms:  A cough.  Coughing up clear, yellow, or green mucus.  Wheezing.  Chest congestion.  Shortness of breath.  A fever.  Body aches.  Chills.  A sore throat.  Follow these instructions at home: Medicines  Take over-the-counter and prescription medicines only as told by your doctor.  If you were prescribed an antibiotic medicine, take it as told by your doctor. Do not stop taking the antibiotic even if you start to feel better. General instructions  Rest.  Drink enough fluids to keep your pee (urine) clear or pale yellow.  Avoid smoking and secondhand smoke. If you smoke and you need help quitting, ask your doctor. Quitting will help your lungs heal faster.  Use an inhaler, cool mist vaporizer, or humidifier as told by your doctor.  Keep all follow-up visits as told by your doctor. This is important. How is this prevented? To lower your risk of getting this condition again:  Wash your hands often with soap and water. If you cannot  use soap and water, use hand sanitizer.  Avoid contact with people who have cold symptoms.  Try not to touch your hands to your mouth, nose, or eyes.  Make sure to get the flu shot every year.  Contact a doctor if:  Your symptoms do not get better in 2 weeks. Get help right away if:  You cough up blood.  You have chest pain.  You have very bad shortness of breath.  You become dehydrated.  You faint (pass out) or keep feeling like you are going to pass out.  You keep throwing up (vomiting).  You have a very bad headache.  Your fever or chills gets worse. This information is not intended to replace advice given to you by your health care provider. Make sure you discuss any questions you have with your health care provider. Document Released: 10/17/2007 Document Revised: 12/07/2015 Document Reviewed: 10/19/2015 Elsevier Interactive Patient Education  2017 [employer]  

## 2016-12-20 NOTE — Progress Notes (Signed)
Shelia Berg 66 y.o.   Chief Complaint  Patient presents with  . Cough    productive cough with some back pain     HISTORY OF PRESENT ILLNESS: This is a 66 y.o. female complaining of cough x 4-5 days; husband also sick.  Cough  This is a new problem. The current episode started in the past 7 days. The problem has been gradually worsening. The problem occurs every few minutes. The cough is productive of sputum. Associated symptoms include chest pain, headaches, myalgias, nasal congestion and a sore throat. Pertinent negatives include no chills, fever, hemoptysis, rash or shortness of breath. She has tried OTC cough suppressant for the symptoms. The treatment provided no relief.     Prior to Admission medications   Medication Sig Start Date End Date Taking? Authorizing Provider  albuterol (PROVENTIL HFA;VENTOLIN HFA) 108 (90 Base) MCG/ACT inhaler Inhale 2 puffs into the lungs every 6 (six) hours as needed for wheezing or shortness of breath. 06/14/16  Yes Alveda Reasons, MD  Ascorbic Acid (VITAMIN C) 100 MG tablet Take 100 mg by mouth daily.   Yes [provider]  aspirin 81 MG tablet Take 81 mg by mouth daily.   Yes [provider]  ibuprofen (ADVIL,MOTRIN) 800 MG tablet TAKE 1 TABLET (800 MG TOTAL) BY MOUTH EVERY 8 (EIGHT) HOURS AS NEEDED. 10/12/16  Yes Byrum, Rose Fillers, MD  Omega-3 Fatty Acids (FISH OIL) 1000 MG CAPS Take by mouth.   Yes [provider]    Allergies  Allergen Reactions  . Contrast Media [Iodinated Diagnostic Agents] Anaphylaxis    Patient stopped breathing    Patient Active Problem List   Diagnosis Date Noted  . Pleuritic chest pain 03/05/2016  . Pleurisy with effusion 03/05/2016  . Tobacco dependence in remission 03/05/2016  . Hyperkalemia 08/04/2015  . Hypokalemia 06/07/2014  . Osteoarthritis 10/03/2006  . FRACTURE, WRIST, LEFT 05/20/2001  . HX, PERSONAL, VENOUS THROMBOSIS/EMBOLISM 05/15/1987  . TOTAL ABDOMINAL HYSTERECTOMY, HX  OF 05/14/1986    Past Medical History:  Diagnosis Date  . Arthritis   . Bladder cancer ([place]) 6 years ago    Past Surgical History:  Procedure Laterality Date  . ABDOMINAL HYSTERECTOMY      Social History   Social History  . Marital status: Married    Spouse name: N/A  . Number of children: N/A  . Years of education: N/A   Occupational History  . Not on file.   Social History Main Topics  . Smoking status: Former Smoker    Packs/day: 0.50    Years: 30.00    Types: Cigarettes    Quit date: 06/05/2014  . Smokeless tobacco: Never Used  . Alcohol use No  . Drug use: No  . Sexual activity: Yes   Other Topics Concern  . Not on file   Social History Narrative  . No narrative on file    History reviewed. No pertinent family history.   Review of Systems  Constitutional: Negative for chills and fever.  HENT: Positive for sore throat. Negative for sinus pain.   Eyes: Negative.   Respiratory: Positive for cough. Negative for hemoptysis and shortness of breath.   Cardiovascular: Positive for chest pain. Negative for palpitations.  Gastrointestinal: Negative.  Negative for abdominal pain, nausea and vomiting.  Genitourinary: Negative.   Musculoskeletal: Positive for myalgias.  Skin: Negative for rash.  Neurological: Positive for headaches.  Endo/Heme/Allergies: Negative.   All other systems reviewed and are negative.    Vitals:  12/20/16 1529  BP: 138/82  Pulse: 85  Resp: 18  Temp: 98 F (36.7 C)  SpO2: 96%     Physical Exam  Constitutional: She is oriented to person, place, and time. She appears well-developed and well-nourished.  HENT:  Head: Normocephalic and atraumatic.  Right Ear: External ear normal.  Left Ear: External ear normal.  Nose: Nose normal.  Mouth/Throat: Oropharynx is clear and moist.  Eyes: Pupils are equal, round, and reactive to light. Conjunctivae and EOM are normal.  Neck: Normal range of motion. Neck supple. No JVD present. No  thyromegaly present.  Cardiovascular: Normal rate, regular rhythm, normal heart sounds and intact distal pulses.   Pulmonary/Chest: Effort normal and breath sounds normal.  Abdominal: Soft. Bowel sounds are normal. She exhibits no distension. There is no tenderness.  Musculoskeletal: Normal range of motion.  Lymphadenopathy:    She has no cervical adenopathy.  Neurological: She is alert and oriented to person, place, and time. No sensory deficit. She exhibits normal muscle tone.  Skin: Skin is warm and dry. Capillary refill takes less than 2 seconds. No rash noted.  Psychiatric: She has a normal mood and affect. Her behavior is normal.  Vitals reviewed.    ASSESSMENT & PLAN: [place] was seen today for cough.  Diagnoses and all orders for this visit:  Acute bronchitis, unspecified organism  Cough  Other orders -     azithromycin (ZITHROMAX) 250 MG tablet; Sig as indicated -     promethazine-codeine (PHENERGAN WITH CODEINE) 6.25-10 MG/5ML syrup; Take 5 mLs by mouth at bedtime as needed for cough.    Patient Instructions       IF you received an x-ray today, you will receive an invoice from [facility]. Please contact [facility] at [phone] with questions or concerns regarding your invoice.   IF you received labwork today, you will receive an invoice from [facility]. Please contact [facility] at [phone] with questions or concerns regarding your invoice.   Our billing staff will not be able to assist you with questions regarding bills from these companies.  You will be contacted with the lab results as soon as they are available. The fastest way to get your results is to activate your My Chart account. Instructions are located on the last page of this paperwork. If you have not heard from [place] regarding the results in 2 weeks, please contact this office.      Acute Bronchitis, Adult Acute bronchitis is when air tubes (bronchi) in the lungs suddenly get  swollen. The condition can make it hard to breathe. It can also cause these symptoms:  A cough.  Coughing up clear, yellow, or green mucus.  Wheezing.  Chest congestion.  Shortness of breath.  A fever.  Body aches.  Chills.  A sore throat.  Follow these instructions at home: Medicines  Take over-the-counter and prescription medicines only as told by your doctor.  If you were prescribed an antibiotic medicine, take it as told by your doctor. Do not stop taking the antibiotic even if you start to feel better. General instructions  Rest.  Drink enough fluids to keep your pee (urine) clear or pale yellow.  Avoid smoking and secondhand smoke. If you smoke and you need help quitting, ask your doctor. Quitting will help your lungs heal faster.  Use an inhaler, cool mist vaporizer, or humidifier as told by your doctor.  Keep all follow-up visits as told by your doctor. This is important. How is this prevented? To  lower your risk of getting this condition again:  Wash your hands often with soap and water. If you cannot use soap and water, use hand sanitizer.  Avoid contact with people who have cold symptoms.  Try not to touch your hands to your mouth, nose, or eyes.  Make sure to get the flu shot every year.  Contact a doctor if:  Your symptoms do not get better in 2 weeks. Get help right away if:  You cough up blood.  You have chest pain.  You have very bad shortness of breath.  You become dehydrated.  You faint (pass out) or keep feeling like you are going to pass out.  You keep throwing up (vomiting).  You have a very bad headache.  Your fever or chills gets worse. This information is not intended to replace advice given to you by your health care provider. Make sure you discuss any questions you have with your health care provider. Document Released: 10/17/2007 Document Revised: 12/07/2015 Document Reviewed: 10/19/2015 Elsevier Interactive Patient  Education  2017 Elsevier Inc.      Agustina Caroli, MD [facility]

## 2016-12-31 ENCOUNTER — Ambulatory Visit: Payer: BLUE CROSS/BLUE SHIELD | Admitting: Internal Medicine

## 2017-01-01 ENCOUNTER — Telehealth: Payer: Self-pay | Admitting: Emergency Medicine
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
## 2017-01-01 NOTE — Telephone Encounter (Signed)
Needs to go to ER

## 2017-01-21 ENCOUNTER — Other Ambulatory Visit: Payer: Self-pay | Admitting: Emergency Medicine

## 2017-01-22 ENCOUNTER — Telehealth: Payer: Self-pay | Admitting: Internal Medicine

## 2017-01-22 NOTE — Telephone Encounter (Signed)
Left message for pt to call back. Rx was sent in this morning for Ibuprofen to her pharmacy she listed on previous call.

## 2017-01-23 NOTE — Telephone Encounter (Signed)
Called pharmacy, states that rx was picked up yesterday by patient.  Will close encounter.

## 2017-02-14 ENCOUNTER — Ambulatory Visit (INDEPENDENT_AMBULATORY_CARE_PROVIDER_SITE_OTHER): Payer: Medicare Other

## 2017-02-14 ENCOUNTER — Ambulatory Visit (INDEPENDENT_AMBULATORY_CARE_PROVIDER_SITE_OTHER): Payer: Medicare Other | Admitting: Urgent Care

## 2017-02-14 ENCOUNTER — Encounter: Payer: Self-pay | Admitting: Urgent Care

## 2017-02-14 DIAGNOSIS — M79641 Pain in right hand: Secondary | ICD-10-CM

## 2017-02-14 DIAGNOSIS — M542 Cervicalgia: Secondary | ICD-10-CM | POA: Diagnosis not present

## 2017-02-14 DIAGNOSIS — S60221A Contusion of right hand, initial encounter: Secondary | ICD-10-CM

## 2017-02-14 DIAGNOSIS — M25562 Pain in left knee: Secondary | ICD-10-CM | POA: Diagnosis not present

## 2017-02-14 DIAGNOSIS — S20211A Contusion of right front wall of thorax, initial encounter: Secondary | ICD-10-CM | POA: Diagnosis not present

## 2017-02-14 DIAGNOSIS — S8992XA Unspecified injury of left lower leg, initial encounter: Secondary | ICD-10-CM | POA: Diagnosis not present

## 2017-02-14 DIAGNOSIS — S6991XA Unspecified injury of right wrist, hand and finger(s), initial encounter: Secondary | ICD-10-CM | POA: Diagnosis not present

## 2017-02-14 MED ORDER — CYCLOBENZAPRINE HCL 5 MG PO TABS: 5.0000 mg | ORAL_TABLET | Freq: Three times a day (TID) | ORAL | 1 refills | Status: DC | PRN

## 2017-02-14 MED ORDER — MELOXICAM 7.5 MG PO TABS: 7.5000 mg | ORAL_TABLET | Freq: Every day | ORAL | 0 refills | Status: DC

## 2017-02-14 MED ORDER — KETOROLAC TROMETHAMINE 30 MG/ML IJ SOLN
30.0000 mg | Freq: Once | INTRAMUSCULAR | Status: AC
  Administered 2017-02-14: 30 mg via INTRAMUSCULAR
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
## 2017-02-16 ENCOUNTER — Emergency Department (HOSPITAL_COMMUNITY)
Admission: EM | Admit: 2017-02-16 | Discharge: 2017-02-16 | Disposition: A | Discharge status: Home / Self Care | Payer: Medicare Other | Attending: Emergency Medicine | Admitting: Emergency Medicine

## 2017-02-16 ENCOUNTER — Encounter (HOSPITAL_COMMUNITY): Payer: Self-pay | Admitting: *Deleted

## 2017-02-16 ENCOUNTER — Emergency Department (HOSPITAL_COMMUNITY): Payer: Medicare Other

## 2017-02-16 DIAGNOSIS — Z87891 Personal history of nicotine dependence: Secondary | ICD-10-CM | POA: Insufficient documentation

## 2017-02-16 DIAGNOSIS — J181 Lobar pneumonia, unspecified organism: Secondary | ICD-10-CM | POA: Diagnosis not present

## 2017-02-16 DIAGNOSIS — R079 Chest pain, unspecified: Secondary | ICD-10-CM | POA: Diagnosis not present

## 2017-02-16 DIAGNOSIS — Z79899 Other long term (current) drug therapy: Secondary | ICD-10-CM | POA: Diagnosis not present

## 2017-02-16 DIAGNOSIS — J189 Pneumonia, unspecified organism: Secondary | ICD-10-CM

## 2017-02-16 LAB — I-STAT CG4 LACTIC ACID, ED
LACTIC ACID, VENOUS: 1.77 mmol/L (ref 0.5–1.9)
Lactic Acid, Venous: 0.46 mmol/L — ABNORMAL LOW (ref 0.5–1.9)

## 2017-02-16 LAB — BASIC METABOLIC PANEL
Anion gap: 10 (ref 5–15)
BUN: 12 mg/dL (ref 6–20)
CO2: 23 mmol/L (ref 22–32)
Calcium: 8.1 mg/dL — ABNORMAL LOW (ref 8.9–10.3)
Chloride: 99 mmol/L — ABNORMAL LOW (ref 101–111)
Creatinine, Ser: 0.9 mg/dL (ref 0.44–1.00)
GFR calc Af Amer: >60 mL/min (ref >60)
GLUCOSE: 132 mg/dL — AB (ref 65–99)
POTASSIUM: 3.9 mmol/L (ref 3.5–5.1)
Sodium: 132 mmol/L — ABNORMAL LOW (ref 135–145)

## 2017-02-16 LAB — CBC
HEMATOCRIT: 36.5 % (ref 36.0–46.0)
Hemoglobin: 11.4 g/dL — ABNORMAL LOW (ref 12.0–15.0)
MCH: 26.8 pg (ref 26.0–34.0)
MCHC: 31.2 g/dL (ref 30.0–36.0)
MCV: 85.7 fL (ref 78.0–100.0)
Platelets: 259 10*3/uL (ref 150–400)
RBC: 4.26 MIL/uL (ref 3.87–5.11)
RDW: 13.5 % (ref 11.5–15.5)
WBC: 14.8 10*3/uL — ABNORMAL HIGH (ref 4.0–10.5)

## 2017-02-16 LAB — TROPONIN I: Troponin I: <0.03 ng/mL (ref <0.03)

## 2017-02-16 MED ORDER — CEPHALEXIN 500 MG PO CAPS: 500.0000 mg | ORAL_CAPSULE | Freq: Four times a day (QID) | ORAL | 0 refills | Status: DC

## 2017-02-16 MED ORDER — AZITHROMYCIN 250 MG PO TABS: 250.0000 mg | ORAL_TABLET | Freq: Every day | ORAL | 0 refills | Status: DC

## 2017-02-16 MED ORDER — DEXTROSE 5 % IV SOLN
500.0000 mg | Freq: Once | INTRAVENOUS | Status: AC
  Administered 2017-02-16: 500 mg via INTRAVENOUS
  Filled 2017-02-16: qty 500

## 2017-02-16 MED ORDER — DEXTROSE 5 % IV SOLN
1.0000 g | Freq: Once | INTRAVENOUS | Status: AC
  Administered 2017-02-16: 1 g via INTRAVENOUS
  Filled 2017-02-16: qty 10

## 2017-02-16 MED ORDER — ACETAMINOPHEN 325 MG PO TABS
650.0000 mg | ORAL_TABLET | Freq: Once | ORAL | Status: AC
  Administered 2017-02-16: 650 mg via ORAL
  Filled 2017-02-16: qty 2

## 2017-02-16 MED ORDER — FENTANYL CITRATE (PF) 100 MCG/2ML IJ SOLN
50.0000 ug | Freq: Once | INTRAMUSCULAR | Status: AC
  Administered 2017-02-16: 50 ug via INTRAVENOUS
  Filled 2017-02-16: qty 2

## 2017-02-16 MED ORDER — SODIUM CHLORIDE 0.9 % IV BOLUS (SEPSIS)
1000.0000 mL | Freq: Once | INTRAVENOUS | Status: AC
Start: 2017-02-16 — End: 2017-02-16
  Administered 2017-02-16: 1000 mL via INTRAVENOUS

## 2017-02-16 MED ORDER — ACETAMINOPHEN 325 MG PO TABS: 650.0000 mg | ORAL_TABLET | Freq: Four times a day (QID) | ORAL | 0 refills | Status: DC | PRN

## 2017-02-16 NOTE — ED Notes (Signed)
Pt ambulated down hall with Spo2 monitoring. Pt ambulated with steady gait NAD noted. Pts Sp02 maintained at a 95% on room air. Pt returned to bed.

## 2017-02-16 NOTE — ED Notes (Signed)
PT states understanding of care given, follow up care, and medication prescribed. PT ambulated from ED to car with a steady gait. 
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
## 2017-02-16 NOTE — Discharge Instructions (Signed)
Someone will call you to help set up a primary care doctor appointment. Please follow up in 2-3 weeks to get repeat X-Ray to ensure resolution of symptoms.
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
## 2017-02-18 DIAGNOSIS — I519 Heart disease, unspecified: Secondary | ICD-10-CM | POA: Diagnosis not present

## 2017-02-18 DIAGNOSIS — J189 Pneumonia, unspecified organism: Secondary | ICD-10-CM | POA: Diagnosis not present

## 2017-02-18 DIAGNOSIS — R918 Other nonspecific abnormal finding of lung field: Secondary | ICD-10-CM | POA: Diagnosis not present

## 2017-02-18 DIAGNOSIS — K2970 Gastritis, unspecified, without bleeding: Secondary | ICD-10-CM | POA: Diagnosis not present

## 2017-02-18 DIAGNOSIS — R0600 Dyspnea, unspecified: Secondary | ICD-10-CM | POA: Diagnosis not present

## 2017-02-18 DIAGNOSIS — Z87891 Personal history of nicotine dependence: Secondary | ICD-10-CM | POA: Diagnosis not present

## 2017-02-18 DIAGNOSIS — R071 Chest pain on breathing: Secondary | ICD-10-CM | POA: Diagnosis not present

## 2017-03-04 DIAGNOSIS — I071 Rheumatic tricuspid insufficiency: Secondary | ICD-10-CM | POA: Diagnosis not present

## 2017-03-04 DIAGNOSIS — R0602 Shortness of breath: Secondary | ICD-10-CM | POA: Diagnosis not present

## 2017-03-04 DIAGNOSIS — I340 Nonrheumatic mitral (valve) insufficiency: Secondary | ICD-10-CM | POA: Diagnosis not present

## 2017-03-04 DIAGNOSIS — R071 Chest pain on breathing: Secondary | ICD-10-CM | POA: Diagnosis not present

## 2017-03-04 DIAGNOSIS — R918 Other nonspecific abnormal finding of lung field: Secondary | ICD-10-CM | POA: Diagnosis not present

## 2017-03-04 DIAGNOSIS — J988 Other specified respiratory disorders: Secondary | ICD-10-CM | POA: Diagnosis not present

## 2017-03-04 DIAGNOSIS — I272 Pulmonary hypertension, unspecified: Secondary | ICD-10-CM | POA: Diagnosis not present

## 2017-05-20 DIAGNOSIS — R918 Other nonspecific abnormal finding of lung field: Secondary | ICD-10-CM | POA: Diagnosis not present

## 2017-05-20 DIAGNOSIS — R071 Chest pain on breathing: Secondary | ICD-10-CM | POA: Diagnosis not present

## 2017-05-27 DIAGNOSIS — R9389 Abnormal findings on diagnostic imaging of other specified body structures: Secondary | ICD-10-CM | POA: Diagnosis not present

## 2017-05-27 DIAGNOSIS — R071 Chest pain on breathing: Secondary | ICD-10-CM | POA: Diagnosis not present

## 2017-05-27 DIAGNOSIS — R918 Other nonspecific abnormal finding of lung field: Secondary | ICD-10-CM | POA: Diagnosis not present

## 2017-05-27 DIAGNOSIS — M792 Neuralgia and neuritis, unspecified: Secondary | ICD-10-CM | POA: Diagnosis not present

## 2017-08-14 DIAGNOSIS — C678 Malignant neoplasm of overlapping sites of bladder: Secondary | ICD-10-CM | POA: Diagnosis not present

## 2017-08-15 DIAGNOSIS — M19071 Primary osteoarthritis, right ankle and foot: Secondary | ICD-10-CM | POA: Diagnosis not present

## 2017-08-31 ENCOUNTER — Other Ambulatory Visit: Payer: Self-pay

## 2017-08-31 ENCOUNTER — Encounter (HOSPITAL_COMMUNITY): Payer: Self-pay | Admitting: Emergency Medicine

## 2017-08-31 ENCOUNTER — Emergency Department (HOSPITAL_COMMUNITY)
Admission: EM | Admit: 2017-08-31 | Discharge: 2017-08-31 | Disposition: A | Discharge status: Home / Self Care | Payer: Medicare Other | Attending: Emergency Medicine | Admitting: Emergency Medicine

## 2017-08-31 ENCOUNTER — Emergency Department (HOSPITAL_COMMUNITY): Payer: Medicare Other

## 2017-08-31 DIAGNOSIS — Z8551 Personal history of malignant neoplasm of bladder: Secondary | ICD-10-CM | POA: Diagnosis not present

## 2017-08-31 DIAGNOSIS — Z79899 Other long term (current) drug therapy: Secondary | ICD-10-CM | POA: Diagnosis not present

## 2017-08-31 DIAGNOSIS — J168 Pneumonia due to other specified infectious organisms: Secondary | ICD-10-CM | POA: Diagnosis not present

## 2017-08-31 DIAGNOSIS — R05 Cough: Secondary | ICD-10-CM | POA: Diagnosis not present

## 2017-08-31 DIAGNOSIS — J181 Lobar pneumonia, unspecified organism: Secondary | ICD-10-CM | POA: Diagnosis not present

## 2017-08-31 DIAGNOSIS — J189 Pneumonia, unspecified organism: Secondary | ICD-10-CM

## 2017-08-31 DIAGNOSIS — R079 Chest pain, unspecified: Secondary | ICD-10-CM | POA: Diagnosis not present

## 2017-08-31 DIAGNOSIS — Z87891 Personal history of nicotine dependence: Secondary | ICD-10-CM | POA: Diagnosis not present

## 2017-08-31 DIAGNOSIS — R509 Fever, unspecified: Secondary | ICD-10-CM | POA: Diagnosis not present

## 2017-08-31 LAB — CBC
HCT: 37.5 % (ref 36.0–46.0)
HEMOGLOBIN: 11.9 g/dL — AB (ref 12.0–15.0)
MCH: 26.8 pg (ref 26.0–34.0)
MCHC: 31.7 g/dL (ref 30.0–36.0)
MCV: 84.5 fL (ref 78.0–100.0)
Platelets: 268 10*3/uL (ref 150–400)
RBC: 4.44 MIL/uL (ref 3.87–5.11)
RDW: 14.2 % (ref 11.5–15.5)
WBC: 9.9 10*3/uL (ref 4.0–10.5)

## 2017-08-31 LAB — BASIC METABOLIC PANEL
ANION GAP: 8 (ref 5–15)
BUN: 9 mg/dL (ref 6–20)
CO2: 24 mmol/L (ref 22–32)
Calcium: 8.5 mg/dL — ABNORMAL LOW (ref 8.9–10.3)
Chloride: 106 mmol/L (ref 101–111)
Creatinine, Ser: 0.81 mg/dL (ref 0.44–1.00)
Glucose, Bld: 106 mg/dL — ABNORMAL HIGH (ref 65–99)
Potassium: 4.3 mmol/L (ref 3.5–5.1)
SODIUM: 138 mmol/L (ref 135–145)

## 2017-08-31 LAB — I-STAT TROPONIN, ED: Troponin i, poc: 0 ng/mL (ref 0.00–0.08)

## 2017-08-31 MED ORDER — DOXYCYCLINE HYCLATE 100 MG PO TABS
100.0000 mg | ORAL_TABLET | Freq: Two times a day (BID) | ORAL | Status: DC
  Administered 2017-08-31: 100 mg via ORAL
  Filled 2017-08-31: qty 1

## 2017-08-31 MED ORDER — ALBUTEROL SULFATE HFA 108 (90 BASE) MCG/ACT IN AERS
2.0000 | INHALATION_SPRAY | Freq: Once | RESPIRATORY_TRACT | Status: AC
  Administered 2017-08-31: 2 via RESPIRATORY_TRACT
  Filled 2017-08-31: qty 6.7

## 2017-08-31 MED ORDER — ACETAMINOPHEN 325 MG PO TABS
650.0000 mg | ORAL_TABLET | Freq: Once | ORAL | Status: AC
  Administered 2017-08-31: 650 mg via ORAL
  Filled 2017-08-31: qty 2

## 2017-08-31 MED ORDER — DOXYCYCLINE HYCLATE 50 MG PO CAPS: 50.0000 mg | ORAL_CAPSULE | Freq: Two times a day (BID) | ORAL | 0 refills | Status: DC

## 2017-08-31 NOTE — ED Provider Notes (Signed)
[facility] Provider Note   CSN: [id number] Arrival date & time: 08/31/17  1057     History   Chief Complaint Chief Complaint  Patient presents with  . Flu-like symptoms    HPI Shelia Berg is a 67 y.o. female.  HPI   68yo female presents with concern for fever and cough. Reports 2-3 days of cough which is nonproductive. Denies dyspnea but reports noisy breathing. 2-3 days of fever up to 102 at home. No congestion. No myalgias. No vomiting, no urinary symptoms. No chest pain. Reports wheezing/noisy breathing. Does not smoke. Denies hx of COPD.  Denies using albuterol in the past, noted to be formal smoker and received meds in clinic before.  Has seen pulmology   Past Medical History:  Diagnosis Date  . Arthritis   . Bladder cancer ([place]) 6 years ago    Patient Active Problem List   Diagnosis Date Noted  . Cough 12/20/2016  . Acute bronchitis 12/20/2016  . Pleuritic chest pain 03/05/2016  . Pleurisy with effusion 03/05/2016  . Tobacco dependence in remission 03/05/2016  . Hyperkalemia 08/04/2015  . Hypokalemia 06/07/2014  . Osteoarthritis 10/03/2006  . FRACTURE, WRIST, LEFT 05/20/2001  . HX, PERSONAL, VENOUS THROMBOSIS/EMBOLISM 05/15/1987  . TOTAL ABDOMINAL HYSTERECTOMY, HX OF 05/14/1986    Past Surgical History:  Procedure Laterality Date  . ABDOMINAL HYSTERECTOMY       OB History   None      Home Medications    Prior to Admission medications   Medication Sig Start Date End Date Taking? Authorizing Provider  acetaminophen (TYLENOL) 325 MG tablet Take 2 tablets (650 mg total) by mouth every 6 (six) hours as needed for fever. 02/16/17   Mesner, Corene Cornea, MD  albuterol (PROVENTIL HFA;VENTOLIN HFA) 108 (90 Base) MCG/ACT inhaler Inhale 2 puffs into the lungs every 6 (six) hours as needed for wheezing or shortness of breath. 06/14/16   Alveda Reasons, MD  Ascorbic Acid (VITAMIN C) 100 MG tablet Take 100 mg by mouth daily.     [provider]  aspirin 81 MG tablet Take 81 mg by mouth daily.    [provider]  azithromycin (ZITHROMAX) 250 MG tablet Take 1 tablet (250 mg total) by mouth daily. Take 1 every day until finished. 02/16/17   Mesner, Corene Cornea, MD  cephALEXin (KEFLEX) 500 MG capsule Take 1 capsule (500 mg total) by mouth 4 (four) times daily. 02/16/17   Mesner, Corene Cornea, MD  cyclobenzaprine (FLEXERIL) 5 MG tablet Take 1 tablet (5 mg total) by mouth 3 (three) times daily as needed. Patient taking differently: Take 5 mg by mouth 3 (three) times daily as needed for muscle spasms.  02/14/17   Jaynee Eagles, PA-C  doxycycline (VIBRAMYCIN) 50 MG capsule Take 1 capsule (50 mg total) by mouth 2 (two) times daily for 7 days. 08/31/17 09/07/17  Gareth Morgan, MD  ibuprofen (ADVIL,MOTRIN) 800 MG tablet TAKE 1 TABLET BY MOUTH EVERY 8 HOURS AS NEEDED Patient taking differently: Take 800 mg by mouth every 8 hours as needed for pain 01/22/17   Collene Gobble, MD  meloxicam (MOBIC) 7.5 MG tablet Take 1 tablet (7.5 mg total) by mouth daily. 02/14/17   Jaynee Eagles, PA-C  Omega-3 Fatty Acids (FISH OIL) 1000 MG CAPS Take 1,000 mg by mouth daily.     [provider]    Family History History reviewed. No pertinent family history.  Social History Social History   Tobacco Use  . Smoking status:  Former Smoker    Packs/day: 0.50    Years: 30.00    Pack years: 15.00    Types: Cigarettes    Last attempt to quit: 06/05/2014    Years since quitting: 3.2  . Smokeless tobacco: Never Used  Substance Use Topics  . Alcohol use: No    Alcohol/week: 0.0 oz  . Drug use: No     Allergies   Contrast media [iodinated diagnostic agents]   Review of Systems Review of Systems  Constitutional: Positive for fever.  HENT: Negative for sore throat.   Eyes: Negative for visual disturbance.  Respiratory: Positive for cough. Negative for shortness of breath.   Cardiovascular: Negative for chest pain.  Gastrointestinal:  Negative for abdominal pain, nausea and vomiting.  Genitourinary: Negative for difficulty urinating.  Musculoskeletal: Negative for back pain and neck pain.  Skin: Negative for rash.  Neurological: Negative for syncope and headaches.     Physical Exam Updated Vital Signs BP 133/71   Pulse 83   Temp (!) 100.5 F (38.1 C) (Oral)   Resp 14   SpO2 98%   Physical Exam  Constitutional: She is oriented to person, place, and time. She appears well-developed and well-nourished. No distress.  HENT:  Head: Normocephalic and atraumatic.  Eyes: Conjunctivae and EOM are normal.  Neck: Normal range of motion.  Cardiovascular: Normal rate, regular rhythm, normal heart sounds and intact distal pulses. Exam reveals no gallop and no friction rub.  No murmur heard. Pulmonary/Chest: Effort normal. No respiratory distress. She has wheezes (expiratory occasional and scattered rhonchi). She has no rales.  Abdominal: Soft. She exhibits no distension. There is no tenderness. There is no guarding.  Musculoskeletal: She exhibits no edema or tenderness.  Neurological: She is alert and oriented to person, place, and time.  Skin: Skin is warm and dry. No rash noted. She is not diaphoretic. No erythema.  Nursing note and vitals reviewed.    ED Treatments / Results  Labs (all labs ordered are listed, but only abnormal results are displayed) Labs Reviewed  BASIC METABOLIC PANEL - Abnormal; Notable for the following components:      Result Value   Glucose, Bld 106 (*)    Calcium 8.5 (*)    All other components within normal limits  CBC - Abnormal; Notable for the following components:   Hemoglobin 11.9 (*)    All other components within normal limits  I-STAT TROPONIN, ED    EKG EKG Interpretation  Date/Time:  Saturday August 31 2017 11:46:42 EDT Ventricular Rate:  103 PR Interval:  136 QRS Duration: 80 QT Interval:  332 QTC Calculation: 434 R Axis:   75 Text Interpretation:  Sinus tachycardia  Possible Anterior infarct , age undetermined Abnormal ECG No significant change since last tracing Confirmed by Gareth Morgan [id number]) on 08/31/2017 1:45:05 PM   Radiology Dg Chest 2 View  Result Date: 08/31/2017 CLINICAL DATA:  Chest pain.  Cough and congestion. EXAM: CHEST - 2 VIEW COMPARISON:  02/16/2017 CT.  02/16/2017 plain film. FINDINGS: Midline trachea. Normal heart size. Atherosclerosis in the transverse aorta. Possible right paratracheal soft tissue fullness on the frontal radiograph. No pleural effusion or pneumothorax. Right infrahilar soft tissue fullness is either persistent or recurrent since 02/16/2017. This projects slightly more inferiorly than the right perihilar soft tissue fullness on the prior. There is surrounding patchy right lower lobe airspace disease which is new or increased since 02/16/2017. Diffuse peribronchial thickening. IMPRESSION: Right infrahilar soft tissue fullness with more peripheral right lower  lobe patchy airspace disease. Favor infection. Central obstructive mass or adenopathy cannot be excluded. This could be re-evaluated with short term radiographs in 1-2 weeks after antibiotic therapy 1 or more entirely characterized with chest CT (preferably with contrast). Aortic Atherosclerosis (ICD10-I70.0). Possible right paratracheal soft tissue fullness/adenopathy. Recommend attention on follow-up. Electronically Signed   By: Abigail Miyamoto M.D.   On: 08/31/2017 12:14    Procedures Procedures (including critical care time)  Medications Ordered in ED Medications  acetaminophen (TYLENOL) tablet 650 mg (650 mg Oral Given 08/31/17 1142)  albuterol (PROVENTIL HFA;VENTOLIN HFA) 108 (90 Base) MCG/ACT inhaler 2 puff (2 puffs Inhalation Given 08/31/17 1420)     Initial Impression / Assessment and Plan / ED Course  I have reviewed the triage vital signs and the nursing notes.  Pertinent labs & imaging results that were available during my care of the patient were reviewed  by me and considered in my medical decision making (see chart for details).     67yo female presents with concern for fever and cough. Reports 2-3 days of cough which is nonproductive.  Labs without significant abnormalities.  XR with right infrahilar fullness, in setting of fever and cough suspect pneumonia, however discussed need for 1-2 week CXR or chest CT for further evaluation.  Given rx for doxycycline. Patient discharged in stable condition with understanding of reasons to return. Given albuterol inhaler.  Final Clinical Impressions(s) / ED Diagnoses   Final diagnoses:  Pneumonia of right lower lobe due to infectious organism [facility])    ED Discharge Orders        Ordered    doxycycline (VIBRAMYCIN) 50 MG capsule  2 times daily     08/31/17 1405       Gareth Morgan, MD 08/31/17 2322

## 2017-08-31 NOTE — ED Notes (Signed)
Patient verbalizes understanding of discharge instructions. Opportunity for questioning and answers were provided. Armband removed by staff, pt discharged from ED.  

## 2017-08-31 NOTE — ED Triage Notes (Signed)
Patient presents to ED for flu-like symptoms, cough, congestion, body aches, fever, generalized malaise and states she has not been able to eat or drink very well.  She states hx of high ammonia with similar symptoms as well.  C/o chest  and back pain with symptoms.

## 2017-09-06 ENCOUNTER — Ambulatory Visit (INDEPENDENT_AMBULATORY_CARE_PROVIDER_SITE_OTHER): Payer: Medicare Other

## 2017-09-06 ENCOUNTER — Ambulatory Visit (INDEPENDENT_AMBULATORY_CARE_PROVIDER_SITE_OTHER): Payer: Medicare Other | Admitting: Urgent Care

## 2017-09-06 ENCOUNTER — Encounter: Payer: Self-pay | Admitting: Urgent Care

## 2017-09-06 VITALS — BP 140/60 | HR 83 | Temp 97.5°F | Resp 16 | Ht 62.0 in | Wt 161.2 lb

## 2017-09-06 DIAGNOSIS — R05 Cough: Secondary | ICD-10-CM

## 2017-09-06 DIAGNOSIS — Z87891 Personal history of nicotine dependence: Secondary | ICD-10-CM

## 2017-09-06 DIAGNOSIS — R0781 Pleurodynia: Secondary | ICD-10-CM

## 2017-09-06 DIAGNOSIS — J189 Pneumonia, unspecified organism: Secondary | ICD-10-CM

## 2017-09-06 DIAGNOSIS — R059 Cough, unspecified: Secondary | ICD-10-CM

## 2017-09-06 MED ORDER — BENZONATATE 100 MG PO CAPS: 100.0000 mg | ORAL_CAPSULE | Freq: Three times a day (TID) | ORAL | 0 refills | Status: DC | PRN

## 2017-09-06 MED ORDER — LEVOFLOXACIN 750 MG PO TABS: 750.0000 mg | ORAL_TABLET | Freq: Every day | ORAL | 0 refills | Status: DC

## 2017-09-06 MED ORDER — HYDROCODONE-HOMATROPINE 5-1.5 MG/5ML PO SYRP: 5.0000 mL | ORAL_SOLUTION | Freq: Every evening | ORAL | 0 refills | Status: DC | PRN

## 2017-09-06 NOTE — Patient Instructions (Addendum)
  Community-Acquired Pneumonia, Adult Pneumonia is an infection of the lungs. One type of pneumonia can happen while a person is in a hospital. A different type can happen when a person is not in a hospital (community-acquired pneumonia). It is easy for this kind to spread from person to person. It can spread to you if you breathe near an infected person who coughs or sneezes. Some symptoms include:  A dry cough.  A wet (productive) cough.  Fever.  Sweating.  Chest pain.  Follow these instructions at home:  Take over-the-counter and prescription medicines only as told by your doctor. ? Only take cough medicine if you are losing sleep. ? If you were prescribed an antibiotic medicine, take it as told by your doctor. Do not stop taking the antibiotic even if you start to feel better.  Sleep with your head and neck raised (elevated). You can do this by putting a few pillows under your head, or you can sleep in a recliner.  Do not use tobacco products. These include cigarettes, chewing tobacco, and e-cigarettes. If you need help quitting, ask your doctor.  Drink enough water to keep your pee (urine) clear or pale yellow. A shot (vaccine) can help prevent pneumonia. Shots are often suggested for:  People older than 65 years of age.  People older than 66 years of age: ? Who are having cancer treatment. ? Who have long-term (chronic) lung disease. ? Who have problems with their body's defense system (immune system).  You may also prevent pneumonia if you take these actions:  Get the flu (influenza) shot every year.  Go to the dentist as often as told.  Wash your hands often. If soap and water are not available, use hand sanitizer.  Contact a doctor if:  You have a fever.  You lose sleep because your cough medicine does not help. Get help right away if:  You are short of breath and it gets worse.  You have more chest pain.  Your sickness gets worse. This is very serious  if: ? You are an older adult. ? Your body's defense system is weak.  You cough up blood. This information is not intended to replace advice given to you by your health care provider. Make sure you discuss any questions you have with your health care provider. Document Released: 10/17/2007 Document Revised: 10/06/2015 Document Reviewed: 08/25/2014 Elsevier Interactive Patient Education  2018 Elsevier Inc.   IF you received an x-ray today, you will receive an invoice from [facility]. Please contact [facility] at [phone] with questions or concerns regarding your invoice.   IF you received labwork today, you will receive an invoice from [facility]. Please contact [facility] at [phone] with questions or concerns regarding your invoice.   Our billing staff will not be able to assist you with questions regarding bills from these companies.  You will be contacted with the lab results as soon as they are available. The fastest way to get your results is to activate your My Chart account. Instructions are located on the last page of this paperwork. If you have not heard from us regarding the results in 2 weeks, please contact this office.      

## 2017-09-06 NOTE — Addendum Note (Signed)
Addended by: Jaynee Eagles on: 09/06/2017 01:48 PM   Modules accepted: Orders

## 2017-09-06 NOTE — Progress Notes (Signed)
MRN: [id number] DOB: 03/17/1951  Subjective:   Shelia Berg is a 67 y.o. female presenting for follow up on pneumonia.  Patient was seen in the ER on 08/31/2017, started on doxycycline due to chest x-ray findings, "Right infrahilar soft tissue fullness with more peripheral right lower lobe patchy airspace disease. Favor infection. Central obstructive mass or adenopathy cannot be excluded. This could be re-evaluated with short term radiographs in 1-2 weeks after antibiotic therapy 1 or more entirely characterized with chest CT (preferably with contrast). Aortic Atherosclerosis (ICD10-I70.0). Possible right paratracheal soft tissue fullness/adenopathy. Recommend attention on follow-up."  Patient has greater than 40-pack-year history.  Today, she reports that she has very little improvement with doxycycline.  Reports that she still has a productive cough, difficulty with back pain when she takes a deep breath.  Denies fever, sinus congestion, sore throat, shortness of breath, wheezing, nausea, vomiting, belly pain, weight loss, rashes.  Shelia Berg has a current medication list which includes the following prescription(s): aspirin, celecoxib, and fish oil. Also is allergic to contrast media [iodinated diagnostic agents].  Shelia Berg  has a past medical history of Arthritis and Bladder cancer ([place]) (6 years ago). Also  has a past surgical history that includes Abdominal hysterectomy.  Objective:   Vitals: BP 140/60   Pulse 83   Temp (!) 97.5 F (36.4 C) (Oral)   Resp 16   Ht 5\' 2"  (1.575 m)   Wt 161 lb 3.2 oz (73.1 kg)   SpO2 97%   BMI 29.48 kg/m   Physical Exam  Constitutional: She is oriented to person, place, and time. She appears well-developed and well-nourished.  HENT:  Mouth/Throat: Oropharynx is clear and moist.  Eyes: No scleral icterus.  Neck: Normal range of motion. Neck supple.  Cardiovascular: Normal rate, regular rhythm and intact distal pulses. Exam reveals no gallop and no  friction rub.  No murmur heard. Pulmonary/Chest: No respiratory distress. She has no wheezes. She has no rales.  Abdominal: Soft. Bowel sounds are normal. She exhibits no distension and no mass. There is no tenderness. There is no rebound and no guarding.  Lymphadenopathy:    She has no cervical adenopathy.  Neurological: She is alert and oriented to person, place, and time.  Skin: Skin is warm and dry.   Dg Chest 2 View  Result Date: 09/06/2017 CLINICAL DATA:  Cough and fevers EXAM: CHEST - 2 VIEW COMPARISON:  08/31/2017 FINDINGS: Cardiac shadow is within normal limits. The lungs are well aerated bilaterally. Persistent basilar infiltrate on the right is noted. Mild thickening of the right minor fissure is noted as well. No other focal abnormality is seen. IMPRESSION: Relatively stable appearance of the chest. Continued follow-up is recommended as previously described. Electronically Signed   By: Inez Catalina M.D.   On: 09/06/2017 11:02    Assessment and Plan :   Community acquired pneumonia, unspecified laterality - Plan: DG Chest 2 View  Cough  Pleuritic pain  Stopped smoking with greater than 40 pack year history  Due to minimal improvement I will have patient stop doxycycline and start Levaquin. Counseled patient on potential for adverse effects with medications prescribed today, patient verbalized understanding.  I counseled patient that we need to pursue a chest CT but she was not agreeable to this, prefers to have chest x-ray done again.  I counseled that if it is abnormal next time we have to pursue a chest CT without contrast. Counseled patient on potential for adverse effects with medications prescribed today,  patient verbalized understanding. Return-to-clinic precautions discussed, patient verbalized understanding.  Otherwise follow-up in 1 week.  Jaynee Eagles, PA-C [facility] [phone] 09/06/2017 10:49 AM

## 2017-09-13 ENCOUNTER — Encounter: Payer: Self-pay | Admitting: Urgent Care

## 2017-09-13 ENCOUNTER — Ambulatory Visit (INDEPENDENT_AMBULATORY_CARE_PROVIDER_SITE_OTHER): Payer: Medicare Other | Admitting: Urgent Care

## 2017-09-13 VITALS — BP 108/50 | HR 78 | Temp 97.4°F | Ht 62.6 in | Wt 159.2 lb

## 2017-09-13 DIAGNOSIS — R059 Cough, unspecified: Secondary | ICD-10-CM

## 2017-09-13 DIAGNOSIS — R9389 Abnormal findings on diagnostic imaging of other specified body structures: Secondary | ICD-10-CM | POA: Diagnosis not present

## 2017-09-13 DIAGNOSIS — J189 Pneumonia, unspecified organism: Secondary | ICD-10-CM

## 2017-09-13 DIAGNOSIS — R05 Cough: Secondary | ICD-10-CM

## 2017-09-13 DIAGNOSIS — Z87891 Personal history of nicotine dependence: Secondary | ICD-10-CM | POA: Diagnosis not present

## 2017-09-13 NOTE — Patient Instructions (Addendum)
Community-Acquired Pneumonia, Adult Pneumonia is an infection of the lungs. There are different types of pneumonia. One type can develop while a person is in a hospital. A different type, called community-acquired pneumonia, develops in people who are not, or have not recently been, in the hospital or other health care facility. What are the causes? Pneumonia may be caused by bacteria, viruses, or funguses. Community-acquired pneumonia is often caused by Streptococcus pneumonia bacteria. These bacteria are often passed from one person to another by breathing in droplets from the cough or sneeze of an infected person. What increases the risk? The condition is more likely to develop in:  People who havechronic diseases, such as chronic obstructive pulmonary disease (COPD), asthma, congestive heart failure, cystic fibrosis, diabetes, or kidney disease.  People who haveearly-stage or late-stage HIV.  People who havesickle cell disease.  People who havehad their spleen removed (splenectomy).  People who havepoor dental hygiene.  People who havemedical conditions that increase the risk of breathing in (aspirating) secretions their own mouth and nose.  People who havea weakened immune system (immunocompromised).  People who smoke.  People whotravel to areas where pneumonia-causing germs commonly exist.  People whoare around animal habitats or animals that have pneumonia-causing germs, including birds, bats, rabbits, cats, and farm animals.  What are the signs or symptoms? Symptoms of this condition include:  Adry cough.  A wet (productive) cough.  Fever.  Sweating.  Chest pain, especially when breathing deeply or coughing.  Rapid breathing or difficulty breathing.  Shortness of breath.  Shaking chills.  Fatigue.  Muscle aches.  How is this diagnosed? Your health care provider will take a medical history and perform a physical exam. You may also have other tests,  including:  Imaging studies of your chest, including X-rays.  Tests to check your blood oxygen level and other blood gases.  Other tests on blood, mucus (sputum), fluid around your lungs (pleural fluid), and urine.  If your pneumonia is severe, other tests may be done to identify the specific cause of your illness. How is this treated? The type of treatment that you receive depends on many factors, such as the cause of your pneumonia, the medicines you take, and other medical conditions that you have. For most adults, treatment and recovery from pneumonia may occur at home. In some cases, treatment must happen in a hospital. Treatment may include:  Antibiotic medicines, if the pneumonia was caused by bacteria.  Antiviral medicines, if the pneumonia was caused by a virus.  Medicines that are given by mouth or through an IV tube.  Oxygen.  Respiratory therapy.  Although rare, treating severe pneumonia may include:  Mechanical ventilation. This is done if you are not breathing well on your own and you cannot maintain a safe blood oxygen level.  Thoracentesis. This procedureremoves fluid around one lung or both lungs to help you breathe better.  Follow these instructions at home:  Take over-the-counter and prescription medicines only as told by your health care provider. ? Only takecough medicine if you are losing sleep. Understand that cough medicine can prevent your body's natural ability to remove mucus from your lungs. ? If you were prescribed an antibiotic medicine, take it as told by your health care provider. Do not stop taking the antibiotic even if you start to feel better.  Sleep in a semi-upright position at night. Try sleeping in a reclining chair, or place a few pillows under your head.  Do not use tobacco products, including cigarettes, chewing   tobacco, and e-cigarettes. If you need help quitting, ask your health care provider.  Drink enough water to keep your urine  clear or pale yellow. This will help to thin out mucus secretions in your lungs. How is this prevented? There are ways that you can decrease your risk of developing community-acquired pneumonia. Consider getting a pneumococcal vaccine if:  You are older than 65 years of age.  You are older than 66 years of age and are undergoing cancer treatment, have chronic lung disease, or have other medical conditions that affect your immune system. Ask your health care provider if this applies to you.  There are different types and schedules of pneumococcal vaccines. Ask your health care provider which vaccination option is best for you. You may also prevent community-acquired pneumonia if you take these actions:  Get an influenza vaccine every year. Ask your health care provider which type of influenza vaccine is best for you.  Go to the dentist on a regular basis.  Wash your hands often. Use hand sanitizer if soap and water are not available.  Contact a health care provider if:  You have a fever.  You are losing sleep because you cannot control your cough with cough medicine. Get help right away if:  You have worsening shortness of breath.  You have increased chest pain.  Your sickness becomes worse, especially if you are an older adult or have a weakened immune system.  You cough up blood. This information is not intended to replace advice given to you by your health care provider. Make sure you discuss any questions you have with your health care provider. Document Released: 04/30/2005 Document Revised: 09/08/2015 Document Reviewed: 08/25/2014 Elsevier Interactive Patient Education  2018 Elsevier Inc.     IF you received an x-ray today, you will receive an invoice from [facility]. Please contact [facility] at [phone] with questions or concerns regarding your invoice.   IF you received labwork today, you will receive an invoice from [facility]. Please contact  [facility] at [phone] with questions or concerns regarding your invoice.   Our billing staff will not be able to assist you with questions regarding bills from these companies.  You will be contacted with the lab results as soon as they are available. The fastest way to get your results is to activate your My Chart account. Instructions are located on the last page of this paperwork. If you have not heard from us regarding the results in 2 weeks, please contact this office.     

## 2017-09-13 NOTE — Progress Notes (Signed)
    MRN: [id number] DOB: Oct 06, 1950  Subjective:   Shelia Berg is a 67 y.o. female presenting for follow up on pneumonia.  Patient has completed a course of Levaquin as prescribed from her last office visit on 09/06/2017.  Reports improvement in all her symptoms but still has residual cough.  She has an extensive smoking history.  We will pursue CT scan of her chest given possibility of central obstructive mass or adenopathy and right-sided paratracheal soft tissue fullness as seen from chest x-ray completed on August 31, 2017.  Patient denies fever, difficulty swallowing, hoarseness, chest pain, shortness of breath, wheezing, nausea, vomiting, belly pain, weight loss.  Shelia Berg has a current medication list which includes the following prescription(s): aspirin, benzonatate, celecoxib, and fish oil. Also is allergic to contrast media [iodinated diagnostic agents].  Shelia Berg  has a past medical history of Arthritis and Bladder cancer ([place]) (6 years ago). Also  has a past surgical history that includes Abdominal hysterectomy.  Objective:   Vitals: BP (!) 108/50 (BP Location: Left Arm, Patient Position: Sitting, Cuff Size: Normal)   Pulse 78   Temp (!) 97.4 F (36.3 C) (Oral)   Ht 5' 2.6" (1.59 m)   Wt 159 lb 3.2 oz (72.2 kg)   SpO2 98%   BMI 28.56 kg/m   Wt Readings from Last 3 Encounters:  09/13/17 159 lb 3.2 oz (72.2 kg)  09/06/17 161 lb 3.2 oz (73.1 kg)  02/14/17 161 lb 9.6 oz (73.3 kg)   Physical Exam  Constitutional: She is oriented to person, place, and time. She appears well-developed and well-nourished.  HENT:  Mouth/Throat: Oropharynx is clear and moist.  Eyes: Right eye exhibits no discharge. Left eye exhibits no discharge. No scleral icterus.  Cardiovascular: Normal rate, regular rhythm and intact distal pulses. Exam reveals no gallop and no friction rub.  No murmur heard. Pulmonary/Chest: No respiratory distress. She has no wheezes. She has no rales.  Neurological: She is  alert and oriented to person, place, and time.  Skin: Skin is warm and dry.  Psychiatric: She has a normal mood and affect.   Assessment and Plan :   Community acquired pneumonia, unspecified laterality  Cough  Stopped smoking with greater than 40 pack year history  Patient has improvement in her symptoms, will hold off on repeat chest x-ray today.  Will pursue noncontrast CT imaging as above.  Will let patient know when these are scheduled.  Jaynee Eagles, PA-C [facility] [phone] 09/13/2017 11:15 AM

## 2017-09-16 NOTE — Addendum Note (Signed)
Addended by: Jaynee Eagles on: 09/16/2017 10:33 AM   Modules accepted: Orders

## 2017-09-23 ENCOUNTER — Ambulatory Visit
Admission: RE | Admit: 2017-09-23 | Discharge: 2017-09-23 | Disposition: A | Discharge status: Home / Self Care | Payer: Medicare Other | Source: Ambulatory Visit | Attending: Urgent Care | Admitting: Urgent Care

## 2017-09-23 DIAGNOSIS — R918 Other nonspecific abnormal finding of lung field: Secondary | ICD-10-CM | POA: Diagnosis not present

## 2017-09-26 ENCOUNTER — Telehealth: Payer: Self-pay | Admitting: Urgent Care

## 2017-09-26 DIAGNOSIS — J22 Unspecified acute lower respiratory infection: Secondary | ICD-10-CM

## 2017-09-26 NOTE — Telephone Encounter (Signed)
Patient called and discussed her request for Tylenol for arthritis pain. She says "I took it before when the orthopedic doctor prescribed it and it works better than what I buy at the store." I asked is it the Tylenol 8 hour that is on her history medication list, she says "yes, that's the one I took before." I advised this would be sent to the provider for approval, she verbalized understanding.  Tylenol 8 hour refill Last OV:09/13/17 Last refill:10/06/15 HCW:CB[facility] Pharmacy: [id number] - [place], [place] [phone] (Phone) [phone] (Fax)

## 2017-09-26 NOTE — Telephone Encounter (Signed)
Copied from [place] [id number]. Topic: Quick Communication - Rx Refill/Question >> Sep 26, 2017  4:53 PM Marin Olp L wrote: Medication: tylenol 650mg  Has the patient contacted their pharmacy? No. (new request) (Agent: If no, request that the patient contact the pharmacy for the refill.) Preferred Pharmacy (with phone number or street name): [id number] [address] [place] [address] Phone: [phone] Fax: [phone] Agent: Please be advised that RX refills may take up to 3 business days. We ask that you follow-up with your pharmacy.  Patient would like tylenol for arthritis in right foot. Over the counter does not help.

## 2017-09-27 NOTE — Telephone Encounter (Signed)
Patient checking status,

## 2017-09-27 NOTE — Telephone Encounter (Signed)
Pt needs OV 

## 2017-09-27 NOTE — Telephone Encounter (Signed)
Pt is calling about medication; pt states that she has an ortho visit next Thursday but the regular tylenol does nothing to help her, call pt to advise

## 2017-10-03 DIAGNOSIS — M25571 Pain in right ankle and joints of right foot: Secondary | ICD-10-CM | POA: Diagnosis not present

## 2017-11-07 ENCOUNTER — Ambulatory Visit (INDEPENDENT_AMBULATORY_CARE_PROVIDER_SITE_OTHER): Payer: Medicare Other

## 2017-11-07 ENCOUNTER — Encounter: Payer: Self-pay | Admitting: Urgent Care

## 2017-11-07 ENCOUNTER — Ambulatory Visit (INDEPENDENT_AMBULATORY_CARE_PROVIDER_SITE_OTHER): Payer: Medicare Other | Admitting: Urgent Care

## 2017-11-07 ENCOUNTER — Other Ambulatory Visit: Payer: Self-pay

## 2017-11-07 VITALS — BP 120/60 | HR 93 | Temp 98.7°F | Resp 16 | Ht 62.6 in | Wt 165.2 lb

## 2017-11-07 DIAGNOSIS — L309 Dermatitis, unspecified: Secondary | ICD-10-CM

## 2017-11-07 DIAGNOSIS — Z87891 Personal history of nicotine dependence: Secondary | ICD-10-CM | POA: Diagnosis not present

## 2017-11-07 DIAGNOSIS — J029 Acute pharyngitis, unspecified: Secondary | ICD-10-CM | POA: Diagnosis not present

## 2017-11-07 DIAGNOSIS — R0781 Pleurodynia: Secondary | ICD-10-CM

## 2017-11-07 DIAGNOSIS — R059 Cough, unspecified: Secondary | ICD-10-CM

## 2017-11-07 DIAGNOSIS — R05 Cough: Secondary | ICD-10-CM

## 2017-11-07 DIAGNOSIS — R070 Pain in throat: Secondary | ICD-10-CM | POA: Diagnosis not present

## 2017-11-07 DIAGNOSIS — R9389 Abnormal findings on diagnostic imaging of other specified body structures: Secondary | ICD-10-CM | POA: Diagnosis not present

## 2017-11-07 DIAGNOSIS — R079 Chest pain, unspecified: Secondary | ICD-10-CM | POA: Diagnosis not present

## 2017-11-07 LAB — POCT RAPID STREP A (OFFICE): RAPID STREP A SCREEN: NEGATIVE

## 2017-11-07 MED ORDER — PENICILLIN G BENZATHINE 1200000 UNIT/2ML IM SUSP
1.2000 10*6.[IU] | Freq: Once | INTRAMUSCULAR | Status: AC
  Administered 2017-11-07: 1.2 10*6.[IU] via INTRAMUSCULAR

## 2017-11-07 MED ORDER — PSEUDOEPHEDRINE HCL 60 MG PO TABS: 60.0000 mg | ORAL_TABLET | Freq: Three times a day (TID) | ORAL | 0 refills | Status: DC | PRN

## 2017-11-07 MED ORDER — TRIAMCINOLONE ACETONIDE 0.1 % EX CREA: 1.0000 "application " | TOPICAL_CREAM | Freq: Two times a day (BID) | CUTANEOUS | 0 refills | Status: DC

## 2017-11-07 NOTE — Progress Notes (Signed)
    MRN: [id number] DOB: 09/21/1950  Subjective:   Shelia Berg is a 67 y.o. female presenting for 2 day history of sore throat, fever (highest was 102F), malaise, mild occasional cough. Hurts over mid chest and throat to take a deep breath. Denies sinus pain, ear pain, shob, wheezing. Has tried APAP with minimal relief. Uses her BreoEllipta every morning. She is requesting a refill of triamcinolone for hand dermatitis.  Patient is requesting Tylenol with codeine for her throat pain.  Shelia Berg has a current medication list which includes the following prescription(s): aspirin, fish oil, benzonatate, and celecoxib. Also is allergic to contrast media [iodinated diagnostic agents].  Shelia Berg  has a past medical history of Arthritis and Bladder cancer ([place]) (6 years ago). Also  has a past surgical history that includes Abdominal hysterectomy.  Objective:   Vitals: BP 120/60 (BP Location: Left Arm, Patient Position: Sitting, Cuff Size: Normal)   Pulse 93   Temp 98.7 F (37.1 C) (Oral)   Resp 16   Ht 5' 2.6" (1.59 m)   Wt 165 lb 3.2 oz (74.9 kg)   SpO2 97%   BMI 29.64 kg/m   Physical Exam  Constitutional: She is oriented to person, place, and time. She appears well-developed and well-nourished.  HENT:  Rhinorrhea is present, oropharyngeal erythema.  No exudates.  Eyes: Right eye exhibits no discharge. Left eye exhibits no discharge.  Cardiovascular: Normal rate, regular rhythm, normal heart sounds and intact distal pulses. Exam reveals no gallop and no friction rub.  No murmur heard. Pulmonary/Chest: Effort normal and breath sounds normal. No stridor. No respiratory distress. She has no wheezes. She has no rales.  Neurological: She is alert and oriented to person, place, and time.  Skin: Skin is warm and dry.   Results for orders placed or performed in visit on 11/07/17 (from the past 24 hour(s))  POCT rapid strep A     Status: Normal   Collection Time: 11/07/17  5:44 PM  Result Value  Ref Range   Rapid Strep A Screen Negative Negative    Dg Chest 2 View  Result Date: 11/07/2017 CLINICAL DATA:  Cough, chest pain EXAM: CHEST - 2 VIEW COMPARISON:  09/06/2017 chest radiograph. FINDINGS: Stable cardiomediastinal silhouette with normal heart size. No pneumothorax. No pleural effusion. Stable mild scarring in the peripheral right mid lung. No pulmonary edema. No acute consolidative airspace disease. IMPRESSION: Stable mild scarring in the peripheral right mid lung. No active cardiopulmonary disease. Electronically Signed   By: Ilona Sorrel M.D.   On: 11/07/2017 17:45     Assessment and Plan :   Cough - Plan: DG Chest 2 View  Pleuritic pain - Plan: DG Chest 2 View  Stopped smoking with greater than 40 pack year history  Abnormal chest x-ray  Hand dermatitis  Throat pain - Plan: POCT rapid strep A, Culture, Group A Strep  Will cover for pharyngitis with penicillin IM. Supportive care recommended otherwise. Radiology report pending.  Jaynee Eagles, PA-C [facility] [phone] 11/07/2017  5:23 PM

## 2017-11-07 NOTE — Patient Instructions (Addendum)
Hydrate well with at least 2 liters (1 gallon) of water daily. For sore throat try using a honey-based tea. Use 3 teaspoons of honey with juice squeezed from half lemon. Place shaved pieces of ginger into 1/2-1 cup of water and warm over stove top. Then mix the ingredients and repeat every 4 hours as needed. Schedule your albuterol inhaler once every 6 hours for the pain you have with your breathing. Continue using your Breo Ellipta.     Pharyngitis Pharyngitis is redness, pain, and swelling (inflammation) of the throat (pharynx). It is a very common cause of sore throat. Pharyngitis can be caused by a bacteria, but it is usually caused by a virus. Most cases of pharyngitis get better on their own without treatment. What are the causes? This condition may be caused by:  Infection by viruses (viral). Viral pharyngitis spreads from person to person (is contagious) through coughing, sneezing, and sharing of personal items or utensils such as cups, forks, spoons, and toothbrushes.  Infection by bacteria (bacterial). Bacterial pharyngitis may be spread by touching the nose or face after coming in contact with the bacteria, or through more intimate contact, such as kissing.  Allergies. Allergies can cause buildup of mucus in the throat (post-nasal drip), leading to inflammation and irritation. Allergies can also cause blocked nasal passages, forcing breathing through the mouth, which dries and irritates the throat.  What increases the risk? You are more likely to develop this condition if:  You are 3-110 years old.  You are exposed to crowded environments such as daycare, school, or dormitory living.  You live in a cold climate.  You have a weakened disease-fighting (immune) system.  What are the signs or symptoms? Symptoms of this condition vary by the cause (viral, bacterial, or allergies) and can include:  Sore throat.  Fatigue.  Low-grade fever.  Headache.  Joint pain and muscle  aches.  Skin rashes.  Swollen glands in the throat (lymph nodes).  Plaque-like film on the throat or tonsils. This is often a symptom of bacterial pharyngitis.  Vomiting.  Stuffy nose (nasal congestion).  Cough.  Red, itchy eyes (conjunctivitis).  Loss of appetite.  How is this diagnosed? This condition is often diagnosed based on your medical history and a physical exam. Your health care provider will ask you questions about your illness and your symptoms. A swab of your throat may be done to check for bacteria (rapid strep test). Other lab tests may also be done, depending on the suspected cause, but these are rare. How is this treated? This condition usually gets better in 3-4 days without medicine. Bacterial pharyngitis may be treated with antibiotic medicines. Follow these instructions at home:  Take over-the-counter and prescription medicines only as told by your health care provider. ? If you were prescribed an antibiotic medicine, take it as told by your health care provider. Do not stop taking the antibiotic even if you start to feel better. ? Do not give children aspirin because of the association with Reye syndrome.  Drink enough water and fluids to keep your urine clear or pale yellow.  Get a lot of rest.  Gargle with a salt-water mixture 3-4 times a day or as needed. To make a salt-water mixture, completely dissolve -1 tsp of salt in 1 cup of warm water.  If your health care provider approves, you may use throat lozenges or sprays to soothe your throat. Contact a health care provider if:  You have large, tender lumps in your  neck.  You have a rash.  You cough up green, yellow-brown, or bloody spit. Get help right away if:  Your neck becomes stiff.  You drool or are unable to swallow liquids.  You cannot drink or take medicines without vomiting.  You have severe pain that does not go away, even after you take medicine.  You have trouble breathing, and  it is not caused by a stuffy nose.  You have new pain and swelling in your joints such as the knees, ankles, wrists, or elbows. Summary  Pharyngitis is redness, pain, and swelling (inflammation) of the throat (pharynx).  While pharyngitis can be caused by a bacteria, the most common causes are viral.  Most cases of pharyngitis get better on their own without treatment.  Bacterial pharyngitis is treated with antibiotic medicines. This information is not intended to replace advice given to you by your health care provider. Make sure you discuss any questions you have with your health care provider. Document Released: 04/30/2005 Document Revised: 06/05/2016 Document Reviewed: 06/05/2016 Elsevier Interactive Patient Education  2018 [employer].     IF you received an x-ray today, you will receive an invoice from [facility]. Please contact [facility] at [phone] with questions or concerns regarding your invoice.   IF you received labwork today, you will receive an invoice from [place]. Please contact [facility] at [phone] with questions or concerns regarding your invoice.   Our billing staff will not be able to assist you with questions regarding bills from these companies.  You will be contacted with the lab results as soon as they are available. The fastest way to get your results is to activate your My Chart account. Instructions are located on the last page of this paperwork. If you have not heard from [place] regarding the results in 2 weeks, please contact this office.

## 2017-11-09 LAB — CULTURE, GROUP A STREP: STREP A CULTURE: NEGATIVE

## 2017-11-12 ENCOUNTER — Telehealth: Payer: Self-pay | Admitting: Urgent Care

## 2017-11-12 NOTE — Telephone Encounter (Signed)
Patient needs an OV for a recheck. Rapid strep and strep culture were negative. Her chest x-ray was the same. She really needs another OV for more work up.  Please schedule with first available provider.

## 2017-12-10 DIAGNOSIS — M25562 Pain in left knee: Secondary | ICD-10-CM | POA: Diagnosis not present

## 2018-03-18 DIAGNOSIS — M25571 Pain in right ankle and joints of right foot: Secondary | ICD-10-CM | POA: Diagnosis not present

## 2018-06-17 DIAGNOSIS — M25571 Pain in right ankle and joints of right foot: Secondary | ICD-10-CM | POA: Diagnosis not present

## 2018-06-26 DIAGNOSIS — M25572 Pain in left ankle and joints of left foot: Secondary | ICD-10-CM | POA: Diagnosis not present

## 2018-06-26 DIAGNOSIS — M25571 Pain in right ankle and joints of right foot: Secondary | ICD-10-CM | POA: Diagnosis not present

## 2018-07-03 ENCOUNTER — Encounter: Payer: Self-pay | Admitting: Family Medicine

## 2018-07-03 ENCOUNTER — Ambulatory Visit (INDEPENDENT_AMBULATORY_CARE_PROVIDER_SITE_OTHER): Payer: Medicare Other | Admitting: Family Medicine

## 2018-07-03 ENCOUNTER — Other Ambulatory Visit: Payer: Self-pay

## 2018-07-03 VITALS — BP 157/85 | HR 80 | Temp 98.4°F | Ht 62.21 in | Wt 159.0 lb

## 2018-07-03 DIAGNOSIS — Z09 Encounter for follow-up examination after completed treatment for conditions other than malignant neoplasm: Secondary | ICD-10-CM | POA: Diagnosis not present

## 2018-07-03 DIAGNOSIS — Z0000 Encounter for general adult medical examination without abnormal findings: Secondary | ICD-10-CM | POA: Diagnosis not present

## 2018-07-03 DIAGNOSIS — R739 Hyperglycemia, unspecified: Secondary | ICD-10-CM

## 2018-07-03 DIAGNOSIS — R7309 Other abnormal glucose: Secondary | ICD-10-CM | POA: Diagnosis not present

## 2018-07-03 DIAGNOSIS — M79604 Pain in right leg: Secondary | ICD-10-CM | POA: Diagnosis not present

## 2018-07-03 DIAGNOSIS — Z86718 Personal history of other venous thrombosis and embolism: Secondary | ICD-10-CM | POA: Diagnosis not present

## 2018-07-03 DIAGNOSIS — M7989 Other specified soft tissue disorders: Secondary | ICD-10-CM

## 2018-07-03 DIAGNOSIS — L97919 Non-pressure chronic ulcer of unspecified part of right lower leg with unspecified severity: Secondary | ICD-10-CM | POA: Insufficient documentation

## 2018-07-03 LAB — GLUCOSE, POCT (MANUAL RESULT ENTRY): POC Glucose: 122 mg/dl — AB (ref 70–99)

## 2018-07-03 MED ORDER — CEPHALEXIN 500 MG PO CAPS: 500.0000 mg | ORAL_CAPSULE | Freq: Two times a day (BID) | ORAL | 0 refills | Status: AC

## 2018-07-03 MED ORDER — IBUPROFEN 800 MG PO TABS: 800.0000 mg | ORAL_TABLET | Freq: Three times a day (TID) | ORAL | 2 refills | Status: DC | PRN

## 2018-07-03 NOTE — Patient Instructions (Addendum)
If you have lab work done today you will be contacted with your lab results within the next 2 weeks.  If you have not heard from [place] then please contact us. The fastest way to get your results is to register for My Chart.   IF you received an x-ray today, you will receive an invoice from [facility]. Please contact [facility] at [phone] with questions or concerns regarding your invoice.   IF you received labwork today, you will receive an invoice from [place]. Please contact [facility] at [phone] with questions or concerns regarding your invoice.   Our billing staff will not be able to assist you with questions regarding bills from these companies.  You will be contacted with the lab results as soon as they are available. The fastest way to get your results is to activate your My Chart account. Instructions are located on the last page of this paperwork. If you have not heard from [place] regarding the results in 2 weeks, please contact this office.      Venous Ulcer  A venous ulcer is a shallow sore on your lower leg. It is caused by poor circulation in your veins. Venous ulcer is the most common type of lower leg ulcer. You may have venous ulcers on one leg or on both legs. This condition most often develops around your ankles. This type of ulcer may last for a long time (chronic ulcer) or it may return often (recurrent ulcer). Follow these instructions at home: Wound care  Follow instructions from your doctor about: ? How to take care of your wound. ? When and how you should change your bandage (dressing). ? When you should remove your bandage. If your bandage is dry and gets stuck to your leg when you try to remove it, moisten or wet the bandage with saline solution or water. This helps you to remove it without harming your skin or wound.  Check your wound every day for signs of infection. Have a caregiver do this for you if you are not able to do it  yourself. Watch for: ? More redness, swelling, or pain. ? More fluid or blood. ? Pus, warmth, or a bad smell. Medicines  Take over-the-counter and prescription medicines only as told by your doctor.  If you were prescribed an antibiotic medicine, take it or apply it as told by your doctor. Do not stop taking or using the antibiotic even if your condition improves. Activity  Do not stand or sit in one position for a long period of time. Rest with your legs raised during the day. If possible, keep your legs above your heart for 30 minutes, 3-4 times a day, or as told by your doctor.  Do not sit with your legs crossed.  Walk often to increase the blood flow in your legs. Ask your doctor what level of activity is safe for you.  If you are taking a long ride in a car or plane, take a break to walk around at least once every two hours, or as told by your doctor. Ask your doctor if you should take aspirin before long trips. General instructions   Wear elastic stockings, compression stockings, or support hose as told by your doctor. This is very important.  Raise the foot of your bed as told by your doctor.  Do not smoke.  Keep all follow-up visits as told by your doctor. This is important. Contact a doctor if:  You have a  fever.  Your ulcer is getting larger or is not healing.  Your pain gets worse.  You have more redness or swelling around your ulcer.  You have more fluid, blood, or pus coming from your ulcer after it has been cleaned by you or your doctor.  You have warmth or a bad smell coming from your ulcer. This information is not intended to replace advice given to you by your health care provider. Make sure you discuss any questions you have with your health care provider. Document Released: 06/07/2004 Document Revised: 01/23/2017 Document Reviewed: 09/08/2014 Elsevier Interactive Patient Education  2019 [place]. Cephalexin tablets or capsules What is this  medicine? CEPHALEXIN (sef a LEX in) is a cephalosporin antibiotic. It is used to treat certain kinds of bacterial infections It will not work for colds, flu, or other viral infections. This medicine may be used for other purposes; ask your health care provider or pharmacist if you have questions. COMMON BRAND NAME(S): Biocef, Daxbia, Keflex, Keftab What should I tell my health care provider before I take this medicine? They need to know if you have any of these conditions: -kidney disease -stomach or intestine problems, especially colitis -an unusual or allergic reaction to cephalexin, other cephalosporins, penicillins, other antibiotics, medicines, foods, dyes or preservatives -pregnant or trying to get pregnant -breast-feeding How should I use this medicine? Take this medicine by mouth with a full glass of water. Follow the directions on the prescription label. This medicine can be taken with or without food. Take your medicine at regular intervals. Do not take your medicine more often than directed. Take all of your medicine as directed even if you think you are better. Do not skip doses or stop your medicine early. Talk to your pediatrician regarding the use of this medicine in children. While this drug may be prescribed for selected conditions, precautions do apply. Overdosage: If you think you have taken too much of this medicine contact a poison control center or emergency room at once. NOTE: This medicine is only for you. Do not share this medicine with others. What if I miss a dose? If you miss a dose, take it as soon as you can. If it is almost time for your next dose, take only that dose. Do not take double or extra doses. There should be at least 4 to 6 hours between doses. What may interact with this medicine? -probenecid -some other antibiotics This list may not describe all possible interactions. Give your health care provider a list of all the medicines, herbs, non-prescription  drugs, or dietary supplements you use. Also tell them if you smoke, drink alcohol, or use illegal drugs. Some items may interact with your medicine. What should I watch for while using this medicine? Tell your doctor or health care professional if your symptoms do not begin to improve in a few days. Do not treat diarrhea with over the counter products. Contact your doctor if you have diarrhea that lasts more than 2 days or if it is severe and watery. If you have diabetes, you may get a false-positive result for sugar in your urine. Check with your doctor or health care professional. What side effects may I notice from receiving this medicine? Side effects that you should report to your doctor or health care professional as soon as possible: -allergic reactions like skin rash, itching or hives, swelling of the face, lips, or tongue -breathing problems -pain or trouble passing urine -redness, blistering, peeling or loosening of the  skin, including inside the mouth -severe or watery diarrhea -unusually weak or tired -yellowing of the eyes, skin Side effects that usually do not require medical attention (report to your doctor or health care professional if they continue or are bothersome): -gas or heartburn -genital or anal irritation -headache -joint or muscle pain -nausea, vomiting This list may not describe all possible side effects. Call your doctor for medical advice about side effects. You may report side effects to FDA at 1-800-FDA-1088. Where should I keep my medicine? Keep out of the reach of children. Store at room temperature between 59 and 86 degrees F (15 and 30 degrees C). Throw away any unused medicine after the expiration date. NOTE: This sheet is a summary. It may not cover all possible information. If you have questions about this medicine, talk to your doctor, pharmacist, or health care provider.  2019 Elsevier/Gold Standard (2007-08-04 17:09:13)

## 2018-07-03 NOTE — Progress Notes (Signed)
Sick Visit--Established Patient  Subjective:  Patient ID: Shelia Berg, female    DOB: [birth year]  Age: 68 y.o. MRN: [id number]  CC:  Chief Complaint  Patient presents with  . right leg pain    3 weeks (shin area) legs are cold and bad circulation   . Diabetes    check     HPI Shelia Berg is a 68 year old female who presents for follow up today.   Past Medical History:  Diagnosis Date  . Arthritis   . Bladder cancer ([place]) 6 years ago  . History of DVT (deep vein thrombosis)   . Infected traumatic leg ulcer, left, limited to breakdown of skin ([place])    Current Status: Since her last office visit, she has had increasing pain in her right pain X 1 week. She stands and walks often at work. She has a history 35-40 years ago of DVTs in both lower extremities. She denies fevers, chills, fatigue, recent infections, weight loss, and night sweats. She has not had any headaches, visual changes, dizziness, and falls. No chest pain, heart palpitations, cough and shortness of breath reported. No reports of GI problems such as nausea, vomiting, diarrhea, and constipation. She has no reports of blood in stools, dysuria and hematuria. No depression or anxiety reported.   Past Surgical History:  Procedure Laterality Date  . ABDOMINAL HYSTERECTOMY      No family history on file.  Social History   Socioeconomic History  . Marital status: Married    Spouse name: Not on file  . Number of children: Not on file  . Years of education: Not on file  . Highest education level: Not on file  Occupational History  . Not on file  Social Needs  . Financial resource strain: Not on file  . Food insecurity:    Worry: Not on file    Inability: Not on file  . Transportation needs:    Medical: Not on file    Non-medical: Not on file  Tobacco Use  . Smoking status: Former Smoker    Packs/day: 0.50    Years: 30.00    Pack years: 15.00    Types: Cigarettes    Last attempt to quit: 06/05/2014   Years since quitting: 4.0  . Smokeless tobacco: Never Used  Substance and Sexual Activity  . Alcohol use: No    Alcohol/week: 0.0 standard drinks  . Drug use: No  . Sexual activity: Yes  Lifestyle  . Physical activity:    Days per week: Not on file    Minutes per session: Not on file  . Stress: Not on file  Relationships  . Social connections:    Talks on phone: Not on file    Gets together: Not on file    Attends religious service: Not on file    Active member of club or organization: Not on file    Attends meetings of clubs or organizations: Not on file    Relationship status: Not on file  . Intimate partner violence:    Fear of current or ex partner: Not on file    Emotionally abused: Not on file    Physically abused: Not on file    Forced sexual activity: Not on file  Other Topics Concern  . Not on file  Social History Narrative  . Not on file    Outpatient Medications Prior to Visit  Medication Sig Dispense Refill  . aspirin EC 81 MG tablet Take 81 mg by  mouth daily.    [name] BREO ELLIPTA 100-25 MCG/INH AEPB Inhale 1 application into the lungs daily.  5  . meloxicam (MOBIC) 7.5 MG tablet Take 1 tablet by mouth daily.    [name] aspirin 81 MG tablet Take 81 mg by mouth daily.    . Omega-3 Fatty Acids (FISH OIL) 1000 MG CAPS Take 1,000 mg by mouth daily.     . pseudoephedrine (SUDAFED) 60 MG tablet Take 1 tablet (60 mg total) by mouth every 8 (eight) hours as needed. 30 tablet 0  . triamcinolone cream (KENALOG) 0.1 % Apply 1 application topically 2 (two) times daily. 30 g 0   No facility-administered medications prior to visit.     Allergies  Allergen Reactions  . Contrast Media [Iodinated Diagnostic Agents] Anaphylaxis    RESPIRATORY ARREST/Patient stopped breathing    ROS Review of Systems  Constitutional: Negative.   HENT: Negative.   Eyes: Negative.   Respiratory: Negative.   Cardiovascular: Positive for leg swelling (right leg pain, swelling, and cool touch. ).    Gastrointestinal: Negative.   Endocrine: Negative.   Genitourinary: Negative.   Musculoskeletal: Negative.   Skin: Negative.   Allergic/Immunologic: Negative.   Neurological: Negative.   Hematological: Negative.   Psychiatric/Behavioral: Negative.    Objective:    Physical Exam  Constitutional: She is oriented to person, place, and time. She appears well-developed and well-nourished.  HENT:  Head: Normocephalic and atraumatic.  Eyes: Conjunctivae are normal.  Neck: Normal range of motion. Neck supple.  Cardiovascular: Normal rate, regular rhythm, normal heart sounds and intact distal pulses.  Pulmonary/Chest: Effort normal and breath sounds normal.  Abdominal: Soft. Bowel sounds are normal.  Musculoskeletal: Normal range of motion.        General: Tenderness (pain, ulcer) and edema (right leg, cool to touch, positive lower extremity pulses. ) present.  Neurological: She is alert and oriented to person, place, and time. She has normal reflexes.  Skin: Skin is warm and dry. There is erythema (right lower leg erythema, swelling, pain). There is pallor (right lower leg, cool to touch).  Psychiatric: She has a normal mood and affect. Her behavior is normal. Judgment and thought content normal.  Nursing note and vitals reviewed.  BP (!) 157/85 (BP Location: Right Arm, Patient Position: Sitting, Cuff Size: Large)   Pulse 80   Temp 98.4 F (36.9 C) (Oral)   Ht 5' 2.21" (1.58 m)   Wt 159 lb (72.1 kg)   BMI 28.89 kg/m  Wt Readings from Last 3 Encounters:  07/03/18 159 lb (72.1 kg)  11/07/17 165 lb 3.2 oz (74.9 kg)  09/13/17 159 lb 3.2 oz (72.2 kg)    Health Maintenance Due  Topic Date Due  . TETANUS/TDAP  09/23/1970  . MAMMOGRAM  09/22/2001  . COLONOSCOPY  09/22/2001  . DEXA SCAN  09/22/2016  . PNA vac Low Risk Adult (1 of 2 - PCV13) 09/22/2016  . INFLUENZA VACCINE  12/12/2017    There are no preventive care reminders to display for this patient.  Lab Results  Component  Value Date   TSH 0.443 06/18/2014   Lab Results  Component Value Date   WBC 9.9 08/31/2017   HGB 11.9 (L) 08/31/2017   HCT 37.5 08/31/2017   MCV 84.5 08/31/2017   PLT 268 08/31/2017   Lab Results  Component Value Date   NA 138 08/31/2017   K 4.3 08/31/2017   CO2 24 08/31/2017   GLUCOSE 106 (H) 08/31/2017   BUN 9  08/31/2017   CREATININE 0.81 08/31/2017   BILITOT 0.3 08/03/2015   ALKPHOS 104 08/03/2015   AST 18 08/03/2015   ALT 11 08/03/2015   PROT 7.3 08/03/2015   ALBUMIN 3.9 08/03/2015   CALCIUM 8.5 (L) 08/31/2017   ANIONGAP 8 08/31/2017   No results found for: CHOL No results found for: HDL No results found for: LDLCALC No results found for: TRIG No results found for: CHOLHDL No results found for: HGBA1C  Assessment & Plan:   1. Ulcer of right lower extremity, unspecified ulcer stage ([place]) We will get Doppler US of right leg and refer patient to Vascular Surgeon for further evaluation. We will initiate Keflex and Motrin today.  - Ambulatory referral to Vascular Surgery - cephALEXin (KEFLEX) 500 MG capsule; Take 1 capsule (500 mg total) by mouth 2 (two) times daily for 10 days.  Dispense: 20 capsule; Refill: 0     2. Right leg pain - VAS [place] LOWER EXTREMITY VENOUS (DVT); Future - Ambulatory referral to Vascular Surgery - ibuprofen (ADVIL,MOTRIN) 800 MG tablet; Take 1 tablet (800 mg total) by mouth every 8 (eight) hours as needed.  Dispense: 30 tablet; Refill: 2  3. Right leg swelling - VAS [place] LOWER EXTREMITY VENOUS (DVT); Future - Ambulatory referral to Vascular Surgery  4. Healthcare maintenance - Comprehensive metabolic panel - TSH - POCT glucose (manual entry)  5. Follow up She will follow up as needed.   Meds ordered this encounter  Medications  . cephALEXin (KEFLEX) 500 MG capsule    Sig: Take 1 capsule (500 mg total) by mouth 2 (two) times daily for 10 days.    Dispense:  20 capsule    Refill:  0  . ibuprofen (ADVIL,MOTRIN) 800 MG tablet     Sig: Take 1 tablet (800 mg total) by mouth every 8 (eight) hours as needed.    Dispense:  30 tablet    Refill:  2   Orders Placed This Encounter  Procedures  . Comprehensive metabolic panel  . TSH  . Ambulatory referral to Vascular Surgery  . POCT glucose (manual entry)    Referral Orders     Ambulatory referral to Vascular Surgery   Kathe Becton,  MSN, FNP-C Primary Care at [facility], [place] [address] [phone]  Problem List Items Addressed This Visit    None    Visit Diagnoses    Ulcer of right lower extremity, unspecified ulcer stage ([place])    -  Primary   Relevant Medications   cephALEXin (KEFLEX) 500 MG capsule   Other Relevant Orders   Ambulatory referral to Vascular Surgery   Right leg pain       Relevant Medications   ibuprofen (ADVIL,MOTRIN) 800 MG tablet   Other Relevant Orders   VAS [place] LOWER EXTREMITY VENOUS (DVT)   Ambulatory referral to Vascular Surgery   Right leg swelling       Relevant Orders   VAS [place] LOWER EXTREMITY VENOUS (DVT)   Ambulatory referral to Vascular Surgery   Healthcare maintenance       Relevant Orders   Comprehensive metabolic panel   TSH   POCT glucose (manual entry) (Completed)   Follow up          Meds ordered this encounter  Medications  . cephALEXin (KEFLEX) 500 MG capsule    Sig: Take 1 capsule (500 mg total) by mouth 2 (two) times daily for 10 days.    Dispense:  20 capsule  Refill:  0  . ibuprofen (ADVIL,MOTRIN) 800 MG tablet    Sig: Take 1 tablet (800 mg total) by mouth every 8 (eight) hours as needed.    Dispense:  30 tablet    Refill:  2    Follow-up: No follow-ups on file.    Azzie Glatter, FNP

## 2018-07-04 ENCOUNTER — Ambulatory Visit: Payer: Self-pay | Admitting: *Deleted

## 2018-07-04 LAB — COMPREHENSIVE METABOLIC PANEL
ALT: 59 IU/L — ABNORMAL HIGH (ref 0–32)
AST: 58 IU/L — ABNORMAL HIGH (ref 0–40)
Albumin/Globulin Ratio: 1.2 (ref 1.2–2.2)
Albumin: 3.7 g/dL — ABNORMAL LOW (ref 3.8–4.8)
Alkaline Phosphatase: 204 IU/L — ABNORMAL HIGH (ref 39–117)
BUN/Creatinine Ratio: 21 (ref 12–28)
BUN: 23 mg/dL (ref 8–27)
Bilirubin Total: <0.2 mg/dL (ref 0.0–1.2)
CO2: 22 mmol/L (ref 20–29)
Calcium: 8.5 mg/dL — ABNORMAL LOW (ref 8.7–10.3)
Chloride: 104 mmol/L (ref 96–106)
Creatinine, Ser: 1.12 mg/dL — ABNORMAL HIGH (ref 0.57–1.00)
GFR calc Af Amer: 59 mL/min/{1.73_m2} — ABNORMAL LOW (ref >59)
GFR calc non Af Amer: 51 mL/min/{1.73_m2} — ABNORMAL LOW (ref >59)
Globulin, Total: 3.2 g/dL (ref 1.5–4.5)
Glucose: 127 mg/dL — ABNORMAL HIGH (ref 65–99)
Potassium: 5.1 mmol/L (ref 3.5–5.2)
Sodium: 141 mmol/L (ref 134–144)
Total Protein: 6.9 g/dL (ref 6.0–8.5)

## 2018-07-04 LAB — TSH: TSH: 0.815 u[IU]/mL (ref 0.450–4.500)

## 2018-07-04 NOTE — Telephone Encounter (Addendum)
Per initial encounter dated 07/04/2018 at 1340, "pt calling stating that the provider didn't give her anything for her allergies that she has nasal congestion and cant breath through it she was given an antibiotic yesterday; [id number] [address], [place] [phone] (Phone) [phone] (Fax)"    Attempted to contact pt; left message on voicemail [phone]

## 2018-07-04 NOTE — Telephone Encounter (Signed)
Attempted to contact pt; left message on voicemail [phone]; will route to office for final disposition; pt last seen by Kathe Becton, [place], 07/03/2018.

## 2018-07-08 NOTE — Telephone Encounter (Signed)
Spoke with patient she stated the allergies is better. She just needed to know why have she have not got a call refer referral to have [place]. Called referral team and they stated the order was not in as a referral and it was not seen. She will called now to schedule. Appt is made for Thurs 07/10/18 @ 2:45. Patient was informed and is ok with this appt

## 2018-07-09 ENCOUNTER — Telehealth: Payer: Self-pay | Admitting: Family Medicine

## 2018-07-09 NOTE — Telephone Encounter (Unsigned)
Copied from [place] [id number]. Topic: Referral - Status >> Jul 07, 2018  3:01 PM Yvette Rack wrote: Reason for CRM: Pt daughter called in stating they have not been contacted regarding the referral. Pt daughter stated pt is unable to walk and they need to be contacted as soon as possible to get an appt scheduled.

## 2018-07-10 ENCOUNTER — Ambulatory Visit: Payer: Self-pay | Admitting: *Deleted

## 2018-07-10 ENCOUNTER — Ambulatory Visit (HOSPITAL_COMMUNITY)
Admission: RE | Admit: 2018-07-10 | Discharge: 2018-07-10 | Disposition: A | Discharge status: Home / Self Care | Payer: Medicare Other | Source: Ambulatory Visit | Attending: Family Medicine | Admitting: Family Medicine

## 2018-07-10 DIAGNOSIS — M79604 Pain in right leg: Secondary | ICD-10-CM | POA: Diagnosis not present

## 2018-07-10 DIAGNOSIS — M7989 Other specified soft tissue disorders: Secondary | ICD-10-CM | POA: Diagnosis not present

## 2018-07-10 NOTE — Progress Notes (Signed)
Lower extremity venous duplex has been completed.   Preliminary results in CV Proc.   Abram Sander 07/10/2018 3:10 PM

## 2018-07-10 NOTE — Telephone Encounter (Signed)
Message that is in referral from [facility]:  Needs right le venous reflux and new vasc apt w/ any MD

## 2018-07-10 NOTE — Telephone Encounter (Signed)
Please advise 

## 2018-07-10 NOTE — Telephone Encounter (Signed)
Patient was seen on 07/03/18 by Kathe Becton, NP at [facility].Lower Extremity Venous Doppler ordered for patient. Patient had the doppler performed today today as noted by CV Sonographer. [place] interpreter, Nicolletta explains that Mrs. Werden thinks the wrong test was performed today. Mrs. Edgin reports the sonographer explained to her that she was not assessing the overall circulation  But was assessing for blood clots only. We explained to Mrs. Portales that the venous doppler assessed the blood flow in her arteries and detects blood clots if any present. She did not understand the explanation and wants the physician to call her tomorrow with an explanation of the test. She will have her mobile phone on her all day tomorrow (Friday) at work. She is requesting the physician to call her, not a nurse, please. Routing to PCP for the physician.   Answer Assessment - Initial Assessment Questions 1. ONSET: "When did the pain start?"     2 weeks ago the pain started 2. LOCATION: "Where is the pain located?"      Both legs feels cold up to the knees. The pain is at the shins and up the outer aspects of both lower legs. 3. PAIN: "How bad is the pain?"    (Scale 1-10; or mild, moderate, severe)   -  MILD (1-3): doesn't interfere with normal activities    -  MODERATE (4-7): interferes with normal activities (e.g., work or school) or awakens from sleep, limping    -  SEVERE (8-10): excruciating pain, unable to do any normal activities, unable to walk     "very painful" 4. WORK OR EXERCISE: "Has there been any recent work or exercise that involved this part of the body?"      no 5. CAUSE: "What do you think is causing the leg pain?"     She wants the circulation checked  6. OTHER SYMPTOMS: "Do you have any other symptoms?" (e.g., chest pain, back pain, breathing difficulty, swelling, rash, fever, numbness, weakness)     Pain in her legs.  7. PREGNANCY: "Is there any chance you are pregnant?" "When was your last  menstrual period?"     no    No further triage performed. Patient wants to hear from physician, please.  Protocols used: LEG PAIN-A-AH

## 2018-07-14 ENCOUNTER — Telehealth: Payer: Self-pay | Admitting: Family Medicine

## 2018-07-14 ENCOUNTER — Other Ambulatory Visit: Payer: Self-pay | Admitting: Family Medicine

## 2018-07-14 DIAGNOSIS — M79604 Pain in right leg: Secondary | ICD-10-CM

## 2018-07-14 DIAGNOSIS — L97919 Non-pressure chronic ulcer of unspecified part of right lower leg with unspecified severity: Secondary | ICD-10-CM

## 2018-07-14 DIAGNOSIS — M7989 Other specified soft tissue disorders: Secondary | ICD-10-CM

## 2018-07-14 NOTE — Telephone Encounter (Signed)
Patient would like someone to call her back about her lab results from 07/03/18.  Patient also states she needs ibuprofen 800MG  called into the [facility] on [address]

## 2018-07-15 NOTE — Telephone Encounter (Signed)
Looks like Clovis Riley put in orders today.

## 2018-07-15 NOTE — Telephone Encounter (Signed)
Please see note below and see about urgent referral to vascular surgery looks like provider put in request?

## 2018-07-16 ENCOUNTER — Other Ambulatory Visit: Payer: Self-pay

## 2018-07-16 DIAGNOSIS — M79604 Pain in right leg: Secondary | ICD-10-CM

## 2018-07-16 MED ORDER — IBUPROFEN 800 MG PO TABS: 800.0000 mg | ORAL_TABLET | Freq: Three times a day (TID) | ORAL | 2 refills | Status: DC | PRN

## 2018-07-16 NOTE — Telephone Encounter (Signed)
Sent in RX to new pharmacy but needs labs reviewed.

## 2018-07-17 ENCOUNTER — Telehealth: Payer: Self-pay | Admitting: Family Medicine

## 2018-07-17 NOTE — Telephone Encounter (Signed)
Copied from [place] [id number]. Topic: Referral - Request for Referral >> Jul 17, 2018  3:52 PM Ivar Drape wrote: Has patient seen PCP for this complaint?  YES *If NO, is insurance requiring patient see PCP for this issue before PCP can refer them? Referral for which specialty:   Ultrasound Preferred provider/office:  None Reason for referral:  Pain in legs

## 2018-07-18 NOTE — Progress Notes (Signed)
I have called the pt back via the interpreter services and informed her of the providers message below along with answering many of her questions. She also has an US of the leg appointment late April and got on the cancelation list so she can have it done sooner if a slot opens up.

## 2018-07-24 DIAGNOSIS — M25572 Pain in left ankle and joints of left foot: Secondary | ICD-10-CM | POA: Diagnosis not present

## 2018-07-24 DIAGNOSIS — M25571 Pain in right ankle and joints of right foot: Secondary | ICD-10-CM | POA: Diagnosis not present

## 2018-07-28 NOTE — Telephone Encounter (Signed)
Please advise 

## 2018-07-31 ENCOUNTER — Ambulatory Visit: Payer: Medicare Other | Admitting: Family Medicine

## 2018-08-19 ENCOUNTER — Other Ambulatory Visit: Payer: Self-pay

## 2018-08-19 DIAGNOSIS — I824Y9 Acute embolism and thrombosis of unspecified deep veins of unspecified proximal lower extremity: Secondary | ICD-10-CM

## 2018-08-19 DIAGNOSIS — L97919 Non-pressure chronic ulcer of unspecified part of right lower leg with unspecified severity: Secondary | ICD-10-CM

## 2018-09-01 ENCOUNTER — Encounter (HOSPITAL_COMMUNITY): Payer: Medicare Other

## 2018-09-01 ENCOUNTER — Encounter: Payer: Medicare Other | Admitting: Surgery

## 2018-10-07 ENCOUNTER — Ambulatory Visit: Payer: Self-pay | Admitting: Hematology

## 2018-10-07 NOTE — Telephone Encounter (Signed)
Sees Kathe Becton and PCP. / Interpreter: [id number] / Not able to call patient from [facility], as there was bad interference and she could not hear me. / Blocked number and called from my cell phone /  Patient states she has been taking a dog out every hour and her shoes were wet.  Now she has a sore throat / Patient states she has been taking tylenol with no improvement / Called FC and transferred call. /  Reason for Disposition . [1] Sore throat is the only symptom AND [2] present > 48 hours  Answer Assessment - Initial Assessment Questions 1. ONSET: "When did the throat start hurting?" (Hours or days ago)      A few days ago 2. SEVERITY: "How bad is the sore throat?" (Scale 1-10; mild, moderate or severe)   - MILD (1-3):  doesn't interfere with eating or normal activities   - MODERATE (4-7): interferes with eating some solids and normal activities   - SEVERE (8-10):  excruciating pain, interferes with most normal activities   - SEVERE DYSPHAGIA: can't swallow liquids, drooling interferes with eating and drinking. Pain about 8  3. STREP EXPOSURE: "Has there been any exposure to strep within the past week?" If so, ask: "What type of contact occurred?"  No 4.  VIRAL SYMPTOMS: "Are there any symptoms of a cold, such as a runny nose, cough, hoarse voice or red eyes?"     no 5. FEVER: "Do you have a fever?" If so, ask: "What is your temperature, how was it measured, and when did it start?" no 6. PUS ON THE TONSILS: "Is there pus on the tonsils in the back of your throat?" no 7. OTHER SYMPTOMS: "Do you have any other symptoms?" (e.g., difficulty breathing, headache, rash) Sore throat when she swallows and eats  Protocols used: SORE THROAT-A-AH

## 2018-10-08 ENCOUNTER — Telehealth (INDEPENDENT_AMBULATORY_CARE_PROVIDER_SITE_OTHER): Payer: Medicare Other | Admitting: Family Medicine

## 2018-10-08 ENCOUNTER — Other Ambulatory Visit: Payer: Self-pay

## 2018-10-08 ENCOUNTER — Encounter: Payer: Self-pay | Admitting: Family Medicine

## 2018-10-08 VITALS — Ht 62.21 in | Wt 159.0 lb

## 2018-10-08 DIAGNOSIS — J029 Acute pharyngitis, unspecified: Secondary | ICD-10-CM | POA: Diagnosis not present

## 2018-10-08 MED ORDER — AMOXICILLIN 500 MG PO CAPS: 500.0000 mg | ORAL_CAPSULE | Freq: Two times a day (BID) | ORAL | 0 refills | Status: DC

## 2018-10-08 NOTE — Progress Notes (Signed)
Pt c/o having pain below tonsils and has been hurting for three days. Pt states she has not had a fever but couldn't tell me her temperature.

## 2018-10-08 NOTE — Progress Notes (Signed)
Telemedicine Encounter- SOAP NOTE Established Patient  I discussed the limitations, risks, security and privacy concerns of performing an evaluation and management service by telephone and the availability of in person appointments. I also discussed with the patient that there may be a patient responsible charge related to this service. The patient expressed understanding and agreed to proceed.  This telephone encounter was conducted with the patient's (via interpreter)verbal consent via audio telecommunications: yes Patient was instructed to have this encounter in a suitably private space; and to only have persons present to whom they give permission to participate. In addition, patient identity was confirmed by use of name plus two identifiers (DOB and address).  I spent a total of 27min talking with the patient or their proxy.  Cc: Pt c/o having pain below tonsils and has been hurting for three days. Pt states she has not had a fever but couldn't tell me her temperature.           Shelia Berg is a 68 y.o. female established patient. Telephone visit today for sore throat  HPI 3 days bilat tonsils hurting Pt with sore throat 6/19 and given IM PEN-culture negative No Fever NO Difficulty swallowing NO Difficulty eating NO cough or SOB No h/o acute illness in the home   Patient Active Problem List   Diagnosis Date Noted  . Ulcer of right lower extremity ([place]) 07/03/2018  . Cough 12/20/2016  . Acute bronchitis 12/20/2016  . Pleuritic chest pain 03/05/2016  . Pleurisy with effusion 03/05/2016  . Tobacco dependence in remission 03/05/2016  . Hyperkalemia 08/04/2015  . Hypokalemia 06/07/2014  . Osteoarthritis 10/03/2006  . FRACTURE, WRIST, LEFT 05/20/2001  . HX, PERSONAL, VENOUS THROMBOSIS/EMBOLISM 05/15/1987  . TOTAL ABDOMINAL HYSTERECTOMY, HX OF 05/14/1986    Past Medical History:  Diagnosis Date  . Arthritis   . Bladder cancer ([place]) 6 years ago  . History of DVT  (deep vein thrombosis)   . Infected traumatic leg ulcer, left, limited to breakdown of skin [facility])     Current Outpatient Medications  Medication Sig Dispense Refill  . BREO ELLIPTA 100-25 MCG/INH AEPB Inhale 1 application into the lungs daily.  5  . aspirin EC 81 MG tablet Take 81 mg by mouth daily.    [name] ibuprofen (ADVIL,MOTRIN) 800 MG tablet Take 1 tablet (800 mg total) by mouth every 8 (eight) hours as needed. (Patient not taking: Reported on 10/08/2018) 30 tablet 2  . meloxicam (MOBIC) 7.5 MG tablet Take 1 tablet by mouth daily.     No current facility-administered medications for this visit.     Allergies  Allergen Reactions  . Contrast Media [Iodinated Diagnostic Agents] Anaphylaxis    RESPIRATORY ARREST/Patient stopped breathing    Social History   Socioeconomic History  . Marital status: Married    Spouse name: Not on file  . Number of children: Not on file  . Years of education: Not on file  . Highest education level: Not on file  Occupational History  . Not on file  Social Needs  . Financial resource strain: Not on file  . Food insecurity:    Worry: Not on file    Inability: Not on file  . Transportation needs:    Medical: Not on file    Non-medical: Not on file  Tobacco Use  . Smoking status: Former Smoker    Packs/day: 0.50    Years: 30.00    Pack years: 15.00    Types: Cigarettes  Last attempt to quit: 06/05/2014    Years since quitting: 4.3  . Smokeless tobacco: Never Used  Substance and Sexual Activity  . Alcohol use: No    Alcohol/week: 0.0 standard drinks  . Drug use: No  . Sexual activity: Yes  Lifestyle  . Physical activity:    Days per week: Not on file    Minutes per session: Not on file  . Stress: Not on file  Relationships  . Social connections:    Talks on phone: Not on file    Gets together: Not on file    Attends religious service: Not on file    Active member of club or organization: Not on file    Attends meetings of clubs or  organizations: Not on file    Relationship status: Not on file  . Intimate partner violence:    Fear of current or ex partner: Not on file    Emotionally abused: Not on file    Physically abused: Not on file    Forced sexual activity: Not on file  Other Topics Concern  . Not on file  Social History Narrative  . Not on file    Review of Systems  Constitutional: Negative for chills, fever and malaise/fatigue.  HENT: Positive for sore throat. Negative for sinus pain.   Respiratory: Negative for cough.   Cardiovascular: Negative for chest pain.  Gastrointestinal: Negative for diarrhea, nausea and vomiting.    Objective   Vitals as reported by the patient: Today's Vitals   10/08/18 1043  Weight: 159 lb (72.1 kg)  Height: 5' 2.21" (1.58 m)   1. Acute pharyngitis, unspecified etiology  Amoxil-rx -sent to pharmacy-pt given IM PEN 6/19 with no concerns-request antibiotics due to similar presentation and concern for in clinic visit due to [place]. Pt will call if further symptoms of concern and schedule appt for f/u if no resolution. Increase fluids suggested  I discussed the assessment and treatment plan with the patient. The patient was provided an opportunity to ask questions and all were answered. The patient agreed with the plan and demonstrated an understanding of the instructions.   The patient was advised to call back or seek an in-person evaluation if the symptoms worsen or if the condition fails to improve as anticipated.  I provided 10 minutes of non-face-to-face time during this encounter.  Kareema Keitt Hannah Beat, MD  Primary Care at [facility] 10-08-18

## 2018-10-10 ENCOUNTER — Telehealth: Payer: Self-pay | Admitting: Family Medicine

## 2018-10-10 NOTE — Telephone Encounter (Signed)
Copied from [place] [phone]. Topic: General - Other >> Oct 10, 2018  1:56 PM Erick Blinks wrote: Reason for CRM: Pt called to report successful results from most recently prescribed medication. Pt communicate that she was instructed to call back and deliver report, and her report is she is feeling much better.

## 2018-10-14 DIAGNOSIS — J9811 Atelectasis: Secondary | ICD-10-CM | POA: Diagnosis not present

## 2018-10-14 DIAGNOSIS — Z03818 Encounter for observation for suspected exposure to other biological agents ruled out: Secondary | ICD-10-CM | POA: Diagnosis not present

## 2018-10-14 DIAGNOSIS — C679 Malignant neoplasm of bladder, unspecified: Secondary | ICD-10-CM | POA: Insufficient documentation

## 2018-10-14 DIAGNOSIS — R05 Cough: Secondary | ICD-10-CM | POA: Diagnosis not present

## 2018-10-14 DIAGNOSIS — R0602 Shortness of breath: Secondary | ICD-10-CM | POA: Diagnosis not present

## 2018-11-24 DIAGNOSIS — R918 Other nonspecific abnormal finding of lung field: Secondary | ICD-10-CM | POA: Diagnosis not present

## 2018-11-24 DIAGNOSIS — Z87891 Personal history of nicotine dependence: Secondary | ICD-10-CM | POA: Diagnosis not present

## 2018-11-24 DIAGNOSIS — R071 Chest pain on breathing: Secondary | ICD-10-CM | POA: Diagnosis not present

## 2018-11-24 DIAGNOSIS — J449 Chronic obstructive pulmonary disease, unspecified: Secondary | ICD-10-CM | POA: Diagnosis not present

## 2019-01-13 DIAGNOSIS — L820 Inflamed seborrheic keratosis: Secondary | ICD-10-CM | POA: Diagnosis not present

## 2019-01-20 DIAGNOSIS — C679 Malignant neoplasm of bladder, unspecified: Secondary | ICD-10-CM | POA: Diagnosis not present

## 2019-01-20 DIAGNOSIS — B028 Zoster with other complications: Secondary | ICD-10-CM | POA: Diagnosis not present

## 2019-01-21 ENCOUNTER — Telehealth: Payer: Self-pay | Admitting: Family Medicine

## 2019-01-21 NOTE — Telephone Encounter (Signed)
Called pt LVM for her to call us back to schedule appt to get referral for [facility]

## 2019-01-22 ENCOUNTER — Other Ambulatory Visit: Payer: Self-pay

## 2019-01-22 ENCOUNTER — Encounter: Payer: Self-pay | Admitting: Family Medicine

## 2019-01-22 ENCOUNTER — Ambulatory Visit (INDEPENDENT_AMBULATORY_CARE_PROVIDER_SITE_OTHER): Payer: Medicare Other | Admitting: Family Medicine

## 2019-01-22 VITALS — BP 135/76 | HR 78 | Temp 98.7°F | Resp 16 | Wt 176.0 lb

## 2019-01-22 DIAGNOSIS — B029 Zoster without complications: Secondary | ICD-10-CM | POA: Diagnosis not present

## 2019-01-22 MED ORDER — VALACYCLOVIR HCL 1 G PO TABS: 1000.0000 mg | ORAL_TABLET | Freq: Three times a day (TID) | ORAL | 0 refills | Status: DC

## 2019-01-22 MED ORDER — PREDNISONE 20 MG PO TABS: ORAL_TABLET | ORAL | 0 refills | Status: DC

## 2019-01-22 MED ORDER — HYDROCODONE-ACETAMINOPHEN 7.5-325 MG PO TABS: ORAL_TABLET | ORAL | 0 refills | Status: DC

## 2019-01-22 NOTE — Progress Notes (Signed)
Patient ID: Shelia Berg, female    DOB: 03/02/1951  Age: 68 y.o. MRN: [id number]  Chief Complaint  Patient presents with  . Rash    burning rash on back and under arms     Subjective:   Patient has recently taken a course of prednisone for her arthritis.  She sees a doctor in [place] for that.  Then right before the weekend she started breaking out with a rash on her left side of her back and around to the left breast area.  She had to tide through the holiday weekend, but Tuesday was placed by her primary care by phone on valacyclovir.  She comes on in today because of having so much problems and pain.  She has not had a shingles shot.  She does not have any immune compromising condition that we know of.  Current allergies, medications, problem list, past/family and social histories reviewed.  Objective:  BP 135/76   Pulse 78   Temp 98.7 F (37.1 C) (Oral)   Resp 16   Wt 176 lb (79.8 kg)   SpO2 97%   BMI 31.97 kg/m   Very severe outbreak of the shingles rash from just to the right of her spine extending all the way around to the sternum just above her breast.  It looks like about a T2 distribution.  It is very inflamed with large blisters.  Assessment & Plan:   Assessment: 1. Herpes zoster without complication       Plan: This is a very bad case of shingles.  See my instructions.  No orders of the defined types were placed in this encounter.   No orders of the defined types were placed in this encounter.        Patient Instructions   This is a very bad case of shingles involving the T2 dermatome.  It will take a long time for the skin to heal up completely.  Even after the skin is healed, the pain may persist because the infection often damages the nerves.  This is caused by the same virus that causes chickenpox.  Several months after you are doing better you might consider still getting a shingles vaccination, because people can get repeat episodes of  this rarely.  Continue taking the valacyclovir (Valtrex) 1000 mg 3 times daily.  Standard course is for 1 week, but sometimes it is used for 2 weeks with a little added benefit.  I advise you to continue it for the full 2 weeks because of the severe nature of your problem.  Take the prednisone 20 mg 3 pills each morning after breakfast for 3 days, then 2 pills daily for 3 days, then 1 pill daily for 3 days, then 1/2 pill daily until it is used up.  Although the use of prednisone is debated, it probably does give some added benefit when you have this much is inflammation.  Take the hydrocodone pain pills (Norco) 7.5 mg 1 every 4-6 hours for severe pain only.  The Norco does have some Tylenol combined with it (acetaminophen).  The maximum amount of acetaminophen is 3000 mg in 24 hours.  If you take Tylenol in addition to the [place], you need to keep yourself a record of how much total acetaminophen you are taking.  Each Norco (hydrocodone) pill has 325 mg in it.  You can also take ibuprofen 200 mg 2 or 3 pills 3 times daily if needed for additional pain relief.  All of this may help  some, but you still going to have to live with a good deal of pain until this has calmed down.  Skin creams do not seem to help a lot, except for one called Zostrix which you can use only after all the blisters are gone.  Wear loose clothing.  Recheck if you are having further problems.  If pain persists after the blisters have cleared up, sometimes a another type of medication that treats nerve pain can be helpful (Neurontin) it is not helpful at this initial stage.   Shingles  Shingles, which is also known as herpes zoster, is an infection that causes a painful skin rash and fluid-filled blisters. It is caused by a virus. Shingles only develops in people who:  Have had chickenpox.  Have been given a medicine to protect against chickenpox (have been vaccinated). Shingles is rare in this group. What are the  causes? Shingles is caused by varicella-zoster virus (VZV). This is the same virus that causes chickenpox. After a person is exposed to VZV, the virus stays in the body in an inactive (dormant) state. Shingles develops if the virus is reactivated. This can happen many years after the first (initial) exposure to VZV. It is not known what causes this virus to be reactivated. What increases the risk? People who have had chickenpox or received the chickenpox vaccine are at risk for shingles. Shingles infection is more common in people who:  Are older than age 71.  Have a weakened disease-fighting system (immune system), such as people with: ? HIV. ? AIDS. ? Cancer.  Are taking medicines that weaken the immune system, such as transplant medicines.  Are experiencing a lot of stress. What are the signs or symptoms? Early symptoms of this condition include itching, tingling, and pain in an area on your skin. Pain may be described as burning, stabbing, or throbbing. A few days or weeks after early symptoms start, a painful red rash appears. The rash is usually on one side of the body and has a band-like or belt-like pattern. The rash eventually turns into fluid-filled blisters that break open, change into scabs, and dry up in about 2-3 weeks. At any time during the infection, you may also develop:  A fever.  Chills.  A headache.  An upset stomach. How is this diagnosed? This condition is diagnosed with a skin exam. Skin or fluid samples may be taken from the blisters before a diagnosis is made. These samples are examined under a microscope or sent to a lab for testing. How is this treated? The rash may last for several weeks. There is not a specific cure for this condition. Your health care provider will probably prescribe medicines to help you manage pain, recover more quickly, and avoid long-term problems. Medicines may include:  Antiviral drugs.  Anti-inflammatory drugs.  Pain  medicines.  Anti-itching medicines (antihistamines). If the area involved is on your face, you may be referred to a specialist, such as an eye doctor (ophthalmologist) or an ear, nose, and throat (ENT) doctor (otolaryngologist) to help you avoid eye problems, chronic pain, or disability. Follow these instructions at home: Medicines  Take over-the-counter and prescription medicines only as told by your health care provider.  Apply an anti-itch cream or numbing cream to the affected area as told by your health care provider. Relieving itching and discomfort   Apply cold, wet cloths (cold compresses) to the area of the rash or blisters as told by your health care provider.  Cool baths can  be soothing. Try adding baking soda or dry oatmeal to the water to reduce itching. Do not bathe in hot water. Blister and rash care  Keep your rash covered with a loose bandage (dressing). Wear loose-fitting clothing to help ease the pain of material rubbing against the rash.  Keep your rash and blisters clean by washing the area with mild soap and cool water as told by your health care provider.  Check your rash every day for signs of infection. Check for: ? More redness, swelling, or pain. ? Fluid or blood. ? Warmth. ? Pus or a bad smell.  Do not scratch your rash or pick at your blisters. To help avoid scratching: ? Keep your fingernails clean and cut short. ? Wear gloves or mittens while you sleep, if scratching is a problem. General instructions  Rest as told by your health care provider.  Keep all follow-up visits as told by your health care provider. This is important.  Wash your hands often with soap and water. If soap and water are not available, use hand sanitizer. Doing this lowers your chance of getting a bacterial skin infection.  Before your blisters change into scabs, your shingles infection can cause chickenpox in people who have never had it or have never been vaccinated against  it. To prevent this from happening, avoid contact with other people, especially: ? Babies. ? Pregnant women. ? Children who have eczema. ? Elderly people who have transplants. ? People who have chronic illnesses, such as cancer or AIDS. Contact a health care provider if:  Your pain is not relieved with prescribed medicines.  Your pain does not get better after the rash heals.  You have signs of infection in the rash area, such as: ? More redness, swelling, or pain around the rash. ? Fluid or blood coming from the rash. ? The rash area feeling warm to the touch. ? Pus or a bad smell coming from the rash. Get help right away if:  The rash is on your face or nose.  You have facial pain, pain around your eye area, or loss of feeling on one side of your face.  You have difficulty seeing.  You have ear pain or have ringing in your ear.  You have a loss of taste.  Your condition gets worse. Summary  Shingles, which is also known as herpes zoster, is an infection that causes a painful skin rash and fluid-filled blisters.  This condition is diagnosed with a skin exam. Skin or fluid samples may be taken from the blisters and examined before the diagnosis is made.  Keep your rash covered with a loose bandage (dressing). Wear loose-fitting clothing to help ease the pain of material rubbing against the rash.  Before your blisters change into scabs, your shingles infection can cause chickenpox in people who have never had it or have never been vaccinated against it. This information is not intended to replace advice given to you by your health care provider. Make sure you discuss any questions you have with your health care provider. Document Released: 04/30/2005 Document Revised: 08/22/2018 Document Reviewed: 01/02/2017 Elsevier Patient Education  [employer].     If you have lab work done today you will be contacted with your lab results within the next 2 weeks.  If you have  not heard from [place] then please contact us. The fastest way to get your results is to register for My Chart.   IF you received an x-ray today, you  will receive an invoice from [facility]. Please contact [facility] at [phone] with questions or concerns regarding your invoice.   IF you received labwork today, you will receive an invoice from [facility]. Please contact [facility] at [phone] with questions or concerns regarding your invoice.   Our billing staff will not be able to assist you with questions regarding bills from these companies.  You will be contacted with the lab results as soon as they are available. The fastest way to get your results is to activate your My Chart account. Instructions are located on the last page of this paperwork. If you have not heard from [place] regarding the results in 2 weeks, please contact this office.    '    Return if symptoms worsen or fail to improve.   Ruben Reason, MD 01/22/2019

## 2019-01-22 NOTE — Patient Instructions (Addendum)
This is a very bad case of shingles involving the T2 dermatome.  It will take a long time for the skin to heal up completely.  Even after the skin is healed, the pain may persist because the infection often damages the nerves.  This is caused by the same virus that causes chickenpox.  Several months after you are doing better you might consider still getting a shingles vaccination, because people can get repeat episodes of this rarely.  Continue taking the valacyclovir (Valtrex) 1000 mg 3 times daily.  Standard course is for 1 week, but sometimes it is used for 2 weeks with a little added benefit.  I advise you to continue it for the full 2 weeks because of the severe nature of your problem.  Take the prednisone 20 mg 3 pills each morning after breakfast for 3 days, then 2 pills daily for 3 days, then 1 pill daily for 3 days, then 1/2 pill daily until it is used up.  Although the use of prednisone is debated, it probably does give some added benefit when you have this much is inflammation.  Take the hydrocodone pain pills (Norco) 7.5 mg 1 every 4-6 hours for severe pain only.  The Norco does have some Tylenol combined with it (acetaminophen).  The maximum amount of acetaminophen is 3000 mg in 24 hours.  If you take Tylenol in addition to the [place], you need to keep yourself a record of how much total acetaminophen you are taking.  Each Norco (hydrocodone) pill has 325 mg in it.  You can also take ibuprofen 200 mg 2 or 3 pills 3 times daily if needed for additional pain relief.  All of this may help some, but you still going to have to live with a good deal of pain until this has calmed down.  Skin creams do not seem to help a lot, except for one called Zostrix which you can use only after all the blisters are gone.  Wear loose clothing.  Recheck if you are having further problems.  If pain persists after the blisters have cleared up, sometimes a another type of medication that treats nerve pain  can be helpful (Neurontin) it is not helpful at this initial stage.   Shingles  Shingles, which is also known as herpes zoster, is an infection that causes a painful skin rash and fluid-filled blisters. It is caused by a virus. Shingles only develops in people who:  Have had chickenpox.  Have been given a medicine to protect against chickenpox (have been vaccinated). Shingles is rare in this group. What are the causes? Shingles is caused by varicella-zoster virus (VZV). This is the same virus that causes chickenpox. After a person is exposed to VZV, the virus stays in the body in an inactive (dormant) state. Shingles develops if the virus is reactivated. This can happen many years after the first (initial) exposure to VZV. It is not known what causes this virus to be reactivated. What increases the risk? People who have had chickenpox or received the chickenpox vaccine are at risk for shingles. Shingles infection is more common in people who:  Are older than age 110.  Have a weakened disease-fighting system (immune system), such as people with: ? HIV. ? AIDS. ? Cancer.  Are taking medicines that weaken the immune system, such as transplant medicines.  Are experiencing a lot of stress. What are the signs or symptoms? Early symptoms of this condition include itching, tingling, and pain in an area  on your skin. Pain may be described as burning, stabbing, or throbbing. A few days or weeks after early symptoms start, a painful red rash appears. The rash is usually on one side of the body and has a band-like or belt-like pattern. The rash eventually turns into fluid-filled blisters that break open, change into scabs, and dry up in about 2-3 weeks. At any time during the infection, you may also develop:  A fever.  Chills.  A headache.  An upset stomach. How is this diagnosed? This condition is diagnosed with a skin exam. Skin or fluid samples may be taken from the blisters before a  diagnosis is made. These samples are examined under a microscope or sent to a lab for testing. How is this treated? The rash may last for several weeks. There is not a specific cure for this condition. Your health care provider will probably prescribe medicines to help you manage pain, recover more quickly, and avoid long-term problems. Medicines may include:  Antiviral drugs.  Anti-inflammatory drugs.  Pain medicines.  Anti-itching medicines (antihistamines). If the area involved is on your face, you may be referred to a specialist, such as an eye doctor (ophthalmologist) or an ear, nose, and throat (ENT) doctor (otolaryngologist) to help you avoid eye problems, chronic pain, or disability. Follow these instructions at home: Medicines  Take over-the-counter and prescription medicines only as told by your health care provider.  Apply an anti-itch cream or numbing cream to the affected area as told by your health care provider. Relieving itching and discomfort   Apply cold, wet cloths (cold compresses) to the area of the rash or blisters as told by your health care provider.  Cool baths can be soothing. Try adding baking soda or dry oatmeal to the water to reduce itching. Do not bathe in hot water. Blister and rash care  Keep your rash covered with a loose bandage (dressing). Wear loose-fitting clothing to help ease the pain of material rubbing against the rash.  Keep your rash and blisters clean by washing the area with mild soap and cool water as told by your health care provider.  Check your rash every day for signs of infection. Check for: ? More redness, swelling, or pain. ? Fluid or blood. ? Warmth. ? Pus or a bad smell.  Do not scratch your rash or pick at your blisters. To help avoid scratching: ? Keep your fingernails clean and cut short. ? Wear gloves or mittens while you sleep, if scratching is a problem. General instructions  Rest as told by your health care  provider.  Keep all follow-up visits as told by your health care provider. This is important.  Wash your hands often with soap and water. If soap and water are not available, use hand sanitizer. Doing this lowers your chance of getting a bacterial skin infection.  Before your blisters change into scabs, your shingles infection can cause chickenpox in people who have never had it or have never been vaccinated against it. To prevent this from happening, avoid contact with other people, especially: ? Babies. ? Pregnant women. ? Children who have eczema. ? Elderly people who have transplants. ? People who have chronic illnesses, such as cancer or AIDS. Contact a health care provider if:  Your pain is not relieved with prescribed medicines.  Your pain does not get better after the rash heals.  You have signs of infection in the rash area, such as: ? More redness, swelling, or pain around the  rash. ? Fluid or blood coming from the rash. ? The rash area feeling warm to the touch. ? Pus or a bad smell coming from the rash. Get help right away if:  The rash is on your face or nose.  You have facial pain, pain around your eye area, or loss of feeling on one side of your face.  You have difficulty seeing.  You have ear pain or have ringing in your ear.  You have a loss of taste.  Your condition gets worse. Summary  Shingles, which is also known as herpes zoster, is an infection that causes a painful skin rash and fluid-filled blisters.  This condition is diagnosed with a skin exam. Skin or fluid samples may be taken from the blisters and examined before the diagnosis is made.  Keep your rash covered with a loose bandage (dressing). Wear loose-fitting clothing to help ease the pain of material rubbing against the rash.  Before your blisters change into scabs, your shingles infection can cause chickenpox in people who have never had it or have never been vaccinated against it. This  information is not intended to replace advice given to you by your health care provider. Make sure you discuss any questions you have with your health care provider. Document Released: 04/30/2005 Document Revised: 08/22/2018 Document Reviewed: 01/02/2017 Elsevier Patient Education  [employer].     If you have lab work done today you will be contacted with your lab results within the next 2 weeks.  If you have not heard from [place] then please contact us. The fastest way to get your results is to register for My Chart.   IF you received an x-ray today, you will receive an invoice from [facility]. Please contact [facility] Radiology at [phone] with questions or concerns regarding your invoice.   IF you received labwork today, you will receive an invoice from [facility]. Please contact [facility] at [phone] with questions or concerns regarding your invoice.   Our billing staff will not be able to assist you with questions regarding bills from these companies.  You will be contacted with the lab results as soon as they are available. The fastest way to get your results is to activate your My Chart account. Instructions are located on the last page of this paperwork. If you have not heard from [place] regarding the results in 2 weeks, please contact this office.    '

## 2019-02-02 ENCOUNTER — Telehealth: Payer: Self-pay | Admitting: Family Medicine

## 2019-02-02 NOTE — Telephone Encounter (Signed)
Pt was seen in clinic on 01/22/19 by Dr. Linna Darner for shingles. She is still having a lot of itching and pain. She would like [place] to send over a script to help with this

## 2019-02-02 NOTE — Telephone Encounter (Signed)
Issue.  Pharmacy:  [id number] - [place], [place]  [place] #[id number]   Please advise at [phone]

## 2019-02-03 NOTE — Telephone Encounter (Signed)
Pt called again requesting valacyclovir or another prescription that will help with the pain. Pt refused f/u appt. Please advise

## 2019-02-04 ENCOUNTER — Other Ambulatory Visit: Payer: Self-pay

## 2019-02-04 DIAGNOSIS — B029 Zoster without complications: Secondary | ICD-10-CM

## 2019-02-04 NOTE — Telephone Encounter (Signed)
Requested Prescriptions   Pending Prescriptions Disp Refills  . valACYclovir (VALTREX) 1000 MG tablet 21 tablet 0    Sig: Take 1 tablet (1,000 mg total) by mouth 3 (three) times daily.    Last Ov 01/22/2019  Last written 01/21/2009

## 2019-03-10 ENCOUNTER — Telehealth: Payer: Self-pay | Admitting: General Practice

## 2019-03-10 NOTE — Telephone Encounter (Signed)
Pt called back said someone called her and told her to call back

## 2019-03-10 NOTE — Telephone Encounter (Signed)
Pt asking for a referral for a mammogram. Did not want to make appt. Please advise

## 2019-03-13 ENCOUNTER — Other Ambulatory Visit: Payer: Self-pay

## 2019-03-13 ENCOUNTER — Telehealth (INDEPENDENT_AMBULATORY_CARE_PROVIDER_SITE_OTHER): Payer: Medicare Other | Admitting: Registered Nurse

## 2019-03-13 VITALS — BP 420/70 | Wt 160.0 lb

## 2019-03-13 DIAGNOSIS — Z1231 Encounter for screening mammogram for malignant neoplasm of breast: Secondary | ICD-10-CM

## 2019-03-13 NOTE — Progress Notes (Signed)
CC:Pt would like a referral for mammogram due to pain under her left underarm. Pt had shingles 7 weeks ago and unsure if the pain is from the shingles or breast pain. She states she has been taking tylenol every 8 hours for the pain.

## 2019-03-13 NOTE — Progress Notes (Signed)
Telemedicine Encounter- SOAP NOTE Established Patient  This telephone encounter was conducted with the patient's (or proxy's) verbal consent via audio telecommunications: yes  Patient was instructed to have this encounter in a suitably private space; and to only have persons present to whom they give permission to participate. In addition, patient identity was confirmed by use of name plus two identifiers (DOB and address).  I discussed the limitations, risks, security and privacy concerns of performing an evaluation and management service by telephone and the availability of in person appointments. I also discussed with the patient that there may be a patient responsible charge related to this service. The patient expressed understanding and agreed to proceed.  I spent a total of 14 minutes talking with the patient or their proxy.  No chief complaint on file.   Subjective   Shelia Berg is a 68 y.o. established patient. Telephone visit today for breast tenderness  HPI Pt notes that 7 weeks ago, she was dx with shingles in our office by Dr. Linna Darner. Notes that the vesicles have since resolved, but the skin had peeled. Where the skin is recovering, there is redness, warmth, and along the lateral edge of the L breast extending towards her L axilla, tenderness to palpation. There is no crusting of the skin, palpable mass, nipple discharge, new veins, numbness weakness tingling in L arm.  Has no mammo hx.   Patient Active Problem List   Diagnosis Date Noted  . Acute pharyngitis 10/08/2018  . Ulcer of right lower extremity ([place]) 07/03/2018  . Cough 12/20/2016  . Acute bronchitis 12/20/2016  . Pleuritic chest pain 03/05/2016  . Pleurisy with effusion 03/05/2016  . Tobacco dependence in remission 03/05/2016  . Hyperkalemia 08/04/2015  . Hypokalemia 06/07/2014  . Osteoarthritis 10/03/2006  . FRACTURE, WRIST, LEFT 05/20/2001  . HX, PERSONAL, VENOUS THROMBOSIS/EMBOLISM 05/15/1987  . TOTAL  ABDOMINAL HYSTERECTOMY, HX OF 05/14/1986    Past Medical History:  Diagnosis Date  . Arthritis   . Bladder cancer ([place]) 6 years ago  . History of DVT (deep vein thrombosis)   . Infected traumatic leg ulcer, left, limited to breakdown of skin [facility])     Current Outpatient Medications  Medication Sig Dispense Refill  . aspirin EC 81 MG tablet Take 81 mg by mouth daily.    [name] BREO ELLIPTA 100-25 MCG/INH AEPB Inhale 1 application into the lungs daily.  5  . HYDROcodone-acetaminophen (NORCO) 7.5-325 MG tablet Take 1 every 4-6 hours as needed for severe pain only 30 tablet 0  . ibuprofen (ADVIL,MOTRIN) 800 MG tablet Take 1 tablet (800 mg total) by mouth every 8 (eight) hours as needed. 30 tablet 2  . meloxicam (MOBIC) 7.5 MG tablet Take 1 tablet by mouth daily.    . methylPREDNISolone (MEDROL DOSEPAK) 4 MG TBPK tablet TAKE 6 TABLETS ON DAY 1 AS DIRECTED ON PACKAGE AND DECREASE BY 1 TAB EACH DAY FOR A TOTAL OF 6 DAYS    . mupirocin ointment (BACTROBAN) 2 % APPLY TO AFFECTED AREA TWICE A DAY    . predniSONE (DELTASONE) 20 MG tablet Take 3 daily for 3 days, then 2 daily for 3 days, then 1 daily for 3 days, then one half daily 20 tablet 0  . valACYclovir (VALTREX) 1000 MG tablet Take 1 tablet (1,000 mg total) by mouth 3 (three) times daily. 21 tablet 0  . amoxicillin (AMOXIL) 500 MG capsule Take 1 capsule (500 mg total) by mouth 2 (two) times daily. (Patient not taking: Reported  on 03/13/2019) 20 capsule 0   No current facility-administered medications for this visit.     Allergies  Allergen Reactions  . Contrast Media [Iodinated Diagnostic Agents] Anaphylaxis    RESPIRATORY ARREST/Patient stopped breathing    Social History   Socioeconomic History  . Marital status: Married    Spouse name: Not on file  . Number of children: Not on file  . Years of education: Not on file  . Highest education level: Not on file  Occupational History  . Not on file  Social Needs  . Financial resource  strain: Not on file  . Food insecurity    Worry: Not on file    Inability: Not on file  . Transportation needs    Medical: Not on file    Non-medical: Not on file  Tobacco Use  . Smoking status: Former Smoker    Packs/day: 0.50    Years: 30.00    Pack years: 15.00    Types: Cigarettes    Quit date: 06/05/2014    Years since quitting: 4.7  . Smokeless tobacco: Never Used  Substance and Sexual Activity  . Alcohol use: No    Alcohol/week: 0.0 standard drinks  . Drug use: No  . Sexual activity: Yes  Lifestyle  . Physical activity    Days per week: Not on file    Minutes per session: Not on file  . Stress: Not on file  Relationships  . Social Herbalist on phone: Not on file    Gets together: Not on file    Attends religious service: Not on file    Active member of club or organization: Not on file    Attends meetings of clubs or organizations: Not on file    Relationship status: Not on file  . Intimate partner violence    Fear of current or ex partner: Not on file    Emotionally abused: Not on file    Physically abused: Not on file    Forced sexual activity: Not on file  Other Topics Concern  . Not on file  Social History Narrative  . Not on file    ROS Per  hpi   Objective   Vitals as reported by the patient: Today's Vitals   03/13/19 1543  BP: (!) 420/70  Weight: 160 lb (72.6 kg)  Self - reported Vitals - this number is likely a mistranslation.   Diagnoses and all orders for this visit:  Encounter for screening mammogram for malignant neoplasm of breast -     MM Digital Screening; Future   PLAN  Ordered screening Mammogram  Discussed that this could be leftover lymphadenopathy or potentially a cutaneous infection in the healing skin, but this is difficult to tell without exam  Will follow up on results as warranted  Patient encouraged to call clinic with any questions, comments, or concerns.    I discussed the assessment and treatment  plan with the patient. The patient was provided an opportunity to ask questions and all were answered. The patient agreed with the plan and demonstrated an understanding of the instructions.   The patient was advised to call back or seek an in-person evaluation if the symptoms worsen or if the condition fails to improve as anticipated.  I provided 14 minutes of non-face-to-face time during this encounter.  Maximiano Coss, NP  Primary Care at [facility]

## 2019-03-19 NOTE — Telephone Encounter (Signed)
I suppose it could be either - I ordered this as a screening because this patient has never had mammography done before. But I would happily cosign a diagnostic order if [facility] would prefer.  Thank you  Kathrin Ruddy, NP

## 2019-03-19 NOTE — Telephone Encounter (Signed)
Rich would this mammogram be a diagnostic

## 2019-03-20 ENCOUNTER — Encounter: Payer: Self-pay | Admitting: Registered Nurse

## 2019-03-20 ENCOUNTER — Ambulatory Visit (INDEPENDENT_AMBULATORY_CARE_PROVIDER_SITE_OTHER): Payer: Medicare Other | Admitting: Registered Nurse

## 2019-03-20 ENCOUNTER — Other Ambulatory Visit: Payer: Self-pay

## 2019-03-20 VITALS — BP 126/75 | HR 80 | Temp 97.7°F | Resp 16 | Wt 181.0 lb

## 2019-03-20 DIAGNOSIS — B029 Zoster without complications: Secondary | ICD-10-CM

## 2019-03-20 DIAGNOSIS — Z23 Encounter for immunization: Secondary | ICD-10-CM

## 2019-03-20 MED ORDER — GABAPENTIN 300 MG PO CAPS: 300.0000 mg | ORAL_CAPSULE | Freq: Three times a day (TID) | ORAL | 3 refills | Status: AC

## 2019-03-20 NOTE — Progress Notes (Signed)
Acute Office Visit  Subjective:    Patient ID: Shelia Berg, female    DOB: 08/07/1950, 68 y.o.   MRN: [id number]  Chief Complaint  Patient presents with  . Herpes Zoster    pt states her shingles has returned on the back and her left arm, very painful     HPI Patient is in today for ongoing shingles  Was seen by Dr. Linna Darner in early September for what he labeled as a "very bad" case of shingles. She states today that the lesions are still present and giving her pain, despite having valtrex, norco, and prednisone. She has finished these medications.  Otherwise, feels well. Desires flu shot, but we stated that we won't give this to her unless she is feeling well.  Also due for mammogram, but needs lesions to heal first.  Past Medical History:  Diagnosis Date  . Arthritis   . Bladder cancer ([place]) 6 years ago  . History of DVT (deep vein thrombosis)   . Infected traumatic leg ulcer, left, limited to breakdown of skin [facility])     Past Surgical History:  Procedure Laterality Date  . ABDOMINAL HYSTERECTOMY      History reviewed. No pertinent family history.  Social History   Socioeconomic History  . Marital status: Married    Spouse name: Not on file  . Number of children: Not on file  . Years of education: Not on file  . Highest education level: Not on file  Occupational History  . Not on file  Social Needs  . Financial resource strain: Not on file  . Food insecurity    Worry: Not on file    Inability: Not on file  . Transportation needs    Medical: Not on file    Non-medical: Not on file  Tobacco Use  . Smoking status: Former Smoker    Packs/day: 0.50    Years: 30.00    Pack years: 15.00    Types: Cigarettes    Quit date: 06/05/2014    Years since quitting: 4.7  . Smokeless tobacco: Never Used  Substance and Sexual Activity  . Alcohol use: No    Alcohol/week: 0.0 standard drinks  . Drug use: No  . Sexual activity: Yes  Lifestyle  . Physical activity   Days per week: Not on file    Minutes per session: Not on file  . Stress: Not on file  Relationships  . Social Herbalist on phone: Not on file    Gets together: Not on file    Attends religious service: Not on file    Active member of club or organization: Not on file    Attends meetings of clubs or organizations: Not on file    Relationship status: Not on file  . Intimate partner violence    Fear of current or ex partner: Not on file    Emotionally abused: Not on file    Physically abused: Not on file    Forced sexual activity: Not on file  Other Topics Concern  . Not on file  Social History Narrative  . Not on file    Outpatient Medications Prior to Visit  Medication Sig Dispense Refill  . amoxicillin (AMOXIL) 500 MG capsule Take 1 capsule (500 mg total) by mouth 2 (two) times daily. 20 capsule 0  . aspirin EC 81 MG tablet Take 81 mg by mouth daily.    [name] BREO ELLIPTA 100-25 MCG/INH AEPB Inhale 1 application into the  lungs daily.  5  . ibuprofen (ADVIL,MOTRIN) 800 MG tablet Take 1 tablet (800 mg total) by mouth every 8 (eight) hours as needed. 30 tablet 2  . meloxicam (MOBIC) 7.5 MG tablet Take 1 tablet by mouth daily.    . mupirocin ointment (BACTROBAN) 2 % APPLY TO AFFECTED AREA TWICE A DAY    . valACYclovir (VALTREX) 1000 MG tablet Take 1 tablet (1,000 mg total) by mouth 3 (three) times daily. 21 tablet 0  . HYDROcodone-acetaminophen (NORCO) 7.5-325 MG tablet Take 1 every 4-6 hours as needed for severe pain only 30 tablet 0  . methylPREDNISolone (MEDROL DOSEPAK) 4 MG TBPK tablet TAKE 6 TABLETS ON DAY 1 AS DIRECTED ON PACKAGE AND DECREASE BY 1 TAB EACH DAY FOR A TOTAL OF 6 DAYS    . predniSONE (DELTASONE) 20 MG tablet Take 3 daily for 3 days, then 2 daily for 3 days, then 1 daily for 3 days, then one half daily 20 tablet 0   No facility-administered medications prior to visit.     Allergies  Allergen Reactions  . Contrast Media [Iodinated Diagnostic Agents]  Anaphylaxis    RESPIRATORY ARREST/Patient stopped breathing    ROS Per hpi    Objective:    Physical Exam  Skin:       BP 126/75   Pulse 80   Temp 97.7 F (36.5 C) (Oral)   Resp 16   Wt 181 lb (82.1 kg)   SpO2 100%   BMI 32.88 kg/m  Wt Readings from Last 3 Encounters:  03/20/19 181 lb (82.1 kg)  03/13/19 160 lb (72.6 kg)  01/22/19 176 lb (79.8 kg)    Health Maintenance Due  Topic Date Due  . Hepatitis C Screening  05/03/1951  . TETANUS/TDAP  09/23/1970  . MAMMOGRAM  09/22/2001  . COLONOSCOPY  09/22/2001  . DEXA SCAN  09/22/2016  . PNA vac Low Risk Adult (1 of 2 - PCV13) 09/22/2016    There are no preventive care reminders to display for this patient.   Lab Results  Component Value Date   TSH 0.815 07/03/2018   Lab Results  Component Value Date   WBC 9.9 08/31/2017   HGB 11.9 (L) 08/31/2017   HCT 37.5 08/31/2017   MCV 84.5 08/31/2017   PLT 268 08/31/2017   Lab Results  Component Value Date   NA 141 07/03/2018   K 5.1 07/03/2018   CO2 22 07/03/2018   GLUCOSE 127 (H) 07/03/2018   BUN 23 07/03/2018   CREATININE 1.12 (H) 07/03/2018   BILITOT <0.2 07/03/2018   ALKPHOS 204 (H) 07/03/2018   AST 58 (H) 07/03/2018   ALT 59 (H) 07/03/2018   PROT 6.9 07/03/2018   ALBUMIN 3.7 (L) 07/03/2018   CALCIUM 8.5 (L) 07/03/2018   ANIONGAP 8 08/31/2017   No results found for: CHOL No results found for: HDL No results found for: LDLCALC No results found for: TRIG No results found for: CHOLHDL No results found for: HGBA1C     Assessment & Plan:   Problem List Items Addressed This Visit    None    Visit Diagnoses    Need for influenza vaccination    -  Primary   Herpes zoster without complication       Relevant Medications   gabapentin (NEURONTIN) 300 MG capsule       Meds ordered this encounter  Medications  . gabapentin (NEURONTIN) 300 MG capsule    Sig: Take 1 capsule (300 mg total) by mouth  3 (three) times daily.    Dispense:  90 capsule     Refill:  3    Order Specific Question:   Supervising Provider    Answer:   Forrest Moron [id number]   PLAN  This continues to be a very intense, very large case of shingles. It has been around 2 months since onset, and while there is healing, it is apparent that these lesions are still very painful  Will start on gabapentin per protocol: 300mg  PO on day one, 300mg  PO bid on day two, 300mg  PO tid on day three. We will plan to call for follow up on Monday or Tuesday to monitor effect and discuss continued upward titration with max dose of 1800mg  PO daily in 3 separate doses.  She will present in the office in around 3 weeks for follow up  Patient encouraged to call clinic with any questions, comments, or concerns.    Maximiano Coss, NP

## 2019-03-20 NOTE — Patient Instructions (Signed)
° ° ° °  If you have lab work done today you will be contacted with your lab results within the next 2 weeks.  If you have not heard from us then please contact us. The fastest way to get your results is to register for My Chart. ° ° °IF you received an x-ray today, you will receive an invoice from [facility]. Please contact [facility] at [phone] with questions or concerns regarding your invoice.  ° °IF you received labwork today, you will receive an invoice from [facility]. Please contact [facility] at [phone] with questions or concerns regarding your invoice.  ° °Our billing staff will not be able to assist you with questions regarding bills from these companies. ° °You will be contacted with the lab results as soon as they are available. The fastest way to get your results is to activate your My Chart account. Instructions are located on the last page of this paperwork. If you have not heard from us regarding the results in 2 weeks, please contact this office. °  ° ° ° °

## 2019-03-25 ENCOUNTER — Other Ambulatory Visit: Payer: Self-pay | Admitting: Registered Nurse

## 2019-03-25 DIAGNOSIS — N644 Mastodynia: Secondary | ICD-10-CM

## 2019-04-06 ENCOUNTER — Other Ambulatory Visit: Payer: Self-pay | Admitting: Registered Nurse

## 2019-04-06 ENCOUNTER — Other Ambulatory Visit: Payer: Self-pay

## 2019-04-06 ENCOUNTER — Ambulatory Visit
Admission: RE | Admit: 2019-04-06 | Discharge: 2019-04-06 | Disposition: A | Discharge status: Home / Self Care | Payer: Medicare Other | Source: Ambulatory Visit | Attending: Registered Nurse | Admitting: Registered Nurse

## 2019-04-06 DIAGNOSIS — N644 Mastodynia: Secondary | ICD-10-CM | POA: Diagnosis not present

## 2019-04-10 ENCOUNTER — Telehealth (INDEPENDENT_AMBULATORY_CARE_PROVIDER_SITE_OTHER): Payer: Medicare Other | Admitting: Adult Health Nurse Practitioner

## 2019-04-10 ENCOUNTER — Other Ambulatory Visit: Payer: Self-pay

## 2019-04-10 ENCOUNTER — Encounter: Payer: Self-pay | Admitting: Adult Health Nurse Practitioner

## 2019-04-10 DIAGNOSIS — B029 Zoster without complications: Secondary | ICD-10-CM | POA: Diagnosis not present

## 2019-04-10 HISTORY — DX: Zoster without complications: B02.9

## 2019-04-10 NOTE — Progress Notes (Signed)
Telemedicine Encounter- SOAP NOTE Established Patient  This telephone encounter was conducted with the patient's (or proxy's) verbal consent via audio telecommunications: yes/no: Yes Patient was instructed to have this encounter in a suitably private space; and to only have persons present to whom they give permission to participate. In addition, patient identity was confirmed by use of name plus two identifiers (DOB and address).  I discussed the limitations, risks, security and privacy concerns of performing an evaluation and management service by telephone and the availability of in person appointments. I also discussed with the patient that there may be a patient responsible charge related to this service. The patient expressed understanding and agreed to proceed.  I spent a total of TIME; 0 MIN TO 60 MIN: 15 minutes talking with the patient or their proxy.  No chief complaint on file.   Subjective   Shelia Berg is a 68 y.o. established patient. Telephone visit today for continued pain with Shingles   HPI  Patient presents with continued pain from her Shingles.  She has not increased the dose of Gabapentin 300mg  tid and it has stopped working at that dose.  We discussed, through, the interpreter services, the need to increase neuropathic medications for maximum effectiveness.   Also, concerned about recent ultrasound and f/u recommended for axillary lymph node on the left.     Patient Active Problem List   Diagnosis Date Noted  . Shingles 04/10/2019  . Bladder cancer ([place]) 10/14/2018  . Acute pharyngitis 10/08/2018  . Ulcer of right lower extremity ([place]) 07/03/2018  . Cough 12/20/2016  . Acute bronchitis 12/20/2016  . Pleuritic chest pain 03/05/2016  . Pleurisy with effusion 03/05/2016  . Tobacco dependence in remission 03/05/2016  . Hyperkalemia 08/04/2015  . Hypokalemia 06/07/2014  . Osteoarthritis 10/03/2006  . FRACTURE, WRIST, LEFT 05/20/2001  . HX, PERSONAL, VENOUS  THROMBOSIS/EMBOLISM 05/15/1987  . TOTAL ABDOMINAL HYSTERECTOMY, HX OF 05/14/1986    Past Medical History:  Diagnosis Date  . Arthritis   . Bladder cancer ([place]) 6 years ago  . History of DVT (deep vein thrombosis)   . Infected traumatic leg ulcer, left, limited to breakdown of skin ([place])   . Shingles 04/10/2019    Current Outpatient Medications  Medication Sig Dispense Refill  . amoxicillin (AMOXIL) 500 MG capsule Take 1 capsule (500 mg total) by mouth 2 (two) times daily. 20 capsule 0  . aspirin EC 81 MG tablet Take 81 mg by mouth daily.    [name] BREO ELLIPTA 100-25 MCG/INH AEPB Inhale 1 application into the lungs daily.  5  . gabapentin (NEURONTIN) 300 MG capsule Take 1 capsule (300 mg total) by mouth 3 (three) times daily. 90 capsule 3  . ibuprofen (ADVIL,MOTRIN) 800 MG tablet Take 1 tablet (800 mg total) by mouth every 8 (eight) hours as needed. 30 tablet 2  . meloxicam (MOBIC) 7.5 MG tablet Take 1 tablet by mouth daily.    . mupirocin ointment (BACTROBAN) 2 % APPLY TO AFFECTED AREA TWICE A DAY    . valACYclovir (VALTREX) 1000 MG tablet Take 1 tablet (1,000 mg total) by mouth 3 (three) times daily. 21 tablet 0   No current facility-administered medications for this visit.     Allergies  Allergen Reactions  . Contrast Media [Iodinated Diagnostic Agents] Anaphylaxis    RESPIRATORY ARREST/Patient stopped breathing    Social History   Socioeconomic History  . Marital status: Married    Spouse name: Not on file  . Number of children:  Not on file  . Years of education: Not on file  . Highest education level: Not on file  Occupational History  . Not on file  Social Needs  . Financial resource strain: Not on file  . Food insecurity    Worry: Not on file    Inability: Not on file  . Transportation needs    Medical: Not on file    Non-medical: Not on file  Tobacco Use  . Smoking status: Former Smoker    Packs/day: 0.50    Years: 30.00    Pack years: 15.00    Types:  Cigarettes    Quit date: 06/05/2014    Years since quitting: 4.8  . Smokeless tobacco: Never Used  Substance and Sexual Activity  . Alcohol use: No    Alcohol/week: 0.0 standard drinks  . Drug use: No  . Sexual activity: Yes  Lifestyle  . Physical activity    Days per week: Not on file    Minutes per session: Not on file  . Stress: Not on file  Relationships  . Social Herbalist on phone: Not on file    Gets together: Not on file    Attends religious service: Not on file    Active member of club or organization: Not on file    Attends meetings of clubs or organizations: Not on file    Relationship status: Not on file  . Intimate partner violence    Fear of current or ex partner: Not on file    Emotionally abused: Not on file    Physically abused: Not on file    Forced sexual activity: Not on file  Other Topics Concern  . Not on file  Social History Narrative  . Not on file    Review of Systems  Constitutional: Positive for malaise/fatigue. Negative for chills and fever.  Eyes: Negative.   Respiratory: Negative for cough.   Cardiovascular: Negative for chest pain and leg swelling.  Skin: Positive for itching and rash.    Objective    'GEN: , NAD, Non-toxic, Alert & Oriented x 3  PSYCH: Normally interactive. Conversant. Not depressed or anxious appearing.  Calm demeanor.  Vitals as reported by the patient: There were no vitals filed for this visit.  Diagnoses and all orders for this visit:  Herpes zoster without complication   Recommended increasing the gabapentin and daily increments of 1 pill at each dosage time so that after 3 days she is on 2 pills 3 times a day.  In addition, I gave her the results of the ultrasound and it sounded like from the report they are less concerned about her breast and more concerned about the unilateral axillary lymph node that is enlarged.  She is going to follow-up with an ultrasound already scheduled for 3 months after  the initial date.  She is in line with the medication increase and will try.  If she continues to have serious pain will follow up with provider.  All questions were answered.  I discussed the assessment and treatment plan with the patient. The patient was provided an opportunity to ask questions and all were answered. The patient agreed with the plan and demonstrated an understanding of the instructions.   The patient was advised to call back or seek an in-person evaluation if the symptoms worsen or if the condition fails to improve as anticipated.  I provided 15 minutes of non-face-to-face time during this encounter.  Glyn Ade, NP  Primary Care at [facility]

## 2019-04-10 NOTE — Progress Notes (Signed)
Following up post mammogram, wants to talk about the finding. Was told that she will need a [place] due to not being sure if she is cured from having the shingle virus. Still taking the meds for shingles, does not seem to be helping with the pain. Will need [place] translator

## 2019-04-21 ENCOUNTER — Encounter: Payer: Self-pay | Admitting: Adult Health Nurse Practitioner

## 2019-04-21 ENCOUNTER — Other Ambulatory Visit: Payer: Self-pay

## 2019-04-21 ENCOUNTER — Telehealth (INDEPENDENT_AMBULATORY_CARE_PROVIDER_SITE_OTHER): Payer: Medicare Other | Admitting: Adult Health Nurse Practitioner

## 2019-04-21 VITALS — Wt 170.0 lb

## 2019-04-21 DIAGNOSIS — C679 Malignant neoplasm of bladder, unspecified: Secondary | ICD-10-CM

## 2019-04-21 DIAGNOSIS — B028 Zoster with other complications: Secondary | ICD-10-CM

## 2019-04-21 DIAGNOSIS — R918 Other nonspecific abnormal finding of lung field: Secondary | ICD-10-CM

## 2019-04-21 HISTORY — DX: Other nonspecific abnormal finding of lung field: R91.8

## 2019-04-22 ENCOUNTER — Telehealth: Payer: Self-pay | Admitting: General Practice

## 2019-04-22 NOTE — Telephone Encounter (Signed)
It  was seen yesterday provider had talked about 3 medicines one for pain and 2 others .. nothing called into pharmacy on file [facility] . [facility] let [facility] know what [facility] needs FR

## 2019-04-23 NOTE — Telephone Encounter (Addendum)
Pt states Shelia Berg was to call in a pain med for her, because she has shingles  Pt waiting for [facility] to [facility] [address]  Pt states she has had shingles 11 weeks and the pain is terrible.

## 2019-04-24 NOTE — Telephone Encounter (Signed)
Please Advise on this. Pk/CMA

## 2019-04-29 NOTE — Progress Notes (Signed)
.    04/29/2019  Shelia Berg 03/12/1951 [id number]    I connected with  Shelia Berg on 04/29/19 by a video enabled telemedicine application and verified that I am speaking with the correct person using two identifiers.   I discussed the limitations of evaluation and management by telemedicine. The patient expressed understanding and agreed to proceed.  I spent 21 minutes with the patient reviewing medical history and evaluating current medical problem. Translator services used.   Dermatitis: Patient complains of continued pain with shingles.  Reports the Gabapentin was working but stopped being effective.  Symptoms began 2 weeks ago of increased pain.  Denies fever, chills, or systemic symptoms.   In review, we also discussed her lung nodule f/u.  Pulmonology is following.   ROS  General ROS: positive for  - pain to affected area negative for - chills, fever or night sweats  Dermatological ROS: positive for pruritus, rash and skin lesion changes negative for hair changes and lumps   P.E. General appearance: oriented to person, place, and time. Mental Status: normal mood, behavior, speech, dress, motor activity, and thought processes.   Assessment   1. Herpes zoster with other complication   2. Lung nodule, multiple    Increased Gabapentin to 2 tid with stepwise increase over each 3 days.  2 tabs in am with 1 pill lunch, and dinner to increase dose q 3 days with addition of 1 pill.  After further discussion, patient verbalized understanding and will try.  Has an appointment with Pulmonology to discuss and screen for lung nodule.  She is inline with this plan.  Will call or return if Gabapentin reaches plateau.   Glyn Ade, NP

## 2019-06-08 DIAGNOSIS — R918 Other nonspecific abnormal finding of lung field: Secondary | ICD-10-CM | POA: Diagnosis not present

## 2019-06-08 DIAGNOSIS — J449 Chronic obstructive pulmonary disease, unspecified: Secondary | ICD-10-CM | POA: Diagnosis not present

## 2019-06-08 DIAGNOSIS — R071 Chest pain on breathing: Secondary | ICD-10-CM | POA: Diagnosis not present

## 2019-06-08 DIAGNOSIS — Z87891 Personal history of nicotine dependence: Secondary | ICD-10-CM | POA: Diagnosis not present

## 2019-06-08 DIAGNOSIS — B029 Zoster without complications: Secondary | ICD-10-CM | POA: Diagnosis not present

## 2019-06-08 DIAGNOSIS — Z791 Long term (current) use of non-steroidal anti-inflammatories (NSAID): Secondary | ICD-10-CM | POA: Diagnosis not present

## 2019-06-08 DIAGNOSIS — R0600 Dyspnea, unspecified: Secondary | ICD-10-CM | POA: Diagnosis not present

## 2019-06-08 DIAGNOSIS — Z7951 Long term (current) use of inhaled steroids: Secondary | ICD-10-CM | POA: Diagnosis not present

## 2019-07-07 ENCOUNTER — Other Ambulatory Visit: Payer: Self-pay

## 2019-07-07 ENCOUNTER — Ambulatory Visit (INDEPENDENT_AMBULATORY_CARE_PROVIDER_SITE_OTHER): Payer: Medicare Other | Admitting: Sports Medicine

## 2019-07-07 DIAGNOSIS — M25562 Pain in left knee: Secondary | ICD-10-CM | POA: Diagnosis not present

## 2019-07-07 MED ORDER — METHYLPREDNISOLONE ACETATE 40 MG/ML IJ SUSP
40.0000 mg | Freq: Once | INTRAMUSCULAR | Status: AC
  Administered 2019-07-07: 15:00:00 40 mg via INTRA_ARTICULAR

## 2019-07-07 NOTE — Patient Instructions (Signed)
  I think your knee is hurting because of some arthritis that I saw on a previous x-ray. This does not appear to be gout. I am going to give you a cortisone injection today and hopefully that will help.  If you are still having pain in a few days, call me back and I will get new x-rays of your knee.

## 2019-07-08 ENCOUNTER — Encounter: Payer: Self-pay | Admitting: Sports Medicine

## 2019-07-08 NOTE — Progress Notes (Signed)
   Subjective:    Patient ID: Shelia Berg, female    DOB: 1950/12/25, 69 y.o.   MRN: [id number]  HPI chief complaint: Left knee pain  Very pleasant 69 year old female comes in today complaining of 4 days of medial sided left knee pain.  She denies any trauma, but describes a rather sudden onset of pain that began without any inciting event.  Pain is achy in quality.  It is identical in nature to pain she has had in the past.  X-rays of the left knee in 2018 showed some mild degenerative changes.  She received 2 separate cortisone injections which resulted in resolution of her pain.  She has done well since then.  She denies any swelling.  No mechanical symptoms.  Past medical history reviewed Medications reviewed Allergies reviewed    Review of Systems As above    Objective:   Physical Exam  Well-developed, well-nourished.  No acute distress.  Awake alert and oriented x3.  Vital signs reviewed.  Left knee: Range of motion is 0 to about 100 degrees.  No obvious effusion.  1+ boggy synovitis.  Joint is not warm to touch.  No erythema.  She is tender to palpation along the medial joint line with pain but no popping with McMurray's.  Knee is grossly stable to ligamentous exam.  Neurovascularly intact distally.  X-rays of the left knee from 2018 are as above.  Please note that these are nonweightbearing films.      Assessment & Plan:   Left knee pain likely secondary to medial compartmental DJD  Patient's left knee is injected with cortisone today.  Anterior medial approach is utilized.  Patient tolerates this without difficulty.  If symptoms persist, then I will get updated imaging of the left knee including a weightbearing AP x-ray.  Patient also reports that she has had to have 2 separate cortisone injections in the past.  If today's injection does not provide her with good symptom relief, then I will attempt a second injection using an anterior lateral approach.  Follow-up for ongoing  or recalcitrant issues.  Consent obtained and verified. Time-out conducted. Noted no overlying erythema, induration, or other signs of local infection. Skin prepped in a sterile fashion. Topical analgesic spray: Ethyl chloride. Joint: left knee Needle: 25g 1.5 inch Completed without difficulty. Meds: 3cc 1% xylocaine, 1cc (40mg ) depomedrol  Advised to call if fevers/chills, erythema, induration, drainage, or persistent bleeding.

## 2019-07-09 ENCOUNTER — Other Ambulatory Visit: Payer: Medicare Other

## 2019-09-28 ENCOUNTER — Ambulatory Visit: Payer: Self-pay | Attending: Internal Medicine

## 2019-09-28 DIAGNOSIS — Z23 Encounter for immunization: Secondary | ICD-10-CM

## 2019-09-28 NOTE — Progress Notes (Signed)
   Covid-19 Vaccination Clinic  Name:  Shelia Berg    MRN: [id number] DOB: [birth year]  09/28/2019  Shelia Berg was observed post Covid-19 immunization for 15 minutes without incident. She was provided with Vaccine Information Sheet and instruction to access the V-Safe system.   Shelia Berg was instructed to call 911 with any severe reactions post vaccine: [name] Difficulty breathing  . Swelling of face and throat  . A fast heartbeat  . A bad rash all over body  . Dizziness and weakness   Immunizations Administered    Name Date Dose VIS Date Route   Pfizer COVID-19 Vaccine 09/28/2019 12:37 PM 0.3 mL 07/08/2018 Intramuscular   Manufacturer: [place]   Lot: [id number]   [place]: [id number]

## 2019-10-19 ENCOUNTER — Ambulatory Visit: Payer: Medicare Other | Attending: Internal Medicine

## 2019-10-19 DIAGNOSIS — Z23 Encounter for immunization: Secondary | ICD-10-CM

## 2019-10-19 NOTE — Progress Notes (Signed)
° °  Covid-19 Vaccination Clinic  Name:  Shelia Berg    MRN: [id number] DOB: 1950-07-02  10/19/2019  [name] was observed post Covid-19 immunization for 30 minutes based on pre-vaccination screening without incident. She was provided with Vaccine Information Sheet and instruction to access the V-Safe system.   [name] was instructed to call 911 with any severe reactions post vaccine:  Difficulty breathing   Swelling of face and throat   A fast heartbeat   A bad rash all over body   Dizziness and weakness   Immunizations Administered    Name Date Dose VIS Date Route   Pfizer COVID-19 Vaccine 10/19/2019 12:17 PM 0.3 mL 07/08/2018 Intramuscular   Manufacturer: [place]   Lot: [id number]   [place]: [id number]

## 2019-12-16 DIAGNOSIS — R0609 Other forms of dyspnea: Secondary | ICD-10-CM | POA: Diagnosis not present

## 2019-12-16 DIAGNOSIS — J449 Chronic obstructive pulmonary disease, unspecified: Secondary | ICD-10-CM | POA: Diagnosis not present

## 2019-12-16 DIAGNOSIS — J189 Pneumonia, unspecified organism: Secondary | ICD-10-CM | POA: Diagnosis not present

## 2019-12-16 DIAGNOSIS — R071 Chest pain on breathing: Secondary | ICD-10-CM | POA: Diagnosis not present

## 2019-12-16 DIAGNOSIS — R9389 Abnormal findings on diagnostic imaging of other specified body structures: Secondary | ICD-10-CM | POA: Diagnosis not present

## 2019-12-16 DIAGNOSIS — Z87891 Personal history of nicotine dependence: Secondary | ICD-10-CM | POA: Diagnosis not present

## 2019-12-16 DIAGNOSIS — Z79899 Other long term (current) drug therapy: Secondary | ICD-10-CM | POA: Diagnosis not present

## 2019-12-16 DIAGNOSIS — R918 Other nonspecific abnormal finding of lung field: Secondary | ICD-10-CM | POA: Diagnosis not present

## 2021-01-24 DIAGNOSIS — Z8551 Personal history of malignant neoplasm of bladder: Secondary | ICD-10-CM | POA: Diagnosis not present

## 2021-03-13 DIAGNOSIS — R918 Other nonspecific abnormal finding of lung field: Secondary | ICD-10-CM | POA: Diagnosis not present

## 2021-03-13 DIAGNOSIS — R071 Chest pain on breathing: Secondary | ICD-10-CM | POA: Diagnosis not present

## 2021-03-13 DIAGNOSIS — J449 Chronic obstructive pulmonary disease, unspecified: Secondary | ICD-10-CM | POA: Diagnosis not present

## 2021-03-13 DIAGNOSIS — R0609 Other forms of dyspnea: Secondary | ICD-10-CM | POA: Diagnosis not present

## 2021-03-13 DIAGNOSIS — J302 Other seasonal allergic rhinitis: Secondary | ICD-10-CM | POA: Diagnosis not present

## 2021-03-15 ENCOUNTER — Encounter: Payer: Self-pay | Admitting: Family Medicine

## 2021-03-15 ENCOUNTER — Ambulatory Visit: Payer: Self-pay

## 2021-03-15 ENCOUNTER — Ambulatory Visit (INDEPENDENT_AMBULATORY_CARE_PROVIDER_SITE_OTHER): Payer: Medicare Other | Admitting: Family Medicine

## 2021-03-15 VITALS — BP 126/64 | Ht 64.57 in | Wt 170.0 lb

## 2021-03-15 DIAGNOSIS — M25571 Pain in right ankle and joints of right foot: Secondary | ICD-10-CM

## 2021-03-15 MED ORDER — PREDNISONE 10 MG PO TABS: ORAL_TABLET | ORAL | 0 refills | Status: DC

## 2021-03-15 NOTE — Patient Instructions (Signed)
You flared the arthritis in your ankle. Take the prednisone dose pack as directed x 6 days. Stop the meloxicam for now but you can restart this the day after you finish the prednisone. Icing 15 minutes at a time 3-4 times a day as needed. Call me if you're not improving over the next week and we will see you back to reevaluate.

## 2021-03-15 NOTE — Progress Notes (Signed)
PCP: Patient, [name] (Inactive)  Subjective:   HPI: Patient is a 69 y.o. female here for right ankle/foot pain.  Patient denies acute injury or trauma.  She did do more walking/hiking than usual prior to current pain. Pain felt around entire ankle with some associated swelling and warmth. No bruising. No history of gout.  Past Medical History:  Diagnosis Date   Arthritis    Bladder cancer ([place]) 6 years ago   History of DVT (deep vein thrombosis)    Infected traumatic leg ulcer, left, limited to breakdown of skin ([place])    Lung nodule, multiple 04/21/2019   Shingles 04/10/2019    Current Outpatient Medications on File Prior to Visit  Medication Sig Dispense Refill   aspirin EC 81 MG tablet Take 81 mg by mouth daily.     BREO ELLIPTA 100-25 MCG/INH AEPB Inhale 1 application into the lungs daily.  5   gabapentin (NEURONTIN) 300 MG capsule Take 1 capsule (300 mg total) by mouth 3 (three) times daily. (Patient not taking: Reported on 04/21/2019) 90 capsule 3   ibuprofen (ADVIL,MOTRIN) 800 MG tablet Take 1 tablet (800 mg total) by mouth every 8 (eight) hours as needed. (Patient not taking: Reported on 04/21/2019) 30 tablet 2   meloxicam (MOBIC) 7.5 MG tablet Take 1 tablet by mouth daily.     mupirocin ointment (BACTROBAN) 2 % APPLY TO AFFECTED AREA TWICE A DAY     valACYclovir (VALTREX) 1000 MG tablet Take 1 tablet (1,000 mg total) by mouth 3 (three) times daily. (Patient not taking: Reported on 04/21/2019) 21 tablet 0   No current facility-administered medications on file prior to visit.    Past Surgical History:  Procedure Laterality Date   ABDOMINAL HYSTERECTOMY      Allergies  Allergen Reactions   Contrast Media [Iodinated Diagnostic Agents] Anaphylaxis    RESPIRATORY ARREST/Patient stopped breathing    BP 126/64   Ht 5' 4.57" (1.64 m)   Wt 170 lb (77.1 kg)   BMI 28.67 kg/m   No flowsheet data found.  No flowsheet data found.      Objective:  Physical  Exam:  Gen: NAD, comfortable in exam room  Right foot/ankle: Mild diffuse swelling about the ankle.  Mild warmth.  No other gross deformity, ecchymoses FROM TTP over anterior ankle joint, less posterior to medial and lateral malleoli.  No foot, other tenderness Negative ant drawer and negative talar tilt.   Negative syndesmotic compression. Thompsons test negative. NV intact distally.  Limited MSK u/s right ankle:  Peroneal, medial ankle, anterior ankle tendons all appear normal without tenosynovitis or tears.  No ankle effusion.  Mild spurring noted of tibiotalar joint.  Assessment & Plan:  1. Right ankle pain - consistent with flare of underlying arthritis.  No evidence gout.  Discussed options and she would like to take prednisone instead of a different nsaid than her typical meloxicam.  Icing, relative rest.  Call if not improving over next week.

## 2021-08-29 ENCOUNTER — Encounter: Payer: Self-pay | Admitting: Family Medicine

## 2021-08-29 ENCOUNTER — Ambulatory Visit (HOSPITAL_BASED_OUTPATIENT_CLINIC_OR_DEPARTMENT_OTHER)
Admission: RE | Admit: 2021-08-29 | Discharge: 2021-08-29 | Disposition: A | Discharge status: Home / Self Care | Payer: Medicare Other | Source: Ambulatory Visit | Attending: Family Medicine | Admitting: Family Medicine

## 2021-08-29 ENCOUNTER — Ambulatory Visit (INDEPENDENT_AMBULATORY_CARE_PROVIDER_SITE_OTHER): Payer: Medicare Other | Admitting: Family Medicine

## 2021-08-29 VITALS — BP 140/72 | Ht 64.0 in | Wt 175.0 lb

## 2021-08-29 DIAGNOSIS — M19072 Primary osteoarthritis, left ankle and foot: Secondary | ICD-10-CM | POA: Insufficient documentation

## 2021-08-29 MED ORDER — PREDNISONE 5 MG PO TABS: ORAL_TABLET | ORAL | 0 refills | Status: AC

## 2021-08-29 NOTE — Progress Notes (Signed)
?  Shelia Berg - 71 y.o. female MRN [id number]  Date of birth: 06/21/1950 ? ?SUBJECTIVE:  Including CC & ROS.  ?No chief complaint on file. ? ? ?Shelia Berg is a 71 y.o. female that is  presenting with acute left ankle pain. Has had similar pain a few months ago. No fall or injury. Having swelling over the lateral aspect. ? ? ?Review of Systems ?See HPI  ? ?HISTORY: Past Medical, Surgical, Social, and Family History Reviewed & Updated per EMR.   ?Pertinent Historical Findings include: ? ?Past Medical History:  ?Diagnosis Date  ? Arthritis   ? Bladder cancer ([place]) 6 years ago  ? History of DVT (deep vein thrombosis)   ? Infected traumatic leg ulcer, left, limited to breakdown of skin ([place])   ? Lung nodule, multiple 04/21/2019  ? Shingles 04/10/2019  ? ? ?Past Surgical History:  ?Procedure Laterality Date  ? ABDOMINAL HYSTERECTOMY    ? ? ? ?PHYSICAL EXAM:  ?VS: BP 140/72 (BP Location: Left Arm, Patient Position: Sitting)   Ht '5\' 4"'$  (1.626 m)   Wt 175 lb (79.4 kg)   BMI 30.04 kg/m?  ?Physical Exam ?Gen: NAD, alert, cooperative with exam, well-appearing ?MSK:  ?Neurovascularly intact   ? ? ? ? ?ASSESSMENT & PLAN:  ? ?Arthritis of ankle or foot, degenerative, left ?Acutely occurring.  Having swelling on the lateral aspect.  It is similar to her previous episodes. ?-Counseled on home exercise therapy and supportive care. ?-Prednisone. ?-X-ray. ?-Could consider injection.  ? ? ? ? ?

## 2021-08-29 NOTE — Patient Instructions (Signed)
Nice to meet you ?Please use ice as needed ?I will call with the xray results.  ?Please try the medicine   ?Please send me a message in MyChart with any questions or updates.  ?Please see me back in 4 weeks.  ? ?--Dr. Raeford Razor ? ?

## 2021-08-29 NOTE — Assessment & Plan Note (Signed)
Acutely occurring.  Having swelling on the lateral aspect.  It is similar to her previous episodes. ?-Counseled on home exercise therapy and supportive care. ?-Prednisone. ?-X-ray. ?-Could consider injection.  ?

## 2021-09-04 ENCOUNTER — Telehealth: Payer: Self-pay | Admitting: Family Medicine

## 2021-09-04 NOTE — Telephone Encounter (Signed)
Informed of results.  ? ?Rosemarie Ax, MD ?[facility] ?09/04/2021, 10:06 AM ? ?

## 2021-09-25 ENCOUNTER — Ambulatory Visit: Payer: Medicare Other | Admitting: Family Medicine

## 2021-11-03 DIAGNOSIS — H9012 Conductive hearing loss, unilateral, left ear, with unrestricted hearing on the contralateral side: Secondary | ICD-10-CM | POA: Diagnosis not present

## 2021-11-03 DIAGNOSIS — H60331 Swimmer's ear, right ear: Secondary | ICD-10-CM | POA: Diagnosis not present

## 2021-11-13 ENCOUNTER — Other Ambulatory Visit: Payer: Self-pay | Admitting: Otolaryngology

## 2021-11-13 DIAGNOSIS — H6690 Otitis media, unspecified, unspecified ear: Secondary | ICD-10-CM

## 2021-11-21 ENCOUNTER — Ambulatory Visit
Admission: RE | Admit: 2021-11-21 | Discharge: 2021-11-21 | Disposition: A | Discharge status: Home / Self Care | Payer: Medicare Other | Source: Ambulatory Visit | Attending: Otolaryngology | Admitting: Otolaryngology

## 2021-11-21 DIAGNOSIS — H6690 Otitis media, unspecified, unspecified ear: Secondary | ICD-10-CM

## 2021-11-21 DIAGNOSIS — H748X2 Other specified disorders of left middle ear and mastoid: Secondary | ICD-10-CM | POA: Diagnosis not present

## 2021-11-23 ENCOUNTER — Other Ambulatory Visit: Payer: Self-pay | Admitting: Otolaryngology

## 2021-11-23 DIAGNOSIS — H9212 Otorrhea, left ear: Secondary | ICD-10-CM

## 2021-12-07 ENCOUNTER — Ambulatory Visit
Admission: RE | Admit: 2021-12-07 | Discharge: 2021-12-07 | Disposition: A | Discharge status: Home / Self Care | Payer: Medicare Other | Source: Ambulatory Visit | Attending: Otolaryngology | Admitting: Otolaryngology

## 2021-12-07 DIAGNOSIS — J320 Chronic maxillary sinusitis: Secondary | ICD-10-CM | POA: Diagnosis not present

## 2021-12-07 DIAGNOSIS — H9212 Otorrhea, left ear: Secondary | ICD-10-CM

## 2021-12-14 ENCOUNTER — Other Ambulatory Visit: Payer: Self-pay | Admitting: Otolaryngology

## 2021-12-14 DIAGNOSIS — H6692 Otitis media, unspecified, left ear: Secondary | ICD-10-CM

## 2022-01-03 DIAGNOSIS — H25812 Combined forms of age-related cataract, left eye: Secondary | ICD-10-CM | POA: Diagnosis not present

## 2022-01-08 DIAGNOSIS — H25812 Combined forms of age-related cataract, left eye: Secondary | ICD-10-CM | POA: Diagnosis not present

## 2022-01-08 DIAGNOSIS — H269 Unspecified cataract: Secondary | ICD-10-CM | POA: Diagnosis not present

## 2022-01-16 ENCOUNTER — Inpatient Hospital Stay: Admission: RE | Admit: 2022-01-16 | Payer: Medicare Other | Source: Ambulatory Visit

## 2022-01-16 ENCOUNTER — Other Ambulatory Visit: Payer: Self-pay | Admitting: Otolaryngology

## 2022-01-16 DIAGNOSIS — H9212 Otorrhea, left ear: Secondary | ICD-10-CM

## 2022-01-16 DIAGNOSIS — H6690 Otitis media, unspecified, unspecified ear: Secondary | ICD-10-CM

## 2022-01-23 ENCOUNTER — Emergency Department (HOSPITAL_COMMUNITY): Payer: Medicare Other

## 2022-01-23 ENCOUNTER — Encounter (HOSPITAL_COMMUNITY): Payer: Self-pay

## 2022-01-23 ENCOUNTER — Emergency Department (HOSPITAL_COMMUNITY)
Admission: EM | Admit: 2022-01-23 | Discharge: 2022-01-23 | Disposition: A | Discharge status: Other Acute Inpatient Hospital | Payer: Medicare Other | Attending: Emergency Medicine | Admitting: Emergency Medicine

## 2022-01-23 DIAGNOSIS — B954 Other streptococcus as the cause of diseases classified elsewhere: Secondary | ICD-10-CM | POA: Diagnosis not present

## 2022-01-23 DIAGNOSIS — N179 Acute kidney failure, unspecified: Secondary | ICD-10-CM | POA: Diagnosis not present

## 2022-01-23 DIAGNOSIS — Z79899 Other long term (current) drug therapy: Secondary | ICD-10-CM | POA: Diagnosis not present

## 2022-01-23 DIAGNOSIS — H7092 Unspecified mastoiditis, left ear: Secondary | ICD-10-CM | POA: Insufficient documentation

## 2022-01-23 DIAGNOSIS — T8132XA Disruption of internal operation (surgical) wound, not elsewhere classified, initial encounter: Secondary | ICD-10-CM | POA: Diagnosis not present

## 2022-01-23 DIAGNOSIS — H70892 Other mastoiditis and related conditions, left ear: Secondary | ICD-10-CM | POA: Diagnosis not present

## 2022-01-23 DIAGNOSIS — H4710 Unspecified papilledema: Secondary | ICD-10-CM | POA: Diagnosis not present

## 2022-01-23 DIAGNOSIS — Z91041 Radiographic dye allergy status: Secondary | ICD-10-CM | POA: Diagnosis not present

## 2022-01-23 DIAGNOSIS — Z7982 Long term (current) use of aspirin: Secondary | ICD-10-CM | POA: Diagnosis not present

## 2022-01-23 DIAGNOSIS — J3489 Other specified disorders of nose and nasal sinuses: Secondary | ICD-10-CM | POA: Diagnosis not present

## 2022-01-23 DIAGNOSIS — R519 Headache, unspecified: Secondary | ICD-10-CM | POA: Diagnosis not present

## 2022-01-23 DIAGNOSIS — I636 Cerebral infarction due to cerebral venous thrombosis, nonpyogenic: Secondary | ICD-10-CM | POA: Insufficient documentation

## 2022-01-23 DIAGNOSIS — Z9889 Other specified postprocedural states: Secondary | ICD-10-CM | POA: Diagnosis not present

## 2022-01-23 DIAGNOSIS — B957 Other staphylococcus as the cause of diseases classified elsewhere: Secondary | ICD-10-CM | POA: Diagnosis not present

## 2022-01-23 DIAGNOSIS — H7122 Cholesteatoma of mastoid, left ear: Secondary | ICD-10-CM | POA: Diagnosis not present

## 2022-01-23 DIAGNOSIS — H70002 Acute mastoiditis without complications, left ear: Secondary | ICD-10-CM | POA: Diagnosis not present

## 2022-01-23 DIAGNOSIS — Z7951 Long term (current) use of inhaled steroids: Secondary | ICD-10-CM | POA: Diagnosis not present

## 2022-01-23 DIAGNOSIS — H9202 Otalgia, left ear: Secondary | ICD-10-CM | POA: Diagnosis not present

## 2022-01-23 DIAGNOSIS — E872 Acidosis, unspecified: Secondary | ICD-10-CM | POA: Diagnosis not present

## 2022-01-23 DIAGNOSIS — G08 Intracranial and intraspinal phlebitis and thrombophlebitis: Secondary | ICD-10-CM | POA: Diagnosis not present

## 2022-01-23 DIAGNOSIS — Z87891 Personal history of nicotine dependence: Secondary | ICD-10-CM | POA: Diagnosis not present

## 2022-01-23 DIAGNOSIS — H7192 Unspecified cholesteatoma, left ear: Secondary | ICD-10-CM | POA: Diagnosis not present

## 2022-01-23 DIAGNOSIS — R42 Dizziness and giddiness: Secondary | ICD-10-CM | POA: Diagnosis not present

## 2022-01-23 DIAGNOSIS — Z8249 Family history of ischemic heart disease and other diseases of the circulatory system: Secondary | ICD-10-CM | POA: Diagnosis not present

## 2022-01-23 DIAGNOSIS — Z8551 Personal history of malignant neoplasm of bladder: Secondary | ICD-10-CM | POA: Diagnosis not present

## 2022-01-23 DIAGNOSIS — Z87892 Personal history of anaphylaxis: Secondary | ICD-10-CM | POA: Diagnosis not present

## 2022-01-23 DIAGNOSIS — B9689 Other specified bacterial agents as the cause of diseases classified elsewhere: Secondary | ICD-10-CM | POA: Diagnosis not present

## 2022-01-23 LAB — URINALYSIS, ROUTINE W REFLEX MICROSCOPIC
Bilirubin Urine: NEGATIVE
Glucose, UA: NEGATIVE mg/dL
Hgb urine dipstick: NEGATIVE
Ketones, ur: NEGATIVE mg/dL
Leukocytes,Ua: NEGATIVE
Nitrite: NEGATIVE
Protein, ur: NEGATIVE mg/dL
Specific Gravity, Urine: 1.013 (ref 1.005–1.030)
pH: 7 (ref 5.0–8.0)

## 2022-01-23 LAB — CBC
HCT: 40.6 % (ref 36.0–46.0)
Hemoglobin: 12.4 g/dL (ref 12.0–15.0)
MCH: 26.6 pg (ref 26.0–34.0)
MCHC: 30.5 g/dL (ref 30.0–36.0)
MCV: 86.9 fL (ref 80.0–100.0)
Platelets: 359 10*3/uL (ref 150–400)
RBC: 4.67 MIL/uL (ref 3.87–5.11)
RDW: 14.3 % (ref 11.5–15.5)
WBC: 9.1 10*3/uL (ref 4.0–10.5)
nRBC: 0 % (ref 0.0–0.2)

## 2022-01-23 LAB — BASIC METABOLIC PANEL
Anion gap: 5 (ref 5–15)
BUN: 17 mg/dL (ref 8–23)
CO2: 26 mmol/L (ref 22–32)
Calcium: 9.2 mg/dL (ref 8.9–10.3)
Chloride: 110 mmol/L (ref 98–111)
Creatinine, Ser: 0.96 mg/dL (ref 0.44–1.00)
GFR, Estimated: >60 mL/min (ref >60)
Glucose, Bld: 129 mg/dL — ABNORMAL HIGH (ref 70–99)
Potassium: 5.2 mmol/L — ABNORMAL HIGH (ref 3.5–5.1)
Sodium: 141 mmol/L (ref 135–145)

## 2022-01-23 LAB — CBG MONITORING, ED: Glucose-Capillary: 109 mg/dL — ABNORMAL HIGH (ref 70–99)

## 2022-01-23 LAB — POTASSIUM: Potassium: 4.8 mmol/L (ref 3.5–5.1)

## 2022-01-23 MED ORDER — LORAZEPAM 2 MG/ML IJ SOLN
0.5000 mg | Freq: Once | INTRAMUSCULAR | Status: AC
  Administered 2022-01-23: 0.5 mg via INTRAVENOUS
  Filled 2022-01-23: qty 1

## 2022-01-23 MED ORDER — MECLIZINE HCL 25 MG PO TABS
12.5000 mg | ORAL_TABLET | Freq: Once | ORAL | Status: AC
  Administered 2022-01-23: 12.5 mg via ORAL
  Filled 2022-01-23: qty 1

## 2022-01-23 MED ORDER — PIPERACILLIN-TAZOBACTAM 3.375 G IVPB 30 MIN
3.3750 g | INTRAVENOUS | Status: AC
  Administered 2022-01-23: 3.375 g via INTRAVENOUS
  Filled 2022-01-23: qty 50

## 2022-01-23 NOTE — ED Triage Notes (Signed)
Pt states that she has been having L ear pain ongoing since June. Pt states that today when she woke up she began having dizziness. Denies fall/injury.

## 2022-01-23 NOTE — ED Provider Notes (Signed)
[place] DEPT Provider Note   CSN: [id number] Arrival date & time: 01/23/22  0800     History {Add pertinent medical, surgical, social history, OB history to HPI:1} Chief Complaint  Patient presents with  . Dizziness  . Otalgia    Shelia Berg is a 71 y.o. female.  HPI Level 5 caveat secondary to language barrier, the patient speaks [place] Daughter at bedside translating 71 year old female presents today complaining of left ear pain, drainage, and dizziness.  Patient has had a known mastoiditis.  She has been on multiple antibiotics most recently on Augmentin.  She is continued to have drainage from her ear.  Today she became weaker and vertiginous.  She has been unable to walk due to severe nausea and vomiting with this.  She has been seen by Dr. Benjamine Mola  She has been referred to ENT at [facility]. +ear pain, drainage, nausea and vomiting -fever, chills,neck pain     Home Medications Prior to Admission medications   Medication Sig Start Date End Date Taking? Authorizing Provider  aspirin EC 81 MG tablet Take 81 mg by mouth daily.    [provider]  BREO ELLIPTA 100-25 MCG/INH AEPB Inhale 1 application into the lungs daily. 10/27/17   [provider]  gabapentin (NEURONTIN) 300 MG capsule Take 1 capsule (300 mg total) by mouth 3 (three) times daily. Patient not taking: Reported on 04/21/2019 03/20/19   Maximiano Coss, NP  ibuprofen (ADVIL,MOTRIN) 800 MG tablet Take 1 tablet (800 mg total) by mouth every 8 (eight) hours as needed. Patient not taking: Reported on 04/21/2019 07/16/18   Azzie Glatter, FNP  meloxicam (MOBIC) 7.5 MG tablet Take 1 tablet by mouth daily. 11/04/17   [provider]  mupirocin ointment (BACTROBAN) 2 % APPLY TO AFFECTED AREA TWICE A DAY 01/13/19   [provider]  predniSONE (DELTASONE) 5 MG tablet Take 6 pills for first day, 5 pills second day, 4 pills third day, 3 pills fourth day, 2  pills the fifth day, and 1 pill sixth day. 08/29/21   Rosemarie Ax, MD  valACYclovir (VALTREX) 1000 MG tablet Take 1 tablet (1,000 mg total) by mouth 3 (three) times daily. Patient not taking: Reported on 04/21/2019 01/22/19   Posey Boyer, MD      Allergies    Contrast media [iodinated contrast media]    Review of Systems   Review of Systems  Physical Exam Updated Vital Signs BP (!) 180/93   Pulse 83   Temp 98.8 F (37.1 C) (Oral)   Resp 20   Ht 1.575 m ('5\' 2"'$ )   Wt 81.6 kg   SpO2 98%   BMI 32.92 kg/m  Physical Exam Vitals and nursing note reviewed.  Constitutional:      General: She is not in acute distress.    Appearance: Normal appearance. She is ill-appearing.  HENT:     Head: Normocephalic.     Right Ear: Tympanic membrane, ear canal and external ear normal.     Nose: Nose normal.     Comments: Left tm with some blood inferior aspect, om d/c noted No mastoid redness, ttp    Mouth/Throat:     Pharynx: Oropharynx is clear.  Eyes:     Pupils: Pupils are equal, round, and reactive to light.  Cardiovascular:     Rate and Rhythm: Normal rate and regular rhythm.     Pulses: Normal pulses.     Heart sounds: Normal heart sounds.  Pulmonary:     Effort: Pulmonary effort is normal.  Abdominal:     General: Abdomen is flat. Bowel sounds are normal.     Palpations: Abdomen is soft.  Musculoskeletal:        General: Normal range of motion.     Cervical back: Normal range of motion.  Skin:    General: Skin is warm and dry.     Capillary Refill: Capillary refill takes less than 2 seconds.  Neurological:     General: No focal deficit present.     Mental Status: She is alert.     Cranial Nerves: No cranial nerve deficit.     Sensory: No sensory deficit.     Motor: No weakness.     Coordination: Coordination normal.     Deep Tendon Reflexes: Reflexes normal.  Psychiatric:        Mood and Affect: Mood normal.    ED Results / Procedures / Treatments    Labs (all labs ordered are listed, but only abnormal results are displayed) Labs Reviewed  BASIC METABOLIC PANEL - Abnormal; Notable for the following components:      Result Value   Potassium 5.2 (*)    Glucose, Bld 129 (*)    All other components within normal limits  CBG MONITORING, ED - Abnormal; Notable for the following components:   Glucose-Capillary 109 (*)    All other components within normal limits  CBC  URINALYSIS, ROUTINE W REFLEX MICROSCOPIC  POTASSIUM    EKG None  Radiology CT Head Wo Contrast  Result Date: 01/23/2022 CLINICAL DATA:  Headache, new or worsening (Age >= 50y) Patient with known mastoditis and worsening symptoms EXAM: CT HEAD WITHOUT CONTRAST TECHNIQUE: Contiguous axial images were obtained from the base of the skull through the vertex without intravenous contrast. RADIATION DOSE REDUCTION: This exam was performed according to the departmental dose-optimization program which includes automated exposure control, adjustment of the mA and/or kV according to patient size and/or use of iterative reconstruction technique. COMPARISON:  MRI head 12/07/2021.  CT temporal bone 11/21/2021. FINDINGS: Brain: Limited assessment by noncontrast CT head, but overall similar appearance of opacified left middle ear and mastoid air cells with inferior mastoid erosion and fluid better characterized on prior dedicated temporal bone CT and MRI. Similar abnormal appearance of the adjacent dural venous sinuses, comparable with known dural venous sinus thrombosis. No evidence of new/acute large vascular territory infarct, acute hemorrhage, brain mass or midline shift. Vascular: No hyperdense vessel identified. Skull: No acute fracture. Sinuses/Orbits: No acute findings. IMPRESSION: 1. Limited assessment by noncontrast CT head, but overall similar appearance of opacified left middle ear and mastoid air cells with inferior mastoid erosion and fluid better characterized on prior dedicated  temporal bone CT and MRI. Adjacent probable dural venous sinus thrombosis also better characterized on prior MRI. 2. No evidence of new acute/interval intracranial abnormality; however, MRI with contrast could provide more sensitive evaluation if clinically warranted. Electronically Signed   By: Margaretha Sheffield M.D.   On: 01/23/2022 12:44    Procedures Procedures  {Document cardiac monitor, telemetry assessment procedure when appropriate:1}  Medications Ordered in ED Medications  meclizine (ANTIVERT) tablet 12.5 mg (12.5 mg Oral Given 01/23/22 1256)  LORazepam (ATIVAN) injection 0.5 mg (0.5 mg Intravenous Given 01/23/22 1340)    ED Course/ Medical Decision Making/ A&P Clinical Course as of 01/23/22 1358  Tue Jan 23, 2022  1001 CBG monitoring, ED(!) [MA]    Clinical Course User Index [MA] Huston Foley, [place]  A, Student-PA                           Medical Decision Making 71 year old female with mastoiditis with associated bony erosion and dural vein thrombosis.  Patient presents today with vertigo and nausea and vomiting. Patient is hemodynamically stable Care discussed with Dr. Leonarda Salon Plan transfer to [facility] Discussed care with Dr. Sharee Pimple- plan zosyn and transfer to bed at [place] and/or Complexity of Data Reviewed Labs: ordered. Decision-making details documented in ED Course. Radiology: ordered and independent interpretation performed. Decision-making details documented in ED Course. Discussion of management or test interpretation with external provider(s): Care discussed with Dr. Benjamine Mola   Risk Prescription drug management.   ***  {Document critical care time when appropriate:1} {Document review of labs and clinical decision tools ie heart score, Chads2Vasc2 etc:1}  {Document your independent review of radiology images, and any outside records:1} {Document your discussion with family members, caretakers, and with consultants:1} {Document social  determinants of health affecting pt's care:1} {Document your decision making why or why not admission, treatments were needed:1} Final Clinical Impression(s) / ED Diagnoses Final diagnoses:  None    Rx / DC Orders ED Discharge Orders     None

## 2022-01-23 NOTE — ED Notes (Signed)
Pt transported to [facility] via [place] transport. Pt vitally stable and in NAD upon dc.  All belongings given to pt's daughter.  No belongings left in room upon transfer.

## 2022-01-24 DIAGNOSIS — H7092 Unspecified mastoiditis, left ear: Secondary | ICD-10-CM | POA: Diagnosis not present

## 2022-01-24 DIAGNOSIS — B954 Other streptococcus as the cause of diseases classified elsewhere: Secondary | ICD-10-CM | POA: Diagnosis not present

## 2022-01-24 DIAGNOSIS — B957 Other staphylococcus as the cause of diseases classified elsewhere: Secondary | ICD-10-CM | POA: Diagnosis not present

## 2022-01-24 DIAGNOSIS — H70892 Other mastoiditis and related conditions, left ear: Secondary | ICD-10-CM | POA: Diagnosis not present

## 2022-01-24 DIAGNOSIS — B9689 Other specified bacterial agents as the cause of diseases classified elsewhere: Secondary | ICD-10-CM | POA: Diagnosis not present

## 2022-01-24 DIAGNOSIS — G08 Intracranial and intraspinal phlebitis and thrombophlebitis: Secondary | ICD-10-CM | POA: Diagnosis not present

## 2022-01-25 DIAGNOSIS — H7092 Unspecified mastoiditis, left ear: Secondary | ICD-10-CM | POA: Diagnosis not present

## 2022-01-25 DIAGNOSIS — G08 Intracranial and intraspinal phlebitis and thrombophlebitis: Secondary | ICD-10-CM | POA: Diagnosis not present

## 2022-01-25 DIAGNOSIS — R519 Headache, unspecified: Secondary | ICD-10-CM | POA: Diagnosis not present

## 2022-01-25 DIAGNOSIS — Z9889 Other specified postprocedural states: Secondary | ICD-10-CM | POA: Diagnosis not present

## 2022-01-25 DIAGNOSIS — N179 Acute kidney failure, unspecified: Secondary | ICD-10-CM | POA: Diagnosis not present

## 2022-01-25 DIAGNOSIS — B954 Other streptococcus as the cause of diseases classified elsewhere: Secondary | ICD-10-CM | POA: Diagnosis not present

## 2022-01-26 DIAGNOSIS — B957 Other staphylococcus as the cause of diseases classified elsewhere: Secondary | ICD-10-CM | POA: Diagnosis not present

## 2022-01-26 DIAGNOSIS — Z9889 Other specified postprocedural states: Secondary | ICD-10-CM | POA: Diagnosis not present

## 2022-01-26 DIAGNOSIS — H7092 Unspecified mastoiditis, left ear: Secondary | ICD-10-CM | POA: Diagnosis not present

## 2022-01-26 DIAGNOSIS — N179 Acute kidney failure, unspecified: Secondary | ICD-10-CM | POA: Diagnosis not present

## 2022-01-26 DIAGNOSIS — R519 Headache, unspecified: Secondary | ICD-10-CM | POA: Diagnosis not present

## 2022-01-26 DIAGNOSIS — G08 Intracranial and intraspinal phlebitis and thrombophlebitis: Secondary | ICD-10-CM | POA: Diagnosis not present

## 2022-01-26 DIAGNOSIS — B954 Other streptococcus as the cause of diseases classified elsewhere: Secondary | ICD-10-CM | POA: Diagnosis not present

## 2022-01-26 DIAGNOSIS — B9689 Other specified bacterial agents as the cause of diseases classified elsewhere: Secondary | ICD-10-CM | POA: Diagnosis not present

## 2022-01-27 DIAGNOSIS — N179 Acute kidney failure, unspecified: Secondary | ICD-10-CM | POA: Diagnosis not present

## 2022-01-27 DIAGNOSIS — R519 Headache, unspecified: Secondary | ICD-10-CM | POA: Diagnosis not present

## 2022-01-27 DIAGNOSIS — Z9889 Other specified postprocedural states: Secondary | ICD-10-CM | POA: Diagnosis not present

## 2022-01-27 DIAGNOSIS — G08 Intracranial and intraspinal phlebitis and thrombophlebitis: Secondary | ICD-10-CM | POA: Diagnosis not present

## 2022-01-27 DIAGNOSIS — H7092 Unspecified mastoiditis, left ear: Secondary | ICD-10-CM | POA: Diagnosis not present

## 2022-01-28 DIAGNOSIS — H7092 Unspecified mastoiditis, left ear: Secondary | ICD-10-CM | POA: Diagnosis not present

## 2022-01-28 DIAGNOSIS — E872 Acidosis, unspecified: Secondary | ICD-10-CM | POA: Diagnosis not present

## 2022-01-28 DIAGNOSIS — G08 Intracranial and intraspinal phlebitis and thrombophlebitis: Secondary | ICD-10-CM | POA: Diagnosis not present

## 2022-01-28 DIAGNOSIS — N179 Acute kidney failure, unspecified: Secondary | ICD-10-CM | POA: Diagnosis not present

## 2022-01-28 DIAGNOSIS — Z9889 Other specified postprocedural states: Secondary | ICD-10-CM | POA: Diagnosis not present

## 2022-01-29 DIAGNOSIS — H7092 Unspecified mastoiditis, left ear: Secondary | ICD-10-CM | POA: Diagnosis not present

## 2022-01-29 DIAGNOSIS — T8132XA Disruption of internal operation (surgical) wound, not elsewhere classified, initial encounter: Secondary | ICD-10-CM | POA: Diagnosis not present

## 2022-01-29 DIAGNOSIS — G08 Intracranial and intraspinal phlebitis and thrombophlebitis: Secondary | ICD-10-CM | POA: Diagnosis not present

## 2022-01-29 DIAGNOSIS — H7192 Unspecified cholesteatoma, left ear: Secondary | ICD-10-CM | POA: Diagnosis not present

## 2022-02-02 DIAGNOSIS — H7092 Unspecified mastoiditis, left ear: Secondary | ICD-10-CM | POA: Diagnosis not present

## 2022-02-02 DIAGNOSIS — G08 Intracranial and intraspinal phlebitis and thrombophlebitis: Secondary | ICD-10-CM | POA: Diagnosis not present

## 2022-02-06 DIAGNOSIS — G08 Intracranial and intraspinal phlebitis and thrombophlebitis: Secondary | ICD-10-CM | POA: Diagnosis not present

## 2022-02-06 DIAGNOSIS — H7092 Unspecified mastoiditis, left ear: Secondary | ICD-10-CM | POA: Diagnosis not present

## 2022-02-10 DIAGNOSIS — H7092 Unspecified mastoiditis, left ear: Secondary | ICD-10-CM | POA: Diagnosis not present

## 2022-02-10 DIAGNOSIS — G08 Intracranial and intraspinal phlebitis and thrombophlebitis: Secondary | ICD-10-CM | POA: Diagnosis not present

## 2022-02-11 DIAGNOSIS — G08 Intracranial and intraspinal phlebitis and thrombophlebitis: Secondary | ICD-10-CM | POA: Diagnosis not present

## 2022-02-11 DIAGNOSIS — H7092 Unspecified mastoiditis, left ear: Secondary | ICD-10-CM | POA: Diagnosis not present

## 2022-02-13 DIAGNOSIS — Z09 Encounter for follow-up examination after completed treatment for conditions other than malignant neoplasm: Secondary | ICD-10-CM | POA: Diagnosis not present

## 2022-02-13 DIAGNOSIS — I2510 Atherosclerotic heart disease of native coronary artery without angina pectoris: Secondary | ICD-10-CM | POA: Diagnosis not present

## 2022-02-13 DIAGNOSIS — G039 Meningitis, unspecified: Secondary | ICD-10-CM | POA: Diagnosis not present

## 2022-02-13 DIAGNOSIS — H7192 Unspecified cholesteatoma, left ear: Secondary | ICD-10-CM | POA: Diagnosis not present

## 2022-02-13 DIAGNOSIS — A499 Bacterial infection, unspecified: Secondary | ICD-10-CM | POA: Diagnosis not present

## 2022-02-13 DIAGNOSIS — G08 Intracranial and intraspinal phlebitis and thrombophlebitis: Secondary | ICD-10-CM | POA: Diagnosis not present

## 2022-02-13 DIAGNOSIS — Z792 Long term (current) use of antibiotics: Secondary | ICD-10-CM | POA: Diagnosis not present

## 2022-02-13 DIAGNOSIS — H7092 Unspecified mastoiditis, left ear: Secondary | ICD-10-CM | POA: Diagnosis not present

## 2022-02-13 DIAGNOSIS — Z139 Encounter for screening, unspecified: Secondary | ICD-10-CM | POA: Diagnosis not present

## 2022-02-13 DIAGNOSIS — R7989 Other specified abnormal findings of blood chemistry: Secondary | ICD-10-CM | POA: Diagnosis not present

## 2022-02-13 DIAGNOSIS — I10 Essential (primary) hypertension: Secondary | ICD-10-CM | POA: Diagnosis not present

## 2022-02-15 DIAGNOSIS — Z7901 Long term (current) use of anticoagulants: Secondary | ICD-10-CM | POA: Diagnosis not present

## 2022-02-15 DIAGNOSIS — Z09 Encounter for follow-up examination after completed treatment for conditions other than malignant neoplasm: Secondary | ICD-10-CM | POA: Diagnosis not present

## 2022-02-15 DIAGNOSIS — A499 Bacterial infection, unspecified: Secondary | ICD-10-CM | POA: Diagnosis not present

## 2022-02-15 DIAGNOSIS — Z792 Long term (current) use of antibiotics: Secondary | ICD-10-CM | POA: Diagnosis not present

## 2022-02-15 DIAGNOSIS — H7092 Unspecified mastoiditis, left ear: Secondary | ICD-10-CM | POA: Diagnosis not present

## 2022-02-15 DIAGNOSIS — G08 Intracranial and intraspinal phlebitis and thrombophlebitis: Secondary | ICD-10-CM | POA: Diagnosis not present

## 2022-02-17 DIAGNOSIS — H7092 Unspecified mastoiditis, left ear: Secondary | ICD-10-CM | POA: Diagnosis not present

## 2022-02-17 DIAGNOSIS — G08 Intracranial and intraspinal phlebitis and thrombophlebitis: Secondary | ICD-10-CM | POA: Diagnosis not present

## 2022-02-19 DIAGNOSIS — G08 Intracranial and intraspinal phlebitis and thrombophlebitis: Secondary | ICD-10-CM | POA: Diagnosis not present

## 2022-02-19 DIAGNOSIS — H7092 Unspecified mastoiditis, left ear: Secondary | ICD-10-CM | POA: Diagnosis not present

## 2022-02-20 DIAGNOSIS — H7092 Unspecified mastoiditis, left ear: Secondary | ICD-10-CM | POA: Diagnosis not present

## 2022-02-20 DIAGNOSIS — L304 Erythema intertrigo: Secondary | ICD-10-CM | POA: Diagnosis not present

## 2022-02-20 DIAGNOSIS — G08 Intracranial and intraspinal phlebitis and thrombophlebitis: Secondary | ICD-10-CM | POA: Diagnosis not present

## 2022-02-22 DIAGNOSIS — G08 Intracranial and intraspinal phlebitis and thrombophlebitis: Secondary | ICD-10-CM | POA: Diagnosis not present

## 2022-02-22 DIAGNOSIS — H7092 Unspecified mastoiditis, left ear: Secondary | ICD-10-CM | POA: Diagnosis not present

## 2022-02-27 DIAGNOSIS — G08 Intracranial and intraspinal phlebitis and thrombophlebitis: Secondary | ICD-10-CM | POA: Diagnosis not present

## 2022-02-27 DIAGNOSIS — H7092 Unspecified mastoiditis, left ear: Secondary | ICD-10-CM | POA: Diagnosis not present

## 2022-03-01 DIAGNOSIS — G08 Intracranial and intraspinal phlebitis and thrombophlebitis: Secondary | ICD-10-CM | POA: Diagnosis not present

## 2022-03-01 DIAGNOSIS — H7092 Unspecified mastoiditis, left ear: Secondary | ICD-10-CM | POA: Diagnosis not present

## 2022-03-05 DIAGNOSIS — J449 Chronic obstructive pulmonary disease, unspecified: Secondary | ICD-10-CM | POA: Diagnosis not present

## 2022-03-05 DIAGNOSIS — R918 Other nonspecific abnormal finding of lung field: Secondary | ICD-10-CM | POA: Diagnosis not present

## 2022-03-05 DIAGNOSIS — J302 Other seasonal allergic rhinitis: Secondary | ICD-10-CM | POA: Diagnosis not present

## 2022-03-05 DIAGNOSIS — R071 Chest pain on breathing: Secondary | ICD-10-CM | POA: Diagnosis not present

## 2022-03-06 DIAGNOSIS — G08 Intracranial and intraspinal phlebitis and thrombophlebitis: Secondary | ICD-10-CM | POA: Diagnosis not present

## 2022-03-06 DIAGNOSIS — H7092 Unspecified mastoiditis, left ear: Secondary | ICD-10-CM | POA: Diagnosis not present

## 2022-03-07 DIAGNOSIS — H7092 Unspecified mastoiditis, left ear: Secondary | ICD-10-CM | POA: Diagnosis not present

## 2022-03-07 DIAGNOSIS — Z792 Long term (current) use of antibiotics: Secondary | ICD-10-CM | POA: Diagnosis not present

## 2022-03-07 DIAGNOSIS — Z9622 Myringotomy tube(s) status: Secondary | ICD-10-CM | POA: Diagnosis not present

## 2022-03-07 DIAGNOSIS — Z09 Encounter for follow-up examination after completed treatment for conditions other than malignant neoplasm: Secondary | ICD-10-CM | POA: Diagnosis not present

## 2022-03-07 DIAGNOSIS — A499 Bacterial infection, unspecified: Secondary | ICD-10-CM | POA: Diagnosis not present

## 2022-03-07 DIAGNOSIS — G08 Intracranial and intraspinal phlebitis and thrombophlebitis: Secondary | ICD-10-CM | POA: Diagnosis not present

## 2022-03-21 DIAGNOSIS — G08 Intracranial and intraspinal phlebitis and thrombophlebitis: Secondary | ICD-10-CM | POA: Diagnosis not present

## 2022-03-28 DIAGNOSIS — H7192 Unspecified cholesteatoma, left ear: Secondary | ICD-10-CM | POA: Diagnosis not present

## 2022-03-28 DIAGNOSIS — G08 Intracranial and intraspinal phlebitis and thrombophlebitis: Secondary | ICD-10-CM | POA: Diagnosis not present

## 2022-03-28 DIAGNOSIS — H7092 Unspecified mastoiditis, left ear: Secondary | ICD-10-CM | POA: Diagnosis not present

## 2022-03-28 DIAGNOSIS — Z7901 Long term (current) use of anticoagulants: Secondary | ICD-10-CM | POA: Diagnosis not present

## 2022-04-18 DIAGNOSIS — I676 Nonpyogenic thrombosis of intracranial venous system: Secondary | ICD-10-CM | POA: Diagnosis not present

## 2022-04-18 DIAGNOSIS — Z87891 Personal history of nicotine dependence: Secondary | ICD-10-CM | POA: Diagnosis not present

## 2022-04-18 DIAGNOSIS — G039 Meningitis, unspecified: Secondary | ICD-10-CM | POA: Diagnosis not present

## 2022-04-18 DIAGNOSIS — H7192 Unspecified cholesteatoma, left ear: Secondary | ICD-10-CM | POA: Diagnosis not present

## 2022-04-18 DIAGNOSIS — Z87448 Personal history of other diseases of urinary system: Secondary | ICD-10-CM | POA: Diagnosis not present

## 2022-05-25 DIAGNOSIS — Z8551 Personal history of malignant neoplasm of bladder: Secondary | ICD-10-CM | POA: Diagnosis not present

## 2022-05-25 DIAGNOSIS — C678 Malignant neoplasm of overlapping sites of bladder: Secondary | ICD-10-CM | POA: Diagnosis not present

## 2022-06-11 DIAGNOSIS — G08 Intracranial and intraspinal phlebitis and thrombophlebitis: Secondary | ICD-10-CM | POA: Diagnosis not present

## 2022-06-18 DIAGNOSIS — Z7901 Long term (current) use of anticoagulants: Secondary | ICD-10-CM | POA: Diagnosis not present

## 2022-06-18 DIAGNOSIS — H7090 Unspecified mastoiditis, unspecified ear: Secondary | ICD-10-CM | POA: Diagnosis not present

## 2022-06-18 DIAGNOSIS — G08 Intracranial and intraspinal phlebitis and thrombophlebitis: Secondary | ICD-10-CM | POA: Diagnosis not present

## 2022-07-03 DIAGNOSIS — H7092 Unspecified mastoiditis, left ear: Secondary | ICD-10-CM | POA: Diagnosis not present

## 2022-07-03 DIAGNOSIS — H7192 Unspecified cholesteatoma, left ear: Secondary | ICD-10-CM | POA: Diagnosis not present

## 2022-08-06 DIAGNOSIS — J418 Mixed simple and mucopurulent chronic bronchitis: Secondary | ICD-10-CM | POA: Diagnosis not present

## 2022-08-06 DIAGNOSIS — J302 Other seasonal allergic rhinitis: Secondary | ICD-10-CM | POA: Diagnosis not present

## 2022-08-06 DIAGNOSIS — M159 Polyosteoarthritis, unspecified: Secondary | ICD-10-CM | POA: Diagnosis not present

## 2022-08-06 DIAGNOSIS — R0789 Other chest pain: Secondary | ICD-10-CM | POA: Diagnosis not present

## 2022-08-06 DIAGNOSIS — R918 Other nonspecific abnormal finding of lung field: Secondary | ICD-10-CM | POA: Diagnosis not present

## 2022-08-15 DIAGNOSIS — Z7901 Long term (current) use of anticoagulants: Secondary | ICD-10-CM | POA: Diagnosis not present

## 2022-08-15 DIAGNOSIS — H90A32 Mixed conductive and sensorineural hearing loss, unilateral, left ear with restricted hearing on the contralateral side: Secondary | ICD-10-CM | POA: Diagnosis not present

## 2022-08-15 DIAGNOSIS — H7092 Unspecified mastoiditis, left ear: Secondary | ICD-10-CM | POA: Diagnosis not present

## 2022-08-15 DIAGNOSIS — G08 Intracranial and intraspinal phlebitis and thrombophlebitis: Secondary | ICD-10-CM | POA: Diagnosis not present

## 2022-08-15 DIAGNOSIS — Z09 Encounter for follow-up examination after completed treatment for conditions other than malignant neoplasm: Secondary | ICD-10-CM | POA: Diagnosis not present

## 2022-08-15 DIAGNOSIS — H903 Sensorineural hearing loss, bilateral: Secondary | ICD-10-CM | POA: Diagnosis not present

## 2022-08-15 DIAGNOSIS — I82C12 Acute embolism and thrombosis of left internal jugular vein: Secondary | ICD-10-CM | POA: Diagnosis not present

## 2022-08-15 DIAGNOSIS — Z79899 Other long term (current) drug therapy: Secondary | ICD-10-CM | POA: Diagnosis not present

## 2022-08-15 DIAGNOSIS — H7192 Unspecified cholesteatoma, left ear: Secondary | ICD-10-CM | POA: Diagnosis not present

## 2022-08-30 ENCOUNTER — Encounter: Payer: Self-pay | Admitting: *Deleted

## 2022-09-17 DIAGNOSIS — M898X8 Other specified disorders of bone, other site: Secondary | ICD-10-CM | POA: Diagnosis not present

## 2022-09-17 DIAGNOSIS — H9072 Mixed conductive and sensorineural hearing loss, unilateral, left ear, with unrestricted hearing on the contralateral side: Secondary | ICD-10-CM | POA: Diagnosis not present

## 2022-09-17 DIAGNOSIS — H9042 Sensorineural hearing loss, unilateral, left ear, with unrestricted hearing on the contralateral side: Secondary | ICD-10-CM | POA: Diagnosis not present

## 2022-09-17 DIAGNOSIS — Z8661 Personal history of infections of the central nervous system: Secondary | ICD-10-CM | POA: Diagnosis not present

## 2022-09-17 DIAGNOSIS — H7092 Unspecified mastoiditis, left ear: Secondary | ICD-10-CM | POA: Diagnosis not present

## 2022-09-17 DIAGNOSIS — G08 Intracranial and intraspinal phlebitis and thrombophlebitis: Secondary | ICD-10-CM | POA: Diagnosis not present

## 2022-09-17 DIAGNOSIS — Z7901 Long term (current) use of anticoagulants: Secondary | ICD-10-CM | POA: Diagnosis not present

## 2022-09-17 DIAGNOSIS — G039 Meningitis, unspecified: Secondary | ICD-10-CM | POA: Diagnosis not present

## 2022-09-17 DIAGNOSIS — H9041 Sensorineural hearing loss, unilateral, right ear, with unrestricted hearing on the contralateral side: Secondary | ICD-10-CM | POA: Diagnosis not present

## 2022-09-17 DIAGNOSIS — Z86718 Personal history of other venous thrombosis and embolism: Secondary | ICD-10-CM | POA: Diagnosis not present

## 2022-09-17 DIAGNOSIS — H7192 Unspecified cholesteatoma, left ear: Secondary | ICD-10-CM | POA: Diagnosis not present

## 2022-09-17 DIAGNOSIS — H7122 Cholesteatoma of mastoid, left ear: Secondary | ICD-10-CM | POA: Diagnosis not present

## 2022-09-17 DIAGNOSIS — M952 Other acquired deformity of head: Secondary | ICD-10-CM | POA: Diagnosis not present

## 2022-09-24 DIAGNOSIS — Z09 Encounter for follow-up examination after completed treatment for conditions other than malignant neoplasm: Secondary | ICD-10-CM | POA: Diagnosis not present

## 2022-09-24 DIAGNOSIS — H7192 Unspecified cholesteatoma, left ear: Secondary | ICD-10-CM | POA: Diagnosis not present

## 2022-10-19 ENCOUNTER — Emergency Department (HOSPITAL_COMMUNITY)
Admission: EM | Admit: 2022-10-19 | Discharge: 2022-10-19 | Disposition: A | Discharge status: Home / Self Care | Payer: 59 | Attending: Emergency Medicine | Admitting: Emergency Medicine

## 2022-10-19 ENCOUNTER — Other Ambulatory Visit: Payer: Self-pay

## 2022-10-19 ENCOUNTER — Emergency Department (HOSPITAL_BASED_OUTPATIENT_CLINIC_OR_DEPARTMENT_OTHER): Payer: 59

## 2022-10-19 DIAGNOSIS — Z86718 Personal history of other venous thrombosis and embolism: Secondary | ICD-10-CM | POA: Diagnosis not present

## 2022-10-19 DIAGNOSIS — M79662 Pain in left lower leg: Secondary | ICD-10-CM

## 2022-10-19 DIAGNOSIS — Z7901 Long term (current) use of anticoagulants: Secondary | ICD-10-CM | POA: Diagnosis not present

## 2022-10-19 DIAGNOSIS — M79605 Pain in left leg: Secondary | ICD-10-CM | POA: Insufficient documentation

## 2022-10-19 DIAGNOSIS — Z9229 Personal history of other drug therapy: Secondary | ICD-10-CM | POA: Diagnosis not present

## 2022-10-19 NOTE — ED Provider Notes (Signed)
Shelia Berg AT [facility] Provider Note   CSN: [id number] Arrival date & time: 10/19/22  1311     History  Chief Complaint  Patient presents with   Leg Swelling   History obtained through use of [place] interpreter.  Shelia Berg is a 72 y.o. female with history of bladder cancer (resolved) and DVT on eliquis presents with pain in the left lower leg. The pain started yesterday and is located on the medial side of the calf. It is a constant pain. She also feels like the side of her calf is warmer to touch than normal. She does not feel her calf is swollen but notes she has swelling in her left ankle at baseline due to arthritis. She denies any long plane or car rides recently. She denies any shortness of breath, chest pain, fever, chills. Has been taking tylenol without improvement.    HPI     Home Medications Prior to Admission medications   Medication Sig Start Date End Date Taking? Authorizing Provider  aspirin EC 81 MG tablet Take 81 mg by mouth daily.    [provider]  BREO ELLIPTA 100-25 MCG/INH AEPB Inhale 1 application into the lungs daily. 10/27/17   [provider]  gabapentin (NEURONTIN) 300 MG capsule Take 1 capsule (300 mg total) by mouth 3 (three) times daily. Patient not taking: Reported on 04/21/2019 03/20/19   Janeece Agee, NP  ibuprofen (ADVIL,MOTRIN) 800 MG tablet Take 1 tablet (800 mg total) by mouth every 8 (eight) hours as needed. Patient not taking: Reported on 04/21/2019 07/16/18   Kallie Locks, FNP  meloxicam (MOBIC) 7.5 MG tablet Take 1 tablet by mouth daily. 11/04/17   [provider]  mupirocin ointment (BACTROBAN) 2 % APPLY TO AFFECTED AREA TWICE A DAY 01/13/19   [provider]  predniSONE (DELTASONE) 5 MG tablet Take 6 pills for first day, 5 pills second day, 4 pills third day, 3 pills fourth day, 2 pills the fifth day, and 1 pill sixth day. 08/29/21   Myra Rude, MD   valACYclovir (VALTREX) 1000 MG tablet Take 1 tablet (1,000 mg total) by mouth 3 (three) times daily. Patient not taking: Reported on 04/21/2019 01/22/19   Peyton Najjar, MD      Allergies    Contrast media [iodinated contrast media]    Review of Systems   Review of Systems  Constitutional:  Negative for chills and fever.  Cardiovascular:  Negative for chest pain and leg swelling.    Physical Exam Updated Vital Signs BP (!) 154/69 (BP Location: Left Arm)   Pulse 81   Temp 98 F (36.7 C) (Oral)   Resp 18   Ht 5\' 2"  (1.575 m)   Wt 81.6 kg   SpO2 100%   BMI 32.92 kg/m  Physical Exam Vitals and nursing note reviewed.  Constitutional:      General: She is not in acute distress.    Appearance: Normal appearance.  Cardiovascular:     Rate and Rhythm: Normal rate and regular rhythm.     Pulses:          Dorsalis pedis pulses are 1+ on the right side and 1+ on the left side.       Posterior tibial pulses are 1+ on the right side and 1+ on the left side.     Comments: Varicose veins on medial side of left calf. Patient has pain to palpation along these veins  Pulmonary:  Effort: Pulmonary effort is normal.     Breath sounds: Normal breath sounds.  Musculoskeletal:     Right lower leg: No edema.     Left lower leg: No edema.     Comments: No pain upon palpation of calves bilaterally Swelling in left ankle  Skin:    Findings: No erythema, laceration, rash or wound.  Neurological:     Mental Status: She is alert.     ED Results / Procedures / Treatments   Labs (all labs ordered are listed, but only abnormal results are displayed) Labs Reviewed - No data to display  EKG None  Radiology VAS [place] LOWER EXTREMITY VENOUS (DVT)  Result Date: 10/19/2022  Lower Venous DVT Study Patient Name:  Shelia Berg  Date of Exam:   10/19/2022 Medical Rec #: [id number]     Accession #:    [id number] Date of Birth: 1950/05/26     Patient Gender: F Patient Age:   23 years Exam Location:   [facility] Procedure:      VAS [place] LOWER EXTREMITY VENOUS (DVT) Referring Phys: Arabella Merles --------------------------------------------------------------------------------  Indications: Pain, swelling, HX of DVT 35 years ago.  Anticoagulation: Eliquis. Limitations: Poor ultrasound/tissue interface and body habitus. Comparison Study: Previous RLE 07/10/18 negative but limited.                   No previous LLE. Performing Technologist: McKayla Maag RVT, [place]  Examination Guidelines: A complete evaluation includes B-mode imaging, spectral Doppler, color Doppler, and power Doppler as needed of all accessible portions of each vessel. Bilateral testing is considered an integral part of a complete examination. Limited examinations for reoccurring indications may be performed as noted. The reflux portion of the exam is performed with the patient in reverse Trendelenburg.  +-----+---------------+---------+-----------+----------+--------------+ RIGHTCompressibilityPhasicitySpontaneityPropertiesThrombus Aging +-----+---------------+---------+-----------+----------+--------------+ CFV  Full           Yes      Yes                                 +-----+---------------+---------+-----------+----------+--------------+ SFJ  Full                                                        +-----+---------------+---------+-----------+----------+--------------+   +---------+---------------+---------+-----------+----------+--------------+ LEFT     CompressibilityPhasicitySpontaneityPropertiesThrombus Aging +---------+---------------+---------+-----------+----------+--------------+ CFV      Full           Yes      Yes                                 +---------+---------------+---------+-----------+----------+--------------+ SFJ      Full                                                        +---------+---------------+---------+-----------+----------+--------------+ FV Prox  Full                                                         +---------+---------------+---------+-----------+----------+--------------+  FV Mid   Full                                                        +---------+---------------+---------+-----------+----------+--------------+ FV DistalFull                                                        +---------+---------------+---------+-----------+----------+--------------+ PFV      Full                                                        +---------+---------------+---------+-----------+----------+--------------+ POP      Full           Yes      Yes                                 +---------+---------------+---------+-----------+----------+--------------+ PTV      Full                                                        +---------+---------------+---------+-----------+----------+--------------+ PERO                                                  Not visualized +---------+---------------+---------+-----------+----------+--------------+    Summary: RIGHT: - No evidence of common femoral vein obstruction.  LEFT: - There is no evidence of deep vein thrombosis in the lower extremity. However, portions of this examination were limited- see technologist comments above.  - No cystic structure found in the popliteal fossa.  *See table(s) above for measurements and observations.    Preliminary     Procedures Procedures    Medications Ordered in ED Medications - No data to display  ED Course/ Medical Decision Making/ A&P                             Medical Decision Making  72 y.o. female presents to the ED for concern of pain in the medial side of left calf.     Differential diagnosis includes but is not limited to DVT, PAD, phlebitis, arthritis flare. Initially most concerned for DVT or thrombophlebitis given history of previous clots and location of pain along the calf/varicose veins of her left leg. However, [place] of  LLE shows no evidence of clot or phlebitis and she has no fever. PAD less likely as the pain is present at rest. She describes the pain on medial side of calf and not over the ankle where her arthritis is, making an arthritis flare less likely.   External records from outside source obtained and reviewed including urgent care visit from earlier  today.    Upon re-evaluation, patient still resting comfortably.  The patient was discharged home with instructions to use warm compresses and follow up with her PCP or urgent care should the pain not improve within the next 48 hours. She may continue to take tylenol to help with pain. Return precautions given.  She also requested information for a rheumatologist for her arthritis. This information was provided in her discharge paperwork.   Impression: Left medial calf pain, unspecified           Final Clinical Impression(s) / ED Diagnoses Final diagnoses:  None    Rx / DC Orders ED Discharge Orders     None         Arabella Merles, PA-C 10/19/22 Mallie Snooks    Arby Barrette, MD 10/19/22 2358

## 2022-10-19 NOTE — Progress Notes (Signed)
Left lower extremity venous study completed.   Preliminary results relayed to PA in ER.  Please see CV Procedures for preliminary results.  Nikkia Devoss, RVT  4:06 PM 10/19/22

## 2022-10-19 NOTE — Discharge Instructions (Addendum)
The ultrasound did not show any evidence of DVT (clot) in your leg. Rest your leg and use warm compresses. You may continue to take tylenol for pain. Continue to take your eliquis.  Follow up with a primary care provider or an urgent care should symptoms not improve within the next 48 hours.  Return to the ER should you have redness or swelling in your calf, increase in pain, shortness of breath, pain with breathing, or any other symptoms that are concerning to you.    Rheumatology info provided- you can call to make an appointment for your arthritis.    Az ultrahang nem mutatott MVT-t (rgt) a lbban. Pihentesse a lbt s hasznljon meleg borogatst. Fjdalomcsillaptsra Shelia Berg is szedheti a tylenolt. Folytassa az eliquis szedst.   Ha a tnetek a kvetkez? 48 rn bell nem enyhlnek, forduljon egy alapelltst nyjt szolgltathoz vagy egy srg?ssgi elltshoz.   Trjen vissza a srg?ssgi osztlyra, ha a vdli b?rprja vagy duzzanata, fokozdik a fjdalom, lgszomj, lgzsi fjdalom vagy brmilyen egyb, nt rint? tnet.      Reumatolgiai informci rendelkezsre ll - telefonlhat, hogy id?pontot krjen zleti gyulladsra.

## 2022-10-19 NOTE — ED Triage Notes (Signed)
Pt c/o warmth and swelling in L calf. UC referred for VUS. Pt has hx blood clots and takes Eliquis.  Aox4 ambulatory.

## 2022-10-22 ENCOUNTER — Ambulatory Visit (INDEPENDENT_AMBULATORY_CARE_PROVIDER_SITE_OTHER): Payer: 59 | Admitting: Family Medicine

## 2022-10-22 VITALS — BP 120/70 | Ht 64.57 in | Wt 176.4 lb

## 2022-10-22 DIAGNOSIS — M79605 Pain in left leg: Secondary | ICD-10-CM

## 2022-10-22 DIAGNOSIS — M19072 Primary osteoarthritis, left ankle and foot: Secondary | ICD-10-CM | POA: Diagnosis not present

## 2022-10-22 NOTE — Patient Instructions (Addendum)
We will go ahead with some labwork - get this after you leave today (ESR, CRP, RF, ANA). We will see if we can get you in to the rheumatologist sooner. Tylenol 650mg  (arthritis) 1-2 tabs three times a day.  Topical voltaren gel up to 4 times a day. Boswellia extract or curcumin can help with pain as well.

## 2022-10-22 NOTE — Progress Notes (Unsigned)
PCP: Patient, [name]  Subjective:   HPI: Patient is a 72 y.o. female here for arthritis evaluation. She has a longstanding history of known OA of the L knee and ankle.  Last imaging of the ankle was in April 2023 which showed a degenerative spur of the medial aspect of the medial malleolus mild talonavicular joint space narrowing and mild midfoot OA in addition to plantar and posterior calcaneal spurs.  A limited ultrasound of the joint in 11/22 showed mild spurring of the tibiotalar joint.   She is seen today for worsening of her pain.  She was actually seen in the ED 2 days ago due to an increase swelling in her ankle which was concerning for possible DVT as she has a history of both DVT and dural venous sinus thrombosis.  Thankfully, DVT ultrasound was negative in the ED and she was discharged home with instructions for PCP follow-up.  She does not have a PCP but receives most of her care from her pulmonologist Dr. Darcella Gasman at [facility].  Dr. Darcella Gasman had previously recommended that she be seen by a rheumatologist.  She has been calling around to try and get a rheumatology appointment but the next available appointments are not until late this fall.  She is therefore here today to discuss options for medical management.  She was previously well-controlled on meloxicam but has been told to stop this due to her also being on Eliquis for her DVT history and the significant bleeding risk of taking the 2 together. She does take Tylenol but it is of minimal help.  She describes joint pain "everywhere" else in her body including her hands, wrists, shoulders.  Past Medical History:  Diagnosis Date   Arthritis    Bladder cancer (HCC) 6 years ago   History of DVT (deep vein thrombosis)    Infected traumatic leg ulcer, left, limited to breakdown of skin (HCC)    Lung nodule, multiple 04/21/2019   Shingles 04/10/2019    Current Outpatient Medications on File Prior to Visit  Medication Sig Dispense  Refill   aspirin EC 81 MG tablet Take 81 mg by mouth daily.     BREO ELLIPTA 100-25 MCG/INH AEPB Inhale 1 application into the lungs daily.  5   gabapentin (NEURONTIN) 300 MG capsule Take 1 capsule (300 mg total) by mouth 3 (three) times daily. (Patient not taking: Reported on 04/21/2019) 90 capsule 3   predniSONE (DELTASONE) 5 MG tablet Take 6 pills for first day, 5 pills second day, 4 pills third day, 3 pills fourth day, 2 pills the fifth day, and 1 pill sixth day. 21 tablet 0   No current facility-administered medications on file prior to visit.    Past Surgical History:  Procedure Laterality Date   ABDOMINAL HYSTERECTOMY      Allergies  Allergen Reactions   Contrast Media [Iodinated Contrast Media] Anaphylaxis    RESPIRATORY ARREST/Patient stopped breathing    BP 120/70   Ht 5' 4.57" (1.64 m)   Wt 176 lb 5.9 oz (80 kg)   BMI 29.74 kg/m       No data to display              No data to display              Objective:  Physical Exam:  Gen: NAD, comfortable in exam room  L ankle: Diffuse edema to the L ankle without warmth or bruising. She has anterior joint line tenderness over  the tibiotalar joint. No malleolar, base 5th, navicular tenderness.  FROM without crepitus. Negative ant drawer and talar tilt. Syndesmotic squeeze test negative. Neurovascularly intact distally.    Assessment & Plan:  1. L ankle pain and swelling - likely related to OA, though cannot definitively rule out other causes such as rheumatoid arthritis.  No evidence of gout on exam.  Limited ultrasound today showed spurring of the tibiotalar joint without evidence of a large effusion.  The severity of her symptoms is somewhat greater than would be expected based off of her imaging, therefore will initiate a lab workup for possible rheumatologic involvement and also support the referral to rheumatology.  She did have an elevated ESR of 74 in October of last year but was acutely ill with mastoiditis  at the time so it is impossible to discern what, if any relationship this has to her ankle.  Limited in medication options for OA due to her being on Eliquis and thus not a candidate for long-term NSAIDs. -Recommend 3 times daily Tylenol -ESR, CRP, RF, ANA -Will see if we can get her into a rheumatologist sooner than late fall -Topical Voltaren might be a great option for her -Boswellia or curcumin may be helpful as well  Dorothyann Gibbs PGY-2

## 2022-10-23 LAB — SEDIMENTATION RATE: Sed Rate: 27 mm/hr (ref 0–40)

## 2022-10-23 LAB — RHEUMATOID FACTOR: Rheumatoid fact SerPl-aCnc: 11.5 IU/mL (ref <14.0)

## 2022-10-23 LAB — ANA: Anti Nuclear Antibody (ANA): NEGATIVE

## 2022-10-23 LAB — C-REACTIVE PROTEIN: CRP: 10 mg/L (ref 0–10)

## 2022-10-25 DIAGNOSIS — H7192 Unspecified cholesteatoma, left ear: Secondary | ICD-10-CM | POA: Diagnosis not present

## 2022-10-25 DIAGNOSIS — Z09 Encounter for follow-up examination after completed treatment for conditions other than malignant neoplasm: Secondary | ICD-10-CM | POA: Diagnosis not present

## 2022-10-29 DIAGNOSIS — M79641 Pain in right hand: Secondary | ICD-10-CM | POA: Diagnosis not present

## 2022-10-29 DIAGNOSIS — M79642 Pain in left hand: Secondary | ICD-10-CM | POA: Diagnosis not present

## 2022-10-29 DIAGNOSIS — M1991 Primary osteoarthritis, unspecified site: Secondary | ICD-10-CM | POA: Diagnosis not present

## 2023-04-18 ENCOUNTER — Encounter: Payer: 59 | Admitting: Internal Medicine

## 2023-10-24 IMAGING — DX DG ANKLE COMPLETE 3+V*L*
3 series · 3 of 3 positions shown · non-contrast
Comparison: None.

CLINICAL DATA: Entire left ankle pain x 4 days.

EXAM:
LEFT ANKLE COMPLETE - 3+ VIEW

[ankle ap]
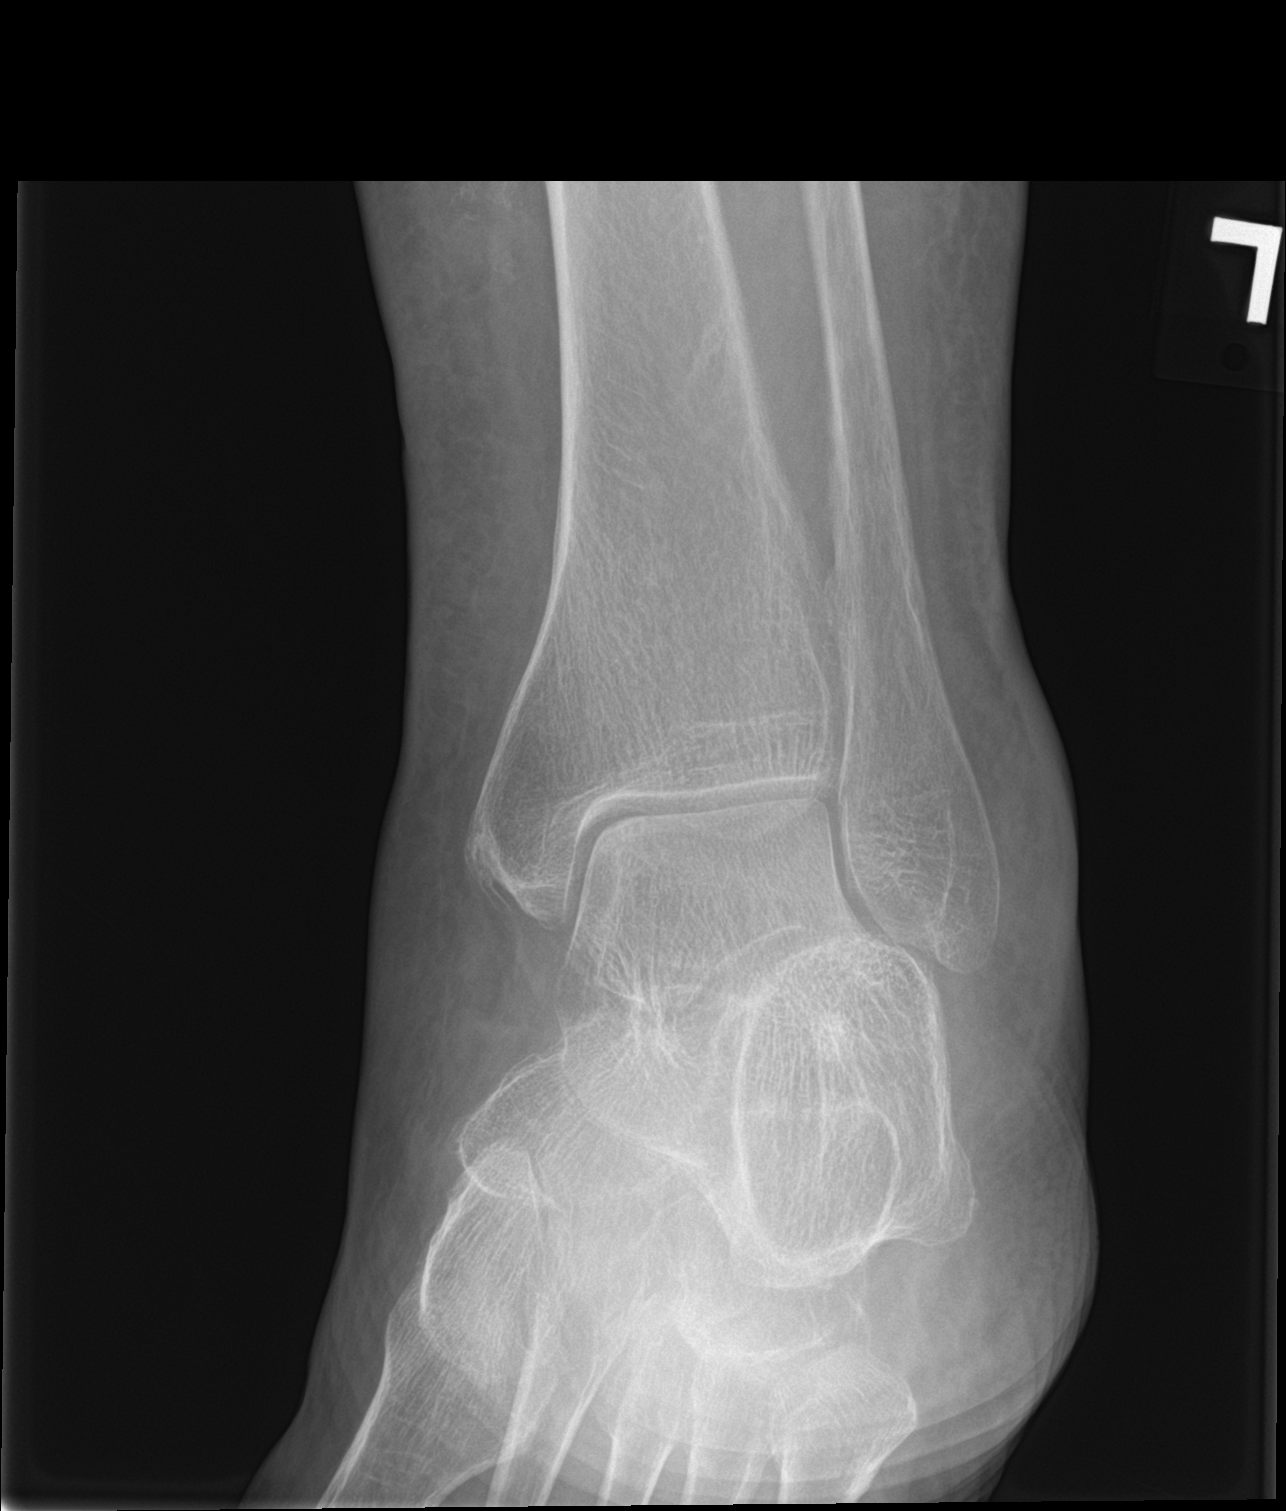

[ankle obl]
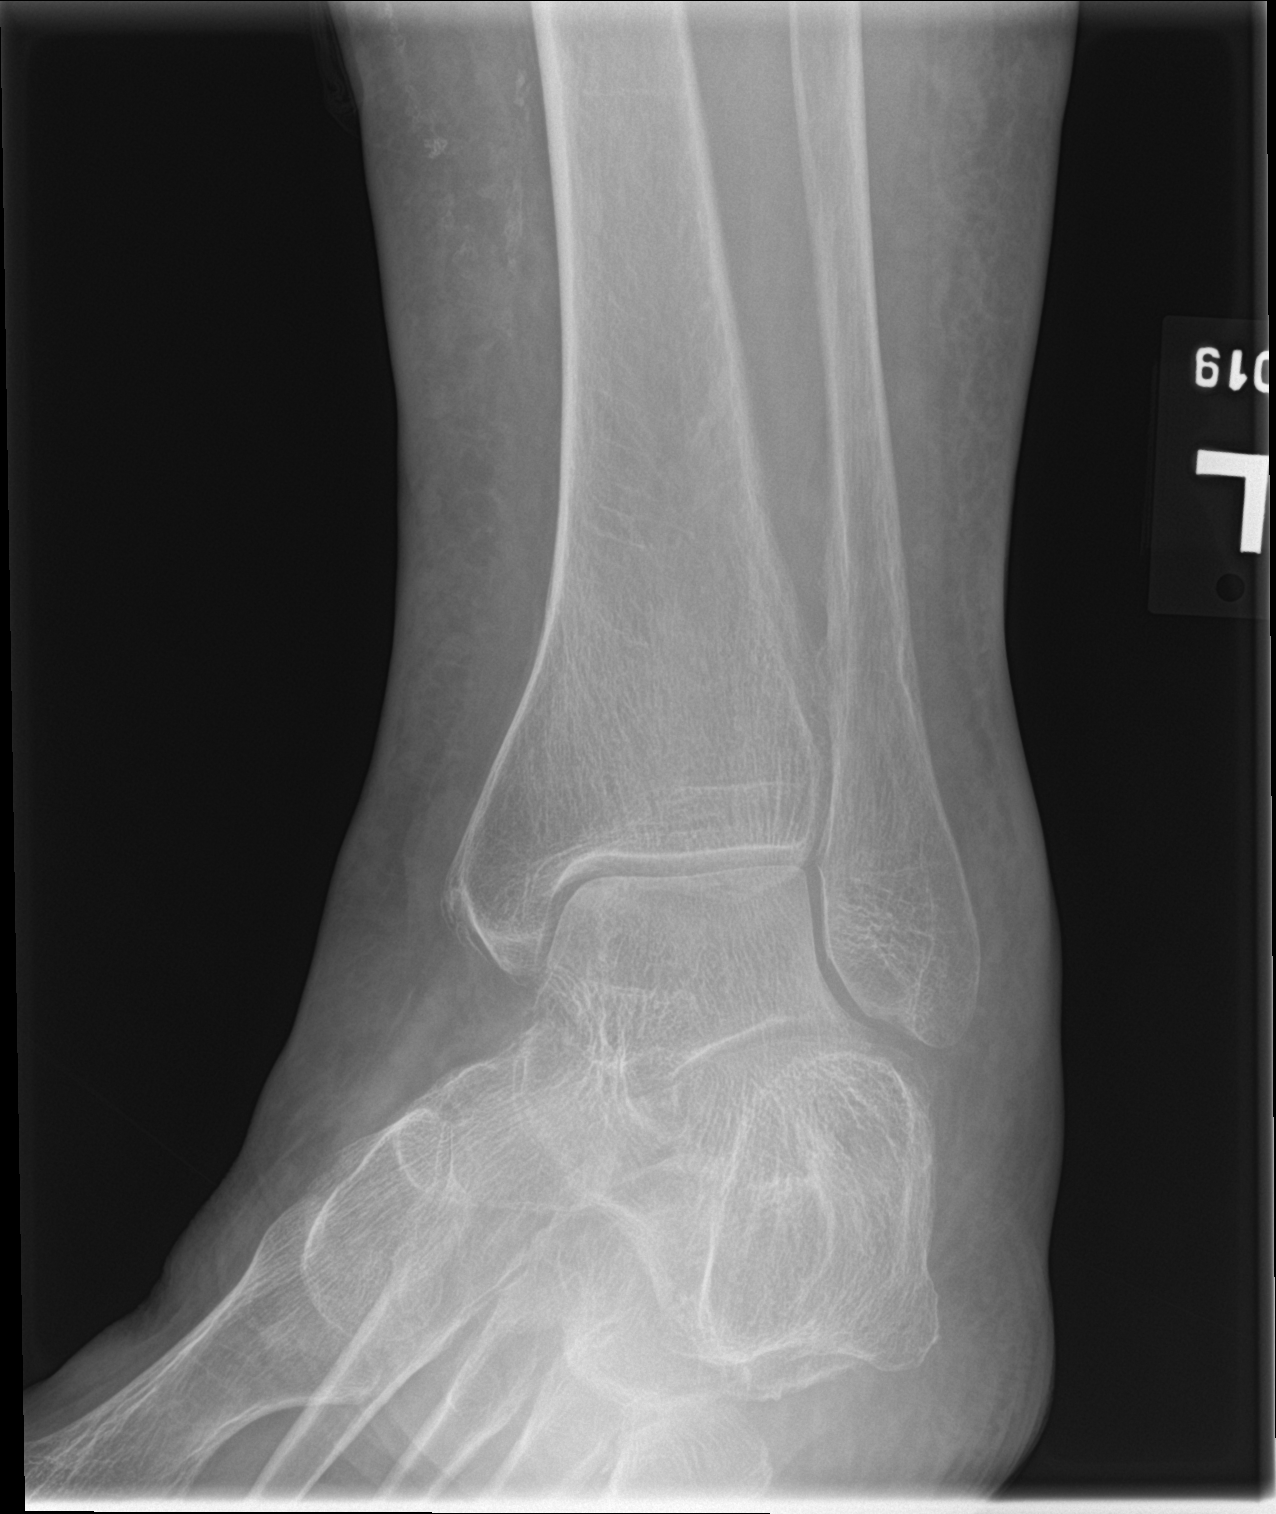

[ankle lat]
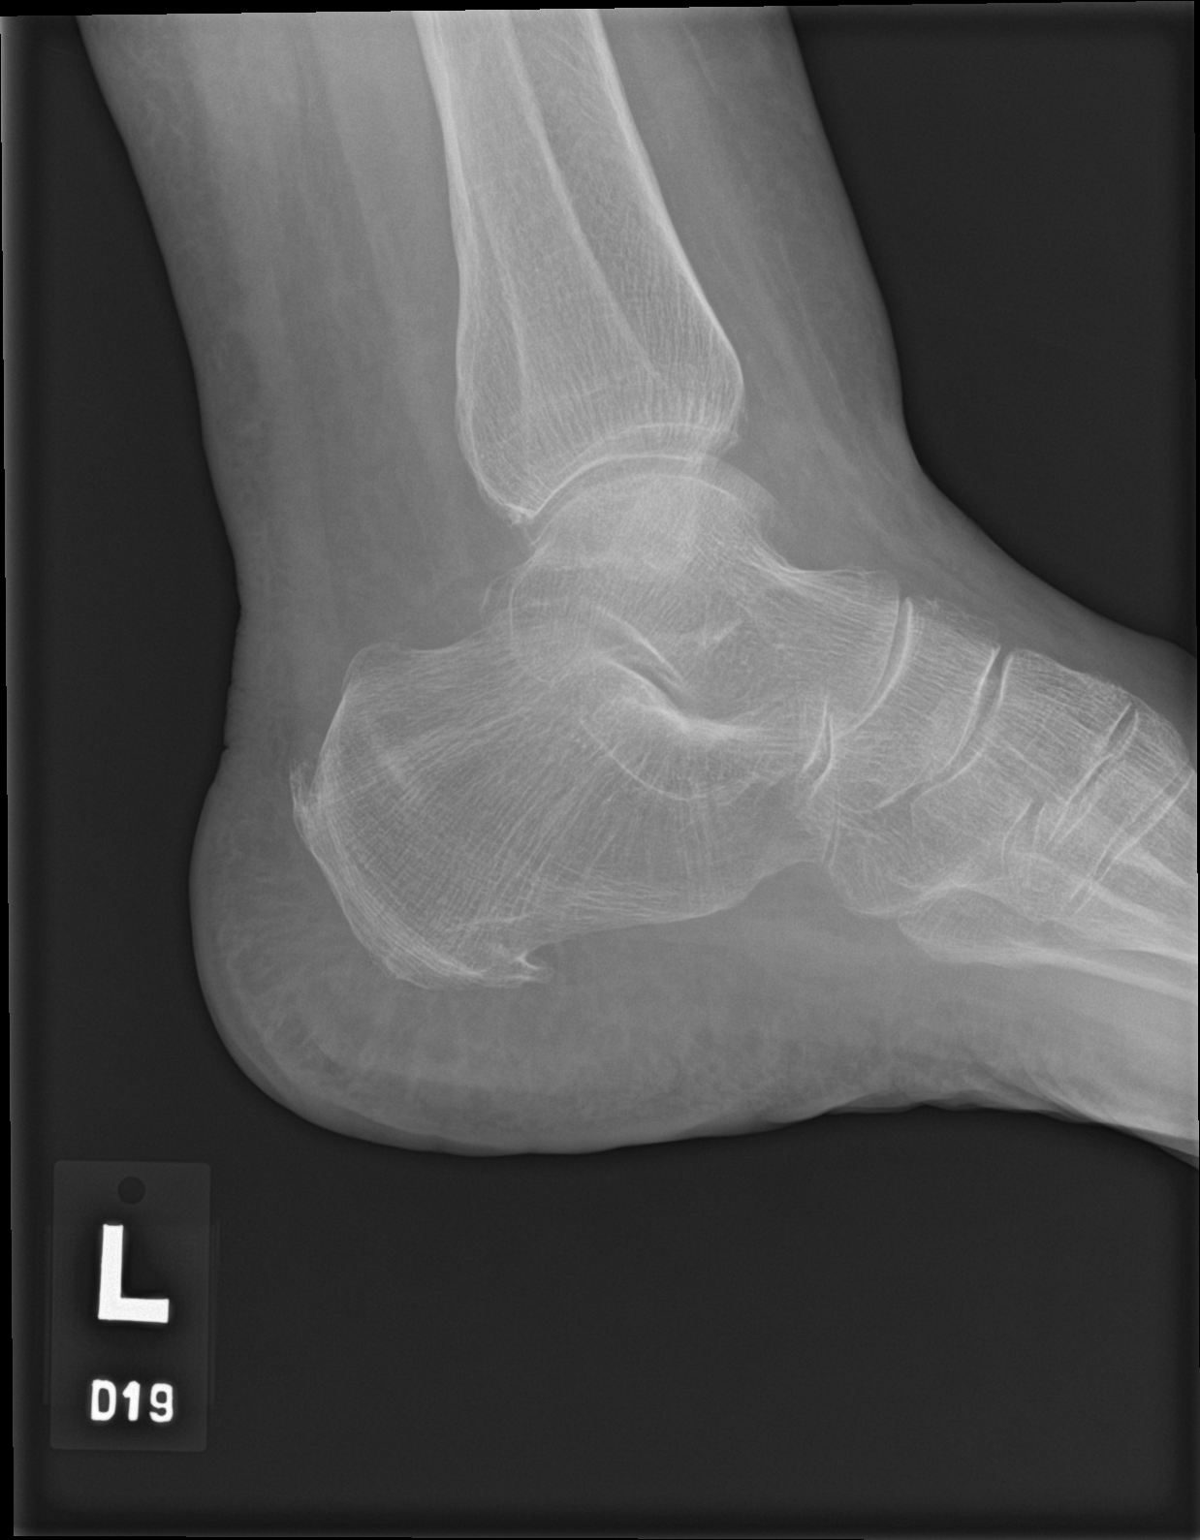

[3 of 3 positions shown; findings below may reference images not displayed]

FINDINGS: Mild chronic plantar calcaneal heel spur. Mild chronic enthesopathic
change at the Achilles insertion on the calcaneus. Mild medial
aspect of the medial malleolus inferiorly directed degenerative
spur. The ankle mortise is symmetric and intact. Mild talonavicular
joint space narrowing. No acute fracture or dislocation.
IMPRESSION: :
IMPRESSION: 1. Chronic plantar and posterior calcaneal heel spurs.
2. Mild midfoot osteoarthritis.

## 2023-12-02 ENCOUNTER — Ambulatory Visit: Admission: EM | Admit: 2023-12-02 | Discharge: 2023-12-02 | Disposition: A | Discharge status: Home / Self Care

## 2023-12-02 DIAGNOSIS — M546 Pain in thoracic spine: Secondary | ICD-10-CM | POA: Diagnosis not present

## 2023-12-02 NOTE — ED Provider Notes (Signed)
 [facility]    CSN: [id number] Arrival date & time: 12/02/23  1437      History   Chief Complaint Chief Complaint  Patient presents with   Back Pain    HPI Shelia Berg is a 73 y.o. female.   HPI Patient is a 73 year old female who presents to the urgent care today with concerns of left thoracic back pain.  She reports her symptoms began about a week ago.  She has been using Voltaren gel and massage to try and help with her symptoms.  She denies any specific injury.  She takes hydrocortisone which her rheumatologist prescribed for her.  She denies any loss of sensation, fever, rash, vomiting, chest pain, shortness of breath, tearing sensation, or other concerns at this time. Past Medical History:  Diagnosis Date   Arthritis    Bladder cancer (HCC) 6 years ago   History of DVT (deep vein thrombosis)    Infected traumatic leg ulcer, left, limited to breakdown of skin (HCC)    Lung nodule, multiple 04/21/2019   Shingles 04/10/2019    Patient Active Problem List   Diagnosis Date Noted   Arthritis of ankle or foot, degenerative, left 08/29/2021   Lung nodule, multiple 04/21/2019   Shingles 04/10/2019   Bladder cancer (HCC) 10/14/2018   Acute pharyngitis 10/08/2018   Ulcer of right lower extremity (HCC) 07/03/2018   Cough 12/20/2016   Acute bronchitis 12/20/2016   Pleuritic chest pain 03/05/2016   Pleurisy with effusion 03/05/2016   Tobacco dependence in remission 03/05/2016   Hyperkalemia 08/04/2015   Hypokalemia 06/07/2014   Osteoarthritis 10/03/2006   Closed fracture of carpal bone 05/20/2001   HX, PERSONAL, VENOUS THROMBOSIS/EMBOLISM 05/15/1987   Acquired absence of genital organ 05/14/1986    Past Surgical History:  Procedure Laterality Date   ABDOMINAL HYSTERECTOMY      OB History   No obstetric history on file.      Home Medications    Prior to Admission medications   Medication Sig Start Date End Date Taking? Authorizing Provider  aspirin   EC 81 MG tablet Take 81 mg by mouth daily.    [provider]  BREO ELLIPTA 100-25 MCG/INH AEPB Inhale 1 application into the lungs daily. 10/27/17   [provider]  gabapentin  (NEURONTIN ) 300 MG capsule Take 1 capsule (300 mg total) by mouth 3 (three) times daily. Patient not taking: Reported on 04/21/2019 03/20/19   Kip Ade, NP  predniSONE  (DELTASONE ) 5 MG tablet Take 6 pills for first day, 5 pills second day, 4 pills third day, 3 pills fourth day, 2 pills the fifth day, and 1 pill sixth day. 08/29/21   Chick Venetia BRAVO, MD    Family History History reviewed. No pertinent family history.  Social History Social History   Tobacco Use   Smoking status: Former    Current packs/day: 0.00    Average packs/day: 0.5 packs/day for 30.0 years (15.0 ttl pk-yrs)    Types: Cigarettes    Start date: 06/05/1984    Quit date: 06/05/2014    Years since quitting: 9.4   Smokeless tobacco: Never  Substance Use Topics   Alcohol use: No    Alcohol/week: 0.0 standard drinks of alcohol   Drug use: No     Allergies   Contrast media [iodinated contrast media]   Review of Systems Review of Systems See HPI for relevant ROS.  Physical Exam Triage Vital Signs ED Triage Vitals  Encounter Vitals Group     BP  12/02/23 1544 137/73     Girls Systolic BP Percentile --      Girls Diastolic BP Percentile --      Boys Systolic BP Percentile --      Boys Diastolic BP Percentile --      Pulse Rate 12/02/23 1544 78     Resp 12/02/23 1544 18     Temp 12/02/23 1544 98.5 F (36.9 C)     Temp Source 12/02/23 1544 Oral     SpO2 12/02/23 1544 96 %     Weight --      Height --      Head Circumference --      Peak Flow --      Pain Score 12/02/23 1542 7     Pain Loc --      Pain Education --      Exclude from Growth Chart --    No data found.  Updated Vital Signs BP 137/73 (BP Location: Right Arm)   Pulse 78   Temp 98.5 F (36.9 C) (Oral)   Resp 18   SpO2 96%   Visual  Acuity Right Eye Distance:   Left Eye Distance:   Bilateral Distance:    Right Eye Near:   Left Eye Near:    Bilateral Near:     Physical Exam General: Alert and oriented, well-developed/well-nourished, calm, cooperative, no acute distress HEENT: Normocephalic atraumatic, moist mucous membranes, no scleral icterus, trachea midline Lungs: Speaking full sentences, non-labored respirations, no distress, clear to auscultation bilaterally Heart: Regular rate and rhythm Abdomen:  Soft, nondistended Musculoskeletal: Moves all extremities well, full and equal strength bilaterally in the upper extremities, tenderness to palpation of the inferior left scapula Neurologic: Awake, A&O x4, gait normal, full sensation to light touch in the upper extremities Back: No CVA tenderness Integumentary: Warm, dry, normal for ethnicity, intact, no rash Psychiatric: Appropriate mood & affect  UC Treatments / Results  Labs (all labs ordered are listed, but only abnormal results are displayed) Labs Reviewed - No data to display  EKG   Radiology No results found.  Procedures Procedures (including critical care time)  Medications Ordered in UC Medications - No data to display  Initial Impression / Assessment and Plan / UC Course  I have reviewed the triage vital signs and the nursing notes.  Pertinent labs & imaging results that were available during my care of the patient were reviewed by me and considered in my medical decision making (see chart for details).    Presents with left-sided back pain.  Differential diagnosis includes: Strain, sprain, disc herniation, spondylolisthesis, cauda equina, spinal abscess, spinal stenosis, pyelonephritis, nephrolithiasis, AAA, meningitis, malignancy, shingles, including other diagnoses.   History obtained from: Patient.  Plan: Presents with back pain. No back pain red flags on history or physical. Presentation not consistent with malignancy (lack of  history of malignancy, lack of B symptoms), fracture (no trauma, no bony tenderness to palpation), cauda equina (no bowel or urinary incontinence/retention, no saddle anesthesia, no distal weakness), AAA, viscus perforation, osteomyelitis or epidural abscess (no IVDU, vertebral tenderness), renal colic, pyelonephritis (afebrile, no CVAT, no urinary symptoms). Given the clinical picture, no indication for imaging at this time.  Pain is reproducible to palpation. Patient can take Tylenol  as needed for pain relief.  Patient should follow-up with their primary care provider in the next several days.  Strict return precautions discussed including neurologic symptoms, fever, worsening of symptoms, loss of sensation, urinary incontinence/retention, distal weakness, or if patient has  any other concerns.  Disposition: Stable to discharge home.  All questions answered to the best of this examiner's ability. Advised to f/u with PCP for further eval and/or reassessment. Patient agrees to plan.  An appropriate evaluation has been performed, and in my medical judgment there is currently no evidence of an immediate life-threatening or surgical condition. Discharge is therefore indicated at this time.  This document was created using the aid of voice recognition Scientist, clinical (histocompatibility and immunogenetics).  Final Clinical Impressions(s) / UC Diagnoses   Final diagnoses:  Acute left-sided thoracic back pain     Discharge Instructions      Please follow-up with your primary care provider in the next week if symptoms are not improving.  Additionally, they can make a referral for you to get a mammogram.  They can also make a referral for you to follow-up with physical therapy if symptoms are not improving.  You can take Tylenol , use lidocaine patches, use topical creams, and heat to help with your symptoms.  Please return or go to the emergency department if you develop any chest pain, shortness of breath, loss of sensation, fever,  or if you have any other concerns.    ED Prescriptions   None    PDMP not reviewed this encounter.   Melonie Locus, PA-C 12/02/23 1741

## 2023-12-02 NOTE — ED Triage Notes (Signed)
 Pt present with c/o back pain x one week. Pt states the pain starts at the lt side near the shoulder blade and radiates down to the lower left of her back. States the pain worsens when breathing in.

## 2023-12-02 NOTE — Discharge Instructions (Signed)
 Please follow-up with your primary care provider in the next week if symptoms are not improving.  Additionally, they can make a referral for you to get a mammogram.  They can also make a referral for you to follow-up with physical therapy if symptoms are not improving.  You can take Tylenol , use lidocaine patches, use topical creams, and heat to help with your symptoms.  Please return or go to the emergency department if you develop any chest pain, shortness of breath, loss of sensation, fever, or if you have any other concerns.

## 2024-01-15 ENCOUNTER — Other Ambulatory Visit: Payer: Self-pay | Admitting: Family Medicine

## 2024-01-15 DIAGNOSIS — Z8589 Personal history of malignant neoplasm of other organs and systems: Secondary | ICD-10-CM

## 2024-01-29 ENCOUNTER — Other Ambulatory Visit: Payer: Self-pay | Admitting: Family Medicine

## 2024-01-29 DIAGNOSIS — Z8589 Personal history of malignant neoplasm of other organs and systems: Secondary | ICD-10-CM

## 2024-01-29 DIAGNOSIS — D360 Benign neoplasm of lymph nodes: Secondary | ICD-10-CM

## 2024-01-29 DIAGNOSIS — D4989 Neoplasm of unspecified behavior of other specified sites: Secondary | ICD-10-CM

## 2024-02-04 ENCOUNTER — Ambulatory Visit
Admission: RE | Admit: 2024-02-04 | Discharge: 2024-02-04 | Disposition: A | Discharge status: Home / Self Care | Source: Ambulatory Visit | Attending: Family Medicine | Admitting: Family Medicine

## 2024-02-04 DIAGNOSIS — D360 Benign neoplasm of lymph nodes: Secondary | ICD-10-CM

## 2024-05-08 ENCOUNTER — Other Ambulatory Visit: Payer: Self-pay | Admitting: Student

## 2024-05-08 DIAGNOSIS — Z87891 Personal history of nicotine dependence: Secondary | ICD-10-CM

## 2024-05-15 ENCOUNTER — Inpatient Hospital Stay: Admission: RE | Admit: 2024-05-15 | Source: Ambulatory Visit
